# Patient Record
Sex: Female | Born: 1945 | Race: Black or African American | Hispanic: No | Marital: Married | Smoking: Never smoker
Health system: Southern US, Community
[De-identification: ages and names within clinical notes are randomized; demographics above are authoritative.]

## PROBLEM LIST (undated history)

## (undated) ENCOUNTER — Ambulatory Visit: Admission: EM | Payer: Self-pay | Source: Home / Self Care

## (undated) DIAGNOSIS — F32A Depression, unspecified: Secondary | ICD-10-CM

## (undated) DIAGNOSIS — M329 Systemic lupus erythematosus, unspecified: Secondary | ICD-10-CM

## (undated) DIAGNOSIS — E785 Hyperlipidemia, unspecified: Secondary | ICD-10-CM

## (undated) DIAGNOSIS — IMO0002 Reserved for concepts with insufficient information to code with codable children: Secondary | ICD-10-CM

## (undated) DIAGNOSIS — H3320 Serous retinal detachment, unspecified eye: Secondary | ICD-10-CM

## (undated) DIAGNOSIS — H3560 Retinal hemorrhage, unspecified eye: Secondary | ICD-10-CM

## (undated) DIAGNOSIS — I10 Essential (primary) hypertension: Secondary | ICD-10-CM

## (undated) DIAGNOSIS — K5792 Diverticulitis of intestine, part unspecified, without perforation or abscess without bleeding: Secondary | ICD-10-CM

## (undated) DIAGNOSIS — F329 Major depressive disorder, single episode, unspecified: Secondary | ICD-10-CM

## (undated) DIAGNOSIS — F419 Anxiety disorder, unspecified: Secondary | ICD-10-CM

## (undated) DIAGNOSIS — S82899A Other fracture of unspecified lower leg, initial encounter for closed fracture: Secondary | ICD-10-CM

## (undated) HISTORY — DX: Other fracture of unspecified lower leg, initial encounter for closed fracture: S82.899A

## (undated) HISTORY — PX: EYE SURGERY: SHX253

## (undated) HISTORY — PX: ABDOMINAL HYSTERECTOMY: SHX81

## (undated) HISTORY — PX: RETINAL DETACHMENT SURGERY: SHX105

## (undated) HISTORY — DX: Depression, unspecified: F32.A

## (undated) HISTORY — PX: CATARACT EXTRACTION: SUR2

## (undated) HISTORY — PX: BREAST SURGERY: SHX581

## (undated) HISTORY — PX: BREAST BIOPSY: SHX20

## (undated) HISTORY — DX: Systemic lupus erythematosus, unspecified: M32.9

## (undated) HISTORY — DX: Retinal hemorrhage, unspecified eye: H35.60

## (undated) HISTORY — DX: Serous retinal detachment, unspecified eye: H33.20

## (undated) HISTORY — DX: Reserved for concepts with insufficient information to code with codable children: IMO0002

## (undated) HISTORY — DX: Major depressive disorder, single episode, unspecified: F32.9

## (undated) HISTORY — DX: Anxiety disorder, unspecified: F41.9

## (undated) HISTORY — PX: HERNIA REPAIR: SHX51

---

## 2008-11-13 LAB — HEMOCCULT SLIDES (X 3 CARDS)
OCCULT 1: NEGATIVE
OCCULT 2: NEGATIVE

## 2014-05-08 LAB — HM COLONOSCOPY

## 2016-05-20 LAB — HM MAMMOGRAPHY

## 2016-06-07 ENCOUNTER — Encounter: Payer: Self-pay | Admitting: Adult Health

## 2016-06-08 LAB — HEPATIC FUNCTION PANEL
ALK PHOS: 86 (ref 25–125)
ALT: 16 (ref 7–35)
AST: 20 (ref 13–35)
BILIRUBIN, TOTAL: 0.3

## 2016-06-08 LAB — BASIC METABOLIC PANEL
BUN: 18 (ref 4–21)
CREATININE: 1.1 (ref 0.5–1.1)
GLUCOSE: 91
Potassium: 3.7 (ref 3.4–5.3)
SODIUM: 139 (ref 137–147)

## 2016-06-08 LAB — LIPID PANEL
Cholesterol: 235 — AB (ref 0–200)
HDL: 56 (ref 35–70)
LDL Cholesterol: 152
TRIGLYCERIDES: 134 (ref 40–160)

## 2016-09-16 LAB — COMPLETE METABOLIC PANEL WITH GFR
ALK PHOS: 89
ALT: 19
AST: 34
BUN: 23
CREATININE: 1.26
Glucose: 104
POTASSIUM: 4.6
SODIUM: 139
Total Bilirubin: 0.4

## 2016-09-16 LAB — LIPID PANEL
CHOLESTEROL, TOTAL: 243
HDL: 60
LDL: 160
Triglyceride fasting, serum: 115

## 2017-04-19 NOTE — Progress Notes (Deleted)
   Subjective:    Patient ID: Dawn Cox, female    DOB: [birth year], 72 y.o.   MRN: [id number]  HPI:  [name] is here to establish as a new pt.  She is a pleasant 72 year old female.  PMH:     Review of Systems     Objective:   Physical Exam        Assessment & Plan:

## 2017-04-21 ENCOUNTER — Ambulatory Visit: Payer: Self-pay | Admitting: Adult Health

## 2017-04-30 ENCOUNTER — Emergency Department (HOSPITAL_COMMUNITY)
Admission: EM | Admit: 2017-04-30 | Discharge: 2017-05-01 | Disposition: A | Discharge status: Home / Self Care | Payer: Federal, State, Local not specified - PPO | Attending: Emergency Medicine | Admitting: Emergency Medicine

## 2017-04-30 ENCOUNTER — Encounter (HOSPITAL_COMMUNITY): Payer: Self-pay | Admitting: Nurse Practitioner

## 2017-04-30 DIAGNOSIS — Z5321 Procedure and treatment not carried out due to patient leaving prior to being seen by health care provider: Secondary | ICD-10-CM | POA: Diagnosis not present

## 2017-04-30 DIAGNOSIS — R51 Headache: Secondary | ICD-10-CM | POA: Insufficient documentation

## 2017-04-30 HISTORY — DX: Diverticulitis of intestine, part unspecified, without perforation or abscess without bleeding: K57.92

## 2017-04-30 HISTORY — DX: Essential (primary) hypertension: I10

## 2017-04-30 HISTORY — DX: Hyperlipidemia, unspecified: E78.5

## 2017-04-30 NOTE — ED Notes (Signed)
Pt. Called x 2 from triage for bed in the back.

## 2017-04-30 NOTE — ED Triage Notes (Signed)
Pt is c/o URI Sx in addition to HA, body aches, intermittent fever and chills. Requesting to evaluated for flu.

## 2017-05-01 LAB — RESPIRATORY PANEL BY PCR
Adenovirus: NOT DETECTED
Bordetella pertussis: NOT DETECTED
CHLAMYDOPHILA PNEUMONIAE-RVPPCR: NOT DETECTED
CORONAVIRUS NL63-RVPPCR: DETECTED — AB
Coronavirus 229E: NOT DETECTED
Coronavirus HKU1: NOT DETECTED
Coronavirus OC43: NOT DETECTED
INFLUENZA A H1-RVPPCR: NOT DETECTED
INFLUENZA B-RVPPCR: NOT DETECTED
Influenza A H1 2009: NOT DETECTED
Influenza A H3: NOT DETECTED
Influenza A: NOT DETECTED
Metapneumovirus: NOT DETECTED
Mycoplasma pneumoniae: NOT DETECTED
PARAINFLUENZA VIRUS 3-RVPPCR: NOT DETECTED
PARAINFLUENZA VIRUS 4-RVPPCR: NOT DETECTED
Parainfluenza Virus 1: NOT DETECTED
Parainfluenza Virus 2: NOT DETECTED
RESPIRATORY SYNCYTIAL VIRUS-RVPPCR: NOT DETECTED
RHINOVIRUS / ENTEROVIRUS - RVPPCR: NOT DETECTED

## 2017-05-04 ENCOUNTER — Encounter (HOSPITAL_COMMUNITY): Payer: Self-pay | Admitting: Emergency Medicine

## 2017-05-04 ENCOUNTER — Emergency Department (HOSPITAL_COMMUNITY)
Admission: EM | Admit: 2017-05-04 | Discharge: 2017-05-04 | Disposition: A | Discharge status: Home / Self Care | Payer: Federal, State, Local not specified - PPO | Attending: Emergency Medicine | Admitting: Emergency Medicine

## 2017-05-04 ENCOUNTER — Other Ambulatory Visit: Payer: Self-pay

## 2017-05-04 ENCOUNTER — Emergency Department (HOSPITAL_COMMUNITY): Payer: Federal, State, Local not specified - PPO

## 2017-05-04 DIAGNOSIS — R0789 Other chest pain: Secondary | ICD-10-CM | POA: Diagnosis not present

## 2017-05-04 DIAGNOSIS — R51 Headache: Secondary | ICD-10-CM | POA: Insufficient documentation

## 2017-05-04 DIAGNOSIS — Z79899 Other long term (current) drug therapy: Secondary | ICD-10-CM | POA: Insufficient documentation

## 2017-05-04 DIAGNOSIS — Z91040 Latex allergy status: Secondary | ICD-10-CM | POA: Diagnosis not present

## 2017-05-04 DIAGNOSIS — R0982 Postnasal drip: Secondary | ICD-10-CM | POA: Diagnosis not present

## 2017-05-04 DIAGNOSIS — R05 Cough: Secondary | ICD-10-CM | POA: Diagnosis present

## 2017-05-04 DIAGNOSIS — R6883 Chills (without fever): Secondary | ICD-10-CM | POA: Diagnosis not present

## 2017-05-04 DIAGNOSIS — R0981 Nasal congestion: Secondary | ICD-10-CM | POA: Insufficient documentation

## 2017-05-04 DIAGNOSIS — J209 Acute bronchitis, unspecified: Secondary | ICD-10-CM | POA: Insufficient documentation

## 2017-05-04 DIAGNOSIS — I10 Essential (primary) hypertension: Secondary | ICD-10-CM | POA: Diagnosis not present

## 2017-05-04 LAB — BASIC METABOLIC PANEL
Anion gap: 9 (ref 5–15)
BUN: 20 mg/dL (ref 6–20)
CALCIUM: 9 mg/dL (ref 8.9–10.3)
CO2: 26 mmol/L (ref 22–32)
CREATININE: 1.24 mg/dL — AB (ref 0.44–1.00)
Chloride: 105 mmol/L (ref 101–111)
GFR calc Af Amer: 49 mL/min — ABNORMAL LOW (ref >60)
GFR, EST NON AFRICAN AMERICAN: 43 mL/min — AB (ref >60)
Glucose, Bld: 104 mg/dL — ABNORMAL HIGH (ref 65–99)
POTASSIUM: 4.8 mmol/L (ref 3.5–5.1)
SODIUM: 140 mmol/L (ref 135–145)

## 2017-05-04 LAB — CBC WITH DIFFERENTIAL/PLATELET
BASOS ABS: 0 10*3/uL (ref 0.0–0.1)
BASOS PCT: 1 %
EOS ABS: 0.1 10*3/uL (ref 0.0–0.7)
EOS PCT: 2 %
HCT: 34.7 % — ABNORMAL LOW (ref 36.0–46.0)
Hemoglobin: 11.5 g/dL — ABNORMAL LOW (ref 12.0–15.0)
LYMPHS PCT: 60 %
Lymphs Abs: 2.8 10*3/uL (ref 0.7–4.0)
MCH: 28.8 pg (ref 26.0–34.0)
MCHC: 33.1 g/dL (ref 30.0–36.0)
MCV: 87 fL (ref 78.0–100.0)
MONO ABS: 0.3 10*3/uL (ref 0.1–1.0)
Monocytes Relative: 7 %
Neutro Abs: 1.4 10*3/uL — ABNORMAL LOW (ref 1.7–7.7)
Neutrophils Relative %: 30 %
PLATELETS: 244 10*3/uL (ref 150–400)
RBC: 3.99 MIL/uL (ref 3.87–5.11)
RDW: 14.2 % (ref 11.5–15.5)
WBC: 4.6 10*3/uL (ref 4.0–10.5)

## 2017-05-04 MED ORDER — DM-GUAIFENESIN ER 30-600 MG PO TB12: 1.0000 | ORAL_TABLET | Freq: Two times a day (BID) | ORAL | 0 refills | Status: AC

## 2017-05-04 MED ORDER — FLUTICASONE PROPIONATE 50 MCG/ACT NA SUSP: 1.0000 | Freq: Every day | NASAL | 2 refills | Status: DC

## 2017-05-04 MED ORDER — AEROCHAMBER PLUS FLO-VU MEDIUM MISC
1.0000 | Freq: Once | Status: AC
  Administered 2017-05-04: 1
  Filled 2017-05-04: qty 1

## 2017-05-04 MED ORDER — ACETAMINOPHEN 325 MG PO TABS
650.0000 mg | ORAL_TABLET | Freq: Once | ORAL | Status: AC
  Administered 2017-05-04: 650 mg via ORAL
  Filled 2017-05-04: qty 2

## 2017-05-04 MED ORDER — ALBUTEROL SULFATE HFA 108 (90 BASE) MCG/ACT IN AERS
1.0000 | INHALATION_SPRAY | Freq: Once | RESPIRATORY_TRACT | Status: AC
  Administered 2017-05-04: 1 via RESPIRATORY_TRACT
  Filled 2017-05-04: qty 6.7

## 2017-05-04 NOTE — ED Notes (Signed)
Patient transported to X-ray 

## 2017-05-04 NOTE — ED Triage Notes (Signed)
Patient presents ambulatory c/o cough and chills for past 2 weeks. States she came last week but left due to wait time. States symptoms have not changed. speaking in full sentences.

## 2017-05-04 NOTE — Discharge Instructions (Signed)
Please read and follow all provided instructions.  Your diagnoses today include:  1. Acute bronchitis, unspecified organism     You appear to have an upper respiratory infection (URI). An upper respiratory tract infection, or cold, is a viral infection of the air passages leading to the lungs. It should improve gradually after 5-7 days. You may have a lingering cough that lasts for 2- 4 weeks after the infection.  Tests performed today include: Vital signs. See below for your results today.   Medications prescribed:   Take any prescribed medications only as directed. Treatment for your infection is aimed at treating the symptoms. There are no medications, such as antibiotics, that will cure your infection.   Please use the albuterol inhaler with the spacer, 1 puff as needed for shortness of breath with coughing.  If needing this multiple times within the space of 20 minutes, please return for reevaluation.  Mucinex DM.  This is a combination cough suppressant and medicine to help break up the mucus in your sinuses and lungs.  Flonase.  This is a nasal steroid.  Please call the administration instructions in this packet including pointing away from the nasal septum.  Do not use more than once a day.  Home care instructions:  Follow any educational materials contained in this packet.   Your illness is contagious and can be spread to others, especially during the first 3 or 4 days. It cannot be cured by antibiotics or other medicines. Take basic precautions such as washing your hands often, covering your mouth when you cough or sneeze, and avoiding public places where you could spread your illness to others.   Please continue drinking plenty of fluids.  Use over-the-counter medicines as needed as directed on packaging for symptom relief.  You may also use ibuprofen or tylenol as directed on packaging for pain or fever.  Do not take multiple medicines containing Tylenol or acetaminophen to  avoid taking too much of this medication.  Follow-up instructions: Please follow-up with your primary care provider as soon as possible for further evaluation of your symptoms if you are not feeling better.   Return instructions:  Please return to the Emergency Department if you experience worsening symptoms.  RETURN IMMEDIATELY IF you develop shortness of breath, chest pain, confusion or altered mental status, a new rash, become dizzy, faint, or poorly responsive, or are unable to be cared for at home. Please return if you have persistent vomiting and cannot keep down fluids or develop a fever that is not controlled by tylenol or motrin.   Please return if you have any other emergent concerns.  Additional Information:  Your vital signs today were: BP 137/62    Pulse 77    Temp 98.3 F (36.8 C) (Oral)    Resp 16    SpO2 93%  If your blood pressure (BP) was elevated above 135/85 this visit, please have this repeated by your doctor within one month. --------------

## 2017-05-04 NOTE — ED Provider Notes (Signed)
[facility]-EMERGENCY DEPT Provider Note   CSN: [id number] Arrival date & time: 05/04/17  1242     History   Chief Complaint Chief Complaint  Patient presents with  . Cough    HPI Trudie ReedJohann Rotolo is a 72 y.o. female.  HPI  Patient is a 72 year old female with a history of hypertension, hyperlipidemia, and IBS presenting for productive cough of sputum, myalgias, and frontal headache for 2 weeks.  Patient reports that this began with sinus congestion and rhinorrhea and progressed into postnasal drip and cough productive of green or yellow sputum.  Patient denies hemoptysis.  Patient reports that her cough is particularly worse at night, and she also gets central chest pressure that relieves with coughing and does not radiate or persist, or occur with exertion.  Patient also reports that she has a frontal headache that is pressure-like in nature and extends ultimately roughly up her scalp.  Patient reports that she will wake up with it and it relieves with Allegra and aspirin.  Headache is not associated with visual changes occurring with headache, weakness, numbness.  It is not changed over the course of the 2 weeks, and patient reports that she has previously gotten similar headaches while ill.  Patient does not report this is worst headache of her life.  Patient has not taken any over-the-counter medications for her symptoms besides Allegra and aspirin.  Patient reports that she felt warm approximately 4 days ago, but has not recorded any fevers over the course of her illness.  Patient denies any shortness of breath, nausea, vomiting, abdominal pain.   Past Medical History:  Diagnosis Date  . Diverticulitis   . Hyperlipidemia   . Hypertension     There are no active problems to display for this patient.   Past Surgical History:  Procedure Laterality Date  . HERNIA REPAIR      OB History    No data available       Home Medications    Prior to Admission  medications   Medication Sig Start Date End Date Taking? Authorizing Provider  acetaminophen (TYLENOL) 500 MG tablet Take 1,000 mg by mouth daily.   Yes [provider]  amLODipine (NORVASC) 10 MG tablet Take 10 mg by mouth daily. 05/02/17  Yes [provider]  atorvastatin (LIPITOR) 20 MG tablet Take 20 mg by mouth daily. 04/06/17  Yes [provider]  FLUoxetine HCl 60 MG TABS Take 60 mg by mouth daily. 03/25/17  Yes [provider]  glycopyrrolate (ROBINUL) 2 MG tablet Take 2 mg by mouth daily as needed for pain. 04/16/17  Yes [provider]  hydroxychloroquine (PLAQUENIL) 200 MG tablet Take 200 mg by mouth 2 (two) times daily.   Yes [provider]  Turmeric 500 MG CAPS Take 1,000 mg by mouth daily.   Yes [provider]    Family History No family history on file.  Social History Social History   Tobacco Use  . Smoking status: Not on file  Substance Use Topics  . Alcohol use: Yes  . Drug use: No     Allergies   Latex; Other; and Shellfish allergy   Review of Systems Review of Systems  Constitutional: Positive for chills. Negative for fever.  HENT: Positive for congestion and rhinorrhea.   Eyes: Negative for discharge and visual disturbance.  Respiratory: Positive for cough. Negative for shortness of breath.   Cardiovascular: Positive for chest pain.       CP relieved  by coughing  Gastrointestinal: Negative for abdominal pain, diarrhea, nausea and vomiting.  Genitourinary: Negative for dysuria.  Musculoskeletal: Positive for arthralgias and myalgias.  Neurological: Negative for weakness and numbness.     Physical Exam Updated Vital Signs BP 137/62   Pulse 77   Temp 98.3 F (36.8 C) (Oral)   Resp 16   SpO2 93%   Physical Exam  Constitutional: She appears well-developed and well-nourished. No distress.  HENT:  Head: Normocephalic and atraumatic.  Mouth/Throat: Oropharynx is clear and moist.    Cobblestoning of the posterior pharynx. Tenderness to palpation of bilateral frontal sinuses.  Mild tenderness to palpation of bilateral maxillary sinuses.  Eyes: Conjunctivae and EOM are normal. Pupils are equal, round, and reactive to light.  Neck: Normal range of motion. Neck supple.  No nuchal rigidity.  Cardiovascular: Normal rate, regular rhythm, S1 normal and S2 normal.  No murmur heard. Pulmonary/Chest: Effort normal and breath sounds normal. She has no wheezes. She has no rales.  Abdominal: Soft. She exhibits no distension. There is no tenderness. There is no guarding.  Musculoskeletal: Normal range of motion. She exhibits no edema or deformity. Tenderness:    Lymphadenopathy:    She has no cervical adenopathy.  Neurological: She is alert.  Mental Status:  Alert, oriented, thought content appropriate, able to give a coherent history. Speech fluent without evidence of aphasia. Able to follow 2 step commands without difficulty.  Cranial Nerves:  II:  Peripheral visual fields grossly normal, pupils equal, round, reactive to light III,IV, VI: ptosis not present, extra-ocular motions intact bilaterally  V,VII: smile symmetric, facial light touch sensation equal VIII: hearing grossly normal to voice  X: uvula elevates symmetrically  XI: bilateral shoulder shrug symmetric and strong XII: midline tongue extension without fassiculations Motor:  Normal tone. 5/5 in upper and lower extremities bilaterally including strong and equal grip strength and dorsiflexion/plantar flexion Sensory: Light touch normal in all extremities.  Cerebellar: normal finger-to-nose with bilateral upper extremities Gait: normal gait and balance Stance: No pronator drift and good coordination, strength, and position sense with tapping of bilateral arms (performed in sitting position). CV: distal pulses palpable throughout   Skin: Skin is warm and dry. No rash noted. No erythema.  Psychiatric: She has a normal  mood and affect. Her behavior is normal. Judgment and thought content normal.  Nursing note and vitals reviewed.    ED Treatments / Results  Labs (all labs ordered are listed, but only abnormal results are displayed) Labs Reviewed  BASIC METABOLIC PANEL  CBC WITH DIFFERENTIAL/PLATELET    EKG  EKG Interpretation None       Radiology Dg Chest 2 View  Result Date: 05/04/2017 CLINICAL DATA:  Cough, chills, mid chest pain for 2 weeks EXAM: CHEST  2 VIEW COMPARISON:  None. FINDINGS: The heart size and mediastinal contours are within normal limits. Both lungs are clear. The visualized skeletal structures are unremarkable. IMPRESSION: No active cardiopulmonary disease. Electronically Signed   By: Elige Ko   On: 05/04/2017 13:13    Procedures Procedures (including critical care time)  Medications Ordered in ED Medications  acetaminophen (TYLENOL) tablet 650 mg (650 mg Oral Given 05/04/17 1350)     Initial Impression / Assessment and Plan / ED Course  I have reviewed the triage vital signs and the nursing notes.  Pertinent labs & imaging results that were available during my care of the patient were reviewed by me and considered in my medical decision making (see chart for details).  Final Clinical Impressions(s) / ED Diagnoses   Final diagnoses:  Acute bronchitis, unspecified organism   Patient is nontoxic-appearing, afebrile at this time, and in no acute distress.  Patient exhibits a viral syndrome, likely bronchitis.  Patient had a respiratory panel performed 4 days ago, which was positive for coronavirus.  No influenza identified.  Chest x-ray, reviewed by me, reveals no infiltrate or other cardiopulmonary disease.  Neurologic examination as well as historical features not concerning for red flags of headache.  There is no nuchal rigidity, worst headache of patient's life, history of cancer, or focal neurologic deficit.  Patient also reporting that these headaches  are consistent with prior headaches when she is ill.  They have remained unchanged, and likely due to sinus congestion. Will obtain CBC and BMP.   Hemoglobin 11.9.  No baseline for comparison.  Additionally, creatinine is 1.24.  No baseline.  Will treat patient symptomatically with albuterol inhaler with spacer, Flonase, and Mucinex DM.  Will encourage follow-up as well as return precautions for any worsening chest pain, shortness of breath, fevers, dizziness, lightheadedness, or presyncope.  Patient is in understanding agrees with plan of care.  This is a shared visit with Dr. Donnetta Hutching. Patient was independently evaluated by this attending physician. Attending physician consulted in evaluation and discharge management.  ED Discharge Orders    None       Delia Chimes 05/04/17 1530    Donnetta Hutching, MD 05/05/17 Corky Crafts

## 2017-05-25 ENCOUNTER — Ambulatory Visit: Payer: Federal, State, Local not specified - PPO | Admitting: Adult Health

## 2017-05-25 ENCOUNTER — Other Ambulatory Visit: Payer: Self-pay | Admitting: Adult Health

## 2017-05-25 ENCOUNTER — Encounter: Payer: Self-pay | Admitting: Adult Health

## 2017-05-25 VITALS — BP 116/73 | HR 85 | Ht 65.0 in | Wt 207.3 lb

## 2017-05-25 DIAGNOSIS — K5792 Diverticulitis of intestine, part unspecified, without perforation or abscess without bleeding: Secondary | ICD-10-CM | POA: Diagnosis not present

## 2017-05-25 DIAGNOSIS — I2510 Atherosclerotic heart disease of native coronary artery without angina pectoris: Secondary | ICD-10-CM | POA: Insufficient documentation

## 2017-05-25 DIAGNOSIS — Z1239 Encounter for other screening for malignant neoplasm of breast: Secondary | ICD-10-CM

## 2017-05-25 DIAGNOSIS — Z0000 Encounter for general adult medical examination without abnormal findings: Secondary | ICD-10-CM | POA: Insufficient documentation

## 2017-05-25 DIAGNOSIS — I10 Essential (primary) hypertension: Secondary | ICD-10-CM | POA: Diagnosis not present

## 2017-05-25 DIAGNOSIS — E1169 Type 2 diabetes mellitus with other specified complication: Secondary | ICD-10-CM | POA: Insufficient documentation

## 2017-05-25 DIAGNOSIS — Z8739 Personal history of other diseases of the musculoskeletal system and connective tissue: Secondary | ICD-10-CM

## 2017-05-25 DIAGNOSIS — Z1231 Encounter for screening mammogram for malignant neoplasm of breast: Secondary | ICD-10-CM

## 2017-05-25 DIAGNOSIS — R52 Pain, unspecified: Secondary | ICD-10-CM | POA: Diagnosis not present

## 2017-05-25 DIAGNOSIS — E785 Hyperlipidemia, unspecified: Secondary | ICD-10-CM | POA: Diagnosis not present

## 2017-05-25 DIAGNOSIS — R5383 Other fatigue: Secondary | ICD-10-CM | POA: Insufficient documentation

## 2017-05-25 DIAGNOSIS — E7800 Pure hypercholesterolemia, unspecified: Secondary | ICD-10-CM | POA: Insufficient documentation

## 2017-05-25 DIAGNOSIS — M329 Systemic lupus erythematosus, unspecified: Secondary | ICD-10-CM

## 2017-05-25 NOTE — Assessment & Plan Note (Signed)
Will draw fasting labs ASAP Currently taking Atorvastatin 20mg  QD

## 2017-05-25 NOTE — Assessment & Plan Note (Signed)
Mammogram order placed

## 2017-05-25 NOTE — Assessment & Plan Note (Signed)
Please continue all medications as directed. Continue excellent water intake and heart healthy diet. Flu Test - Neg Increase regular walking when you are feeling better. Referrals placed to Gastroenterologist and Rheumatologist. Please schedule complete physical with fasting labs in the next few weeks.

## 2017-05-25 NOTE — Assessment & Plan Note (Signed)
Flu test Neg 

## 2017-05-25 NOTE — Patient Instructions (Signed)
Heart-Healthy Eating Plan Heart-healthy meal planning includes:  Limiting unhealthy fats.  Increasing healthy fats.  Making other small dietary changes.  You may need to talk with your doctor or a diet specialist (dietitian) to create an eating plan that is right for you. What types of fat should I choose?  Choose healthy fats. These include olive oil and canola oil, flaxseeds, walnuts, almonds, and seeds.  Eat more omega-3 fats. These include salmon, mackerel, sardines, tuna, flaxseed oil, and ground flaxseeds. Try to eat fish at least twice each week.  Limit saturated fats. ? Saturated fats are often found in animal products, such as meats, butter, and cream. ? Plant sources of saturated fats include palm oil, palm kernel oil, and coconut oil.  Avoid foods with partially hydrogenated oils in them. These include stick margarine, some tub margarines, cookies, crackers, and other baked goods. These contain trans fats. What general guidelines do I need to follow?  Check food labels carefully. Identify foods with trans fats or high amounts of saturated fat.  Fill one half of your plate with vegetables and green salads. Eat 4-5 servings of vegetables per day. A serving of vegetables is: ? 1 cup of raw leafy vegetables. ?  cup of raw or cooked cut-up vegetables. ?  cup of vegetable juice.  Fill one fourth of your plate with whole grains. Look for the word "whole" as the first word in the ingredient list.  Fill one fourth of your plate with lean protein foods.  Eat 4-5 servings of fruit per day. A serving of fruit is: ? One medium whole fruit. ?  cup of dried fruit. ?  cup of fresh, frozen, or canned fruit. ?  cup of 100% fruit juice.  Eat more foods that contain soluble fiber. These include apples, broccoli, carrots, beans, peas, and barley. Try to get 20-30 g of fiber per day.  Eat more home-cooked food. Eat less restaurant, buffet, and fast food.  Limit or avoid  alcohol.  Limit foods high in starch and sugar.  Avoid fried foods.  Avoid frying your food. Try baking, boiling, grilling, or broiling it instead. You can also reduce fat by: ? Removing the skin from poultry. ? Removing all visible fats from meats. ? Skimming the fat off of stews, soups, and gravies before serving them. ? Steaming vegetables in water or broth.  Lose weight if you are overweight.  Eat 4-5 servings of nuts, legumes, and seeds per week: ? One serving of dried beans or legumes equals  cup after being cooked. ? One serving of nuts equals 1 ounces. ? One serving of seeds equals  ounce or one tablespoon.  You may need to keep track of how much salt or sodium you eat. This is especially true if you have high blood pressure. Talk with your doctor or dietitian to get more information. What foods can I eat? Grains Breads, including French, white, pita, wheat, raisin, rye, oatmeal, and Italian. Tortillas that are neither fried nor made with lard or trans fat. Low-fat rolls, including hotdog and hamburger buns and English muffins. Biscuits. Muffins. Waffles. Pancakes. Light popcorn. Whole-grain cereals. Flatbread. Melba toast. Pretzels. Breadsticks. Rusks. Low-fat snacks. Low-fat crackers, including oyster, saltine, matzo, graham, animal, and rye. Rice and pasta, including brown rice and pastas that are made with whole wheat. Vegetables All vegetables. Fruits All fruits, but limit coconut. Meats and Other Protein Sources Lean, well-trimmed beef, veal, pork, and lamb. Chicken and turkey without skin. All fish and shellfish.   Wild duck, rabbit, pheasant, and venison. Egg whites or low-cholesterol egg substitutes. Dried beans, peas, lentils, and tofu. Seeds and most nuts. Dairy Low-fat or nonfat cheeses, including ricotta, string, and mozzarella. Skim or 1% milk that is liquid, powdered, or evaporated. Buttermilk that is made with low-fat milk. Nonfat or low-fat  yogurt. Beverages Mineral water. Diet carbonated beverages. Sweets and Desserts Sherbets and fruit ices. Honey, jam, marmalade, jelly, and syrups. Meringues and gelatins. Pure sugar candy, such as hard candy, jelly beans, gumdrops, mints, marshmallows, and small amounts of dark chocolate. [employer] food cake. Eat all sweets and desserts in moderation. Fats and Oils Nonhydrogenated (trans-free) margarines. Vegetable oils, including soybean, sesame, sunflower, olive, peanut, safflower, corn, canola, and cottonseed. Salad dressings or mayonnaise made with a vegetable oil. Limit added fats and oils that you use for cooking, baking, salads, and as spreads. Other Cocoa powder. Coffee and tea. All seasonings and condiments. The items listed above may not be a complete list of recommended foods or beverages. Contact your dietitian for more options. What foods are not recommended? Grains Breads that are made with saturated or trans fats, oils, or whole milk. Croissants. Butter rolls. Cheese breads. Sweet rolls. Donuts. Buttered popcorn. Chow mein noodles. High-fat crackers, such as cheese or butter crackers. Meats and Other Protein Sources Fatty meats, such as hotdogs, short ribs, sausage, spareribs, bacon, rib eye roast or steak, and mutton. High-fat deli meats, such as salami and bologna. Caviar. Domestic duck and goose. Organ meats, such as kidney, liver, sweetbreads, and heart. Dairy Cream, sour cream, cream cheese, and creamed cottage cheese. Whole-milk cheeses, including blue (bleu), [address], [place]rie, [place], [address], [place], [address], cheddar, [place], and [place]. Whole or 2% milk that is liquid, evaporated, or condensed. Whole buttermilk. Cream sauce or high-fat cheese sauce. Yogurt that is made from whole milk. Beverages Regular sodas and juice drinks with added sugar. Sweets and Desserts Frosting. Pudding. Cookies. Cakes other than angel food cake. Candy that has milk chocolate or white  chocolate, hydrogenated fat, butter, coconut, or unknown ingredients. Buttered syrups. Full-fat ice cream or ice cream drinks. Fats and Oils Gravy that has suet, meat fat, or shortening. Cocoa butter, hydrogenated oils, palm oil, coconut oil, palm kernel oil. These can often be found in baked products, candy, fried foods, nondairy creamers, and whipped toppings. Solid fats and shortenings, including bacon fat, salt pork, lard, and butter. Nondairy cream substitutes, such as coffee creamers and sour cream substitutes. Salad dressings that are made of unknown oils, cheese, or sour cream. The items listed above may not be a complete list of foods and beverages to avoid. Contact your dietitian for more information. This information is not intended to replace advice given to you by your health care provider. Make sure you discuss any questions you have with your health care provider. Document Released: 09/07/2011 Document Revised: 08/14/2015 Document Reviewed: 08/30/2013 Elsevier Interactive Patient Education  [employer]2018 [employer]   Diverticulitis Diverticulitis is infection or inflammation of small pouches (diverticula) in the colon that form due to a condition called diverticulosis. Diverticula can trap stool (feces) and bacteria, causing infection and inflammation. Diverticulitis may cause severe stomach pain and diarrhea. It may lead to tissue damage in the colon that causes bleeding. The diverticula may also burst (rupture) and cause infected stool to enter other areas of the abdomen. Complications of diverticulitis can include:  Bleeding.  Severe infection.  Severe pain.  Rupture (perforation) of the colon.  Blockage (obstruction) of the colon.  What are the  causes? This condition is caused by stool becoming trapped in the diverticula, which allows bacteria to grow in the diverticula. This leads to inflammation and infection. What increases the risk? You are more likely to develop this  condition if:  You have diverticulosis. The risk for diverticulosis increases if: ? You are overweight or obese. ? You use tobacco products. ? You do not get enough exercise.  You eat a diet that does not include enough fiber. High-fiber foods include fruits, vegetables, beans, nuts, and whole grains.  What are the signs or symptoms? Symptoms of this condition may include:  Pain and tenderness in the abdomen. The pain is normally located on the left side of the abdomen, but it may occur in other areas.  Fever and chills.  Bloating.  Cramping.  Nausea.  Vomiting.  Changes in bowel routines.  Blood in your stool.  How is this diagnosed? This condition is diagnosed based on:  Your medical history.  A physical exam.  Tests to make sure there is nothing else causing your condition. These tests may include: ? Blood tests. ? Urine tests. ? Imaging tests of the abdomen, including X-rays, ultrasounds, MRIs, or CT scans.  How is this treated? Most cases of this condition are mild and can be treated at home. Treatment may include:  Taking over-the-counter pain medicines.  Following a clear liquid diet.  Taking antibiotic medicines by mouth.  Rest.  More severe cases may need to be treated at a hospital. Treatment may include:  Not eating or drinking.  Taking prescription pain medicine.  Receiving antibiotic medicines through an IV tube.  Receiving fluids and nutrition through an IV tube.  Surgery.  When your condition is under control, your health care provider may recommend that you have a colonoscopy. This is an exam to look at the entire large intestine. During the exam, a lubricated, bendable tube is inserted into the anus and then passed into the rectum, colon, and other parts of the large intestine. A colonoscopy can show how severe your diverticula are and whether something else may be causing your symptoms. Follow these instructions at  home: Medicines  Take over-the-counter and prescription medicines only as told by your health care provider. These include fiber supplements, probiotics, and stool softeners.  If you were prescribed an antibiotic medicine, take it as told by your health care provider. Do not stop taking the antibiotic even if you start to feel better.  Do not drive or use heavy machinery while taking prescription pain medicine. General instructions  Follow a full liquid diet or another diet as directed by your health care provider. After your symptoms improve, your health care provider may tell you to change your diet. He or she may recommend that you eat a diet that contains at least 25 g (25 grams) of fiber daily. Fiber makes it easier to pass stool. Healthy sources of fiber include: ? Berries. One cup contains 4-8 grams of fiber. ? Beans or lentils. One half cup contains 5-8 grams of fiber. ? Green vegetables. One cup contains 4 grams of fiber.  Exercise for at least 30 minutes, 3 times each week. You should exercise hard enough to raise your heart rate and break a sweat.  Keep all follow-up visits as told by your health care provider. This is important. You may need a colonoscopy. Contact a health care provider if:  Your pain does not improve.  You have a hard time drinking or eating food.  Your bowel  movements do not return to normal. Get help right away if:  Your pain gets worse.  Your symptoms do not get better with treatment.  Your symptoms suddenly get worse.  You have a fever.  You vomit more than one time.  You have stools that are bloody, black, or tarry. Summary  Diverticulitis is infection or inflammation of small pouches (diverticula) in the colon that form due to a condition called diverticulosis. Diverticula can trap stool (feces) and bacteria, causing infection and inflammation.  You are at higher risk for this condition if you have diverticulosis and you eat a diet that  does not include enough fiber.  Most cases of this condition are mild and can be treated at home. More severe cases may need to be treated at a hospital.  When your condition is under control, your health care provider may recommend that you have an exam called a colonoscopy. This exam can show how severe your diverticula are and whether something else may be causing your symptoms. This information is not intended to replace advice given to you by your health care provider. Make sure you discuss any questions you have with your health care provider. Document Released: 12/16/2004 Document Revised: 04/10/2016 Document Reviewed: 04/10/2016 Elsevier Interactive Patient Education  [employer].  Please continue all medications as directed. Continue excellent water intake and heart healthy diet. Flu Test - Neg Increase regular walking when you are feeling better. Referrals placed to Gastroenterologist and Rheumatologist. Please schedule complete physical with fasting labs in the next few weeks. WELCOME TO THE PRACTICE!

## 2017-05-25 NOTE — Assessment & Plan Note (Signed)
GI referral placed Glyopyrrolate 2mg  PRN

## 2017-05-25 NOTE — Assessment & Plan Note (Addendum)
Referral to Rheumatology placed Currently taking hydroxychloroquine 200mg  BID- denies med SE

## 2017-05-25 NOTE — Assessment & Plan Note (Signed)
BP at goal 116/73, HR 85 Continue amlodipine  QD

## 2017-05-25 NOTE — Progress Notes (Addendum)
Subjective:    Patient ID: Dawn Cox, female    DOB: 1946/01/05, 72 y.o.   MRN: [id number]  HPI: [name] presents to establish as a new pt. She is a pleasant 72 year old female. PMH: HTN, HLD, Diverticulitis, and recently dx'd with Lupus last year and started on hydroxycholoquine 200mg  BID, denies med SE. She does not have local Rheumatologist. She reports diarrhea twice a week and denies blood in urine/stool.  She feels that diverticulitis flares up 1-2 times/year and uses glycopyrrolate 2mg  PRN. Per pt her last colonoscopy (2017?) was normal and she is recommended to repeat every 5 years, she does not have local GI. She reports body aches, fatigue, non-productive cough, and clear nasal congestion that last 2 days. She denies tobacco/ETOH use  Patient Care Team    Relationship Specialty Notifications Start End  Julaine Fusi, NP PCP - General Family Medicine  05/25/17     Patient Active Problem List   Diagnosis Date Noted  . Diverticulitis 05/25/2017  . Hx of systemic lupus erythematosus (SLE) 05/25/2017  . Other fatigue 05/25/2017  . Hypertension 05/25/2017  . Hyperlipidemia 05/25/2017  . Healthcare maintenance 05/25/2017  . Screening for breast cancer 05/25/2017  . Body aches 05/25/2017     Past Medical History:  Diagnosis Date  . Anxiety   . Depression   . Diverticulitis   . Hyperlipidemia   . Hypertension   . Lupus   . Retinal detachment   . Retinal hemorrhage      Past Surgical History:  Procedure Laterality Date  . ABDOMINAL HYSTERECTOMY    . BREAST SURGERY Right    cyst removal  . CATARACT EXTRACTION Bilateral   . EYE SURGERY Right    hemorrhagia of retina  . HERNIA REPAIR    . RETINAL DETACHMENT SURGERY       Family History  Problem Relation Age of Onset  . Heart attack Mother   . Liver cancer Father   . Alcohol abuse Daughter   . Depression Daughter      Social History   Substance and Sexual Activity  Drug Use No     Social  History   Substance and Sexual Activity  Alcohol Use No  . Frequency: Never     Social History   Tobacco Use  Smoking Status Never Smoker  Smokeless Tobacco Never Used     Outpatient Encounter Medications as of 05/25/2017  Medication Sig  . albuterol (PROVENTIL HFA;VENTOLIN HFA) 108 (90 Base) MCG/ACT inhaler Inhale 1 puff into the lungs every 6 (six) hours as needed for wheezing or shortness of breath.  Marland Kitchen amLODipine (NORVASC) 10 MG tablet Take 10 mg by mouth daily.  Marland Kitchen aspirin 325 MG tablet Take 325 mg by mouth every 4 (four) hours as needed.  Marland Kitchen atorvastatin (LIPITOR) 20 MG tablet Take 20 mg by mouth daily.  Marland Kitchen FLUoxetine (PROZAC) 20 MG tablet Take 20 mg by mouth daily.  . fluticasone (FLONASE) 50 MCG/ACT nasal spray Place 1 spray into both nostrils daily.  Marland Kitchen glycopyrrolate (ROBINUL) 2 MG tablet Take 2 mg by mouth daily as needed for pain.  . hydrocortisone 2.5 % cream Apply topically 2 (two) times daily.  . hydroxychloroquine (PLAQUENIL) 200 MG tablet Take 200 mg by mouth 2 (two) times daily.  . nitrofurantoin (MACRODANTIN) 100 MG capsule Take 100 mg by mouth 4 (four) times daily.  . Turmeric 500 MG CAPS Take 1,000 mg by mouth daily.  . [DISCONTINUED] acetaminophen (TYLENOL) 500 MG tablet  Take 1,000 mg by mouth daily.  . [DISCONTINUED] FLUoxetine HCl 60 MG TABS Take 60 mg by mouth daily.  . [DISCONTINUED] hydroxychloroquine (PLAQUENIL) 200 MG tablet Take 200 mg by mouth 2 (two) times daily.   No facility-administered encounter medications on file as of 05/25/2017.     Allergies: Aloe; Latex; Other; Shellfish allergy; and Vibramycin [doxycycline calcium]  Body mass index is 34.5 kg/m.  Blood pressure 116/73, pulse 85, height 5\' 5"  (1.651 m), weight 207 lb 4.8 oz (94 kg), SpO2 98 %.       Review of Systems  Constitutional: Positive for activity change, chills and fatigue. Negative for appetite change, diaphoresis, fever and unexpected weight change.  HENT: Positive for  congestion.   Eyes: Negative for visual disturbance.  Respiratory: Negative for cough, chest tightness, shortness of breath, wheezing and stridor.   Cardiovascular: Negative for chest pain, palpitations and leg swelling.  Gastrointestinal: Positive for diarrhea. Negative for abdominal distention, abdominal pain, blood in stool, constipation and nausea.  Genitourinary: Negative for difficulty urinating and flank pain.  Musculoskeletal: Positive for arthralgias, joint swelling, myalgias, neck pain and neck stiffness. Negative for back pain and gait problem.  Neurological: Positive for tremors. Negative for dizziness and headaches.  Hematological: Does not bruise/bleed easily.  Psychiatric/Behavioral: Negative for dysphoric mood, hallucinations, self-injury, sleep disturbance and suicidal ideas. The patient is not nervous/anxious and is not hyperactive.        Objective:   Physical Exam  Constitutional: She is oriented to person, place, and time. She appears well-developed and well-nourished. No distress.  Appears quite fatigued  HENT:  Head: Normocephalic and atraumatic.  Right Ear: External ear normal.  Left Ear: External ear normal.  Cardiovascular: Normal rate, regular rhythm and intact distal pulses.  Murmur heard. Pulmonary/Chest: Effort normal and breath sounds normal. No respiratory distress. She has no wheezes. She has no rales. She exhibits no tenderness.  Neurological: She is alert and oriented to person, place, and time.  Skin: Skin is warm and dry. No rash noted. She is not diaphoretic. No erythema. No pallor.  Psychiatric: She has a normal mood and affect. Her behavior is normal. Judgment and thought content normal.  Nursing note and vitals reviewed.         Assessment & Plan:   1. Screening for breast cancer   2. Healthcare maintenance   3. Hyperlipidemia, unspecified hyperlipidemia type   4. Hypertension, unspecified type   5. Other fatigue   6. Hx of systemic  lupus erythematosus (SLE)   7. Diverticulitis   8. Body aches     Healthcare maintenance Please continue all medications as directed. Continue excellent water intake and heart healthy diet. Flu Test - Neg Increase regular walking when you are feeling better. Referrals placed to Gastroenterologist and Rheumatologist. Please schedule complete physical with fasting labs in the next few weeks.  Hyperlipidemia Will draw fasting labs ASAP Currently taking Atorvastatin 20mg  QD   Body aches Flu test- Neg  Hypertension BP at goal 116/73, HR 85 Continue amlodipine 10mg  QD  Hx of systemic lupus erythematosus (SLE) Referral to Rheumatology placed Currently taking hydroxychloroquine 200mg  BID- denies med SE  Screening for breast cancer Mammogram order placed  Diverticulitis GI referral placed Glyopyrrolate 2mg  PRN    FOLLOW-UP:  Return in about 4 weeks (around 06/22/2017) for CPE, Fasting Labs.

## 2017-05-31 ENCOUNTER — Encounter: Payer: Self-pay | Admitting: Adult Health

## 2017-06-15 NOTE — Progress Notes (Signed)
Subjective:    Patient ID: Dawn Cox, female    DOB: 11/29/1945, 72 y.o.   MRN: [id number]  HPI:05/25/17 OV:  [name] presents to establish as a new pt. She is a pleasant 72 year old female. PMH: HTN, HLD, Diverticulitis, and recently dx'd with Lupus last year and started on hydroxycholoquine 200mg  BID, denies med SE. She does not have local Rheumatologist. She reports diarrhea twice a week and denies blood in urine/stool.  She feels that diverticulitis flares up 1-2 times/year and uses glycopyrrolate 2mg  PRN. Per pt her last colonoscopy (2017?) was normal and she is recommended to repeat every 5 years, she does not have local GI. She reports body aches, fatigue, non-productive cough, and clear nasal congestion that last 2 days. She denies tobacco/ETOH use  06/22/17 OV: [name] present for CPE. She reports medication compliance and denies SE She denies CP/dyspnea/palpitations, has had some dizziness but feels that it is r/t to her known vertigo. She developed a "rash" around her nose/mouth approx 3 weeks ago.  She denies pain/itching/drainage from area.  She denies this ever occurring before. She plans on increasing walking and calisthenics to at least 3 times/week. She has appt with new Rheumatologist next week.  Healthcare Maintenance: PAP-not indicated Mammogram- scheduled at end of month Colonoscopy- completed 05/08/2014 Immunizations-UTD   Patient Care Team    Relationship Specialty Notifications Start End  William Hamburger D, NP PCP - General Family Medicine  05/25/17   Crecencio Mc, MD Referring Physician Gastroenterology  05/31/17    Comment: Office address [address] [place], [place] [address]: phone [phone]    Patient Active Problem List   Diagnosis Date Noted  . Perioral dermatitis 06/22/2017  . Diverticulitis 05/25/2017  . Hx of systemic lupus erythematosus (SLE) (HCC) 05/25/2017  . Other fatigue 05/25/2017  . Hypertension 05/25/2017  . Hyperlipidemia  05/25/2017  . Healthcare maintenance 05/25/2017  . Screening for breast cancer 05/25/2017  . Body aches 05/25/2017     Past Medical History:  Diagnosis Date  . Anxiety   . Depression   . Diverticulitis   . Hyperlipidemia   . Hypertension   . Lupus (HCC)   . Retinal detachment   . Retinal hemorrhage      Past Surgical History:  Procedure Laterality Date  . ABDOMINAL HYSTERECTOMY    . BREAST SURGERY Right    cyst removal  . CATARACT EXTRACTION Bilateral   . EYE SURGERY Right    hemorrhagia of retina  . HERNIA REPAIR    . RETINAL DETACHMENT SURGERY       Family History  Problem Relation Age of Onset  . Heart attack Mother   . Liver cancer Father   . Alcohol abuse Daughter   . Depression Daughter      Social History   Substance and Sexual Activity  Drug Use No     Social History   Substance and Sexual Activity  Alcohol Use No  . Frequency: Never     Social History   Tobacco Use  Smoking Status Never Smoker  Smokeless Tobacco Never Used     Outpatient Encounter Medications as of 06/22/2017  Medication Sig  . albuterol (PROVENTIL HFA;VENTOLIN HFA) 108 (90 Base) MCG/ACT inhaler Inhale 1 puff into the lungs every 6 (six) hours as needed for wheezing or shortness of breath.  Marland Kitchen amLODipine (NORVASC) 10 MG tablet Take 10 mg by mouth daily.  Marland Kitchen aspirin 325 MG tablet Take 325 mg by mouth every 4 (four) hours  as needed.  Marland Kitchen. atorvastatin (LIPITOR) 20 MG tablet Take 20 mg by mouth daily.  Marland Kitchen. FLUoxetine HCl 60 MG TABS Take 1 tablet by mouth daily.  . fluticasone (FLONASE) 50 MCG/ACT nasal spray Place 1 spray into both nostrils daily.  Marland Kitchen. glycopyrrolate (ROBINUL) 2 MG tablet Take 2 mg by mouth daily as needed for pain.  . hydroxychloroquine (PLAQUENIL) 200 MG tablet Take 200 mg by mouth 2 (two) times daily.  . Turmeric 500 MG CAPS Take 1,000 mg by mouth daily.  Marland Kitchen. erythromycin with ethanol (EMGEL) 2 % gel Apply topically daily.  . [DISCONTINUED] FLUoxetine (PROZAC)  20 MG tablet Take 20 mg by mouth daily.  . [DISCONTINUED] hydrocortisone 2.5 % cream Apply topically 2 (two) times daily.  . [DISCONTINUED] nitrofurantoin (MACRODANTIN) 100 MG capsule Take 100 mg by mouth 4 (four) times daily.   No facility-administered encounter medications on file as of 06/22/2017.     Allergies: Aloe; Latex; Other; Shellfish allergy; and Vibramycin [doxycycline calcium]  Body mass index is 34.31 kg/m.  Blood pressure 105/65, pulse 73, height 5\' 5"  (1.651 m), weight 206 lb 3.2 oz (93.5 kg), SpO2 97 %.   Review of Systems  Constitutional: Positive for activity change and fatigue. Negative for appetite change, chills, diaphoresis, fever and unexpected weight change.  HENT: Negative for congestion.   Eyes: Negative for visual disturbance.  Respiratory: Negative for cough, chest tightness, shortness of breath, wheezing and stridor.   Cardiovascular: Negative for chest pain, palpitations and leg swelling.  Gastrointestinal: Negative for abdominal distention, abdominal pain, blood in stool, constipation, diarrhea and nausea.  Genitourinary: Negative for difficulty urinating and flank pain.  Musculoskeletal: Positive for arthralgias, joint swelling, myalgias, neck pain and neck stiffness. Negative for back pain and gait problem.  Neurological: Positive for tremors. Negative for dizziness and headaches.  Hematological: Does not bruise/bleed easily.  Psychiatric/Behavioral: Negative for dysphoric mood, hallucinations, self-injury, sleep disturbance and suicidal ideas. The patient is not nervous/anxious and is not hyperactive.        Objective:   Physical Exam  Constitutional: She is oriented to person, place, and time. She appears well-developed and well-nourished. No distress.  Appears quite fatigued  HENT:  Head: Normocephalic and atraumatic.  Right Ear: Hearing, tympanic membrane, external ear and ear canal normal. Tympanic membrane is not erythematous and not bulging.  No decreased hearing is noted.  Left Ear: Hearing, tympanic membrane, external ear and ear canal normal. Tympanic membrane is not erythematous and not bulging. No decreased hearing is noted.  Nose: No mucosal edema or rhinorrhea. Right sinus exhibits no maxillary sinus tenderness and no frontal sinus tenderness. Left sinus exhibits no maxillary sinus tenderness and no frontal sinus tenderness.  Mouth/Throat: Uvula is midline, oropharynx is clear and moist and mucous membranes are normal.  Eyes: Pupils are equal, round, and reactive to light.  Neck: Normal range of motion. Neck supple.  Cardiovascular: Normal rate, regular rhythm and intact distal pulses.  Murmur heard. Pulmonary/Chest: Effort normal and breath sounds normal. No respiratory distress. She has no decreased breath sounds. She has no wheezes. She has no rhonchi. She has no rales. She exhibits no tenderness. Right breast exhibits no inverted nipple, no mass, no nipple discharge, no skin change and no tenderness. Left breast exhibits no inverted nipple, no mass, no nipple discharge, no skin change and no tenderness.  Abdominal: Soft. Bowel sounds are normal. She exhibits no distension and no mass. There is no tenderness. There is no rebound and no guarding.  Musculoskeletal: She exhibits edema and tenderness.  Lymphadenopathy:    She has no cervical adenopathy.  Neurological: She is alert and oriented to person, place, and time. She displays tremor. She displays no seizure activity. Gait abnormal.  Fine tremor noted bilaterally in upper extremities -   Skin: Skin is warm and dry. Rash noted. She is not diaphoretic. There is erythema. No pallor.  Perioral dermatitis around nose/mouth and on chin No open tissue/drainage/streaking noted  Psychiatric: She has a normal mood and affect. Her behavior is normal. Judgment and thought content normal.  Nursing note and vitals reviewed.         Assessment & Plan:   1. Postmenopausal   2.  Other fatigue   3. Hypertension, unspecified type   4. Hyperlipidemia, unspecified hyperlipidemia type   5. Healthcare maintenance   6. Perioral dermatitis     Healthcare maintenance Continue all medications as directed. Continue to drink plenty of water and follow heart healthy diet. Recommend to eat every few hours to keep blood sugar levels stable. To treat "Perioral Dermatitis" around your nose/mouth, please use Erythromycin gel twice daily. We will call you when your lab results are available. Recommend regular follow-up in 6 months.  Hypertension BP at goal 105/64, HR Home BP readings: SBP 110-160s DBP 70-80s HR 70-80 Continue Amlodipine 10mg  QD, has been on for >10 years  Perioral dermatitis Erythromycin 2% gel Apply BID Keep skin clean/dry/OTA     FOLLOW-UP:  Return in about 6 months (around 12/22/2017) for Regular Follow Up.

## 2017-06-22 ENCOUNTER — Ambulatory Visit (INDEPENDENT_AMBULATORY_CARE_PROVIDER_SITE_OTHER): Payer: Federal, State, Local not specified - PPO | Admitting: Adult Health

## 2017-06-22 ENCOUNTER — Other Ambulatory Visit: Payer: Self-pay | Admitting: Adult Health

## 2017-06-22 ENCOUNTER — Encounter: Payer: Self-pay | Admitting: Adult Health

## 2017-06-22 VITALS — BP 105/65 | HR 73 | Ht 65.0 in | Wt 206.2 lb

## 2017-06-22 DIAGNOSIS — Z0000 Encounter for general adult medical examination without abnormal findings: Secondary | ICD-10-CM

## 2017-06-22 DIAGNOSIS — E785 Hyperlipidemia, unspecified: Secondary | ICD-10-CM

## 2017-06-22 DIAGNOSIS — R5383 Other fatigue: Secondary | ICD-10-CM

## 2017-06-22 DIAGNOSIS — Z1239 Encounter for other screening for malignant neoplasm of breast: Secondary | ICD-10-CM

## 2017-06-22 DIAGNOSIS — I10 Essential (primary) hypertension: Secondary | ICD-10-CM

## 2017-06-22 DIAGNOSIS — L710 Perioral dermatitis: Secondary | ICD-10-CM | POA: Diagnosis not present

## 2017-06-22 DIAGNOSIS — Z780 Asymptomatic menopausal state: Secondary | ICD-10-CM | POA: Diagnosis not present

## 2017-06-22 MED ORDER — ERYTHROMYCIN 2 % EX GEL: Freq: Every day | CUTANEOUS | 0 refills | Status: DC

## 2017-06-22 NOTE — Progress Notes (Signed)
Encounter opened in error.  T. Kanna Dafoe, CMA 

## 2017-06-22 NOTE — Patient Instructions (Addendum)
Preventive Care for Adults, Female  A healthy lifestyle and preventive care can promote health and wellness. Preventive health guidelines for women include the following key practices.   A routine yearly physical is a good way to check with your health care provider about your health and preventive screening. It is a chance to share any concerns and updates on your health and to receive a thorough exam.   Visit your dentist for a routine exam and preventive care every 6 months. Brush your teeth twice a day and floss once a day. Good oral hygiene prevents tooth decay and gum disease.   The frequency of eye exams is based on your age, health, family medical history, use of contact lenses, and other factors. Follow your health care provider's recommendations for frequency of eye exams.   Eat a healthy diet. Foods like vegetables, fruits, whole grains, low-fat dairy products, and lean protein foods contain the nutrients you need without too many calories. Decrease your intake of foods high in solid fats, added sugars, and salt. Eat the right amount of calories for you.Get information about a proper diet from your health care provider, if necessary.   Regular physical exercise is one of the most important things you can do for your health. Most adults should get at least 150 minutes of moderate-intensity exercise (any activity that increases your heart rate and causes you to sweat) each week. In addition, most adults need muscle-strengthening exercises on 2 or more days a week.   Maintain a healthy weight. The body mass index (BMI) is a screening tool to identify possible weight problems. It provides an estimate of body fat based on height and weight. Your health care provider can find your BMI, and can help you achieve or maintain a healthy weight.For adults 20 years and older:   - A BMI below 18.5 is considered underweight.   - A BMI of 18.5 to 24.9 is normal.   - A BMI of 25 to 29.9 is  considered overweight.   - A BMI of 30 and above is considered obese.   Maintain normal blood lipids and cholesterol levels by exercising and minimizing your intake of trans and saturated fats.  Eat a balanced diet with plenty of fruit and vegetables. Blood tests for lipids and cholesterol should begin at age 20 and be repeated every 5 years minimum.  If your lipid or cholesterol levels are high, you are over 40, or you are at high risk for heart disease, you may need your cholesterol levels checked more frequently.Ongoing high lipid and cholesterol levels should be treated with medicines if diet and exercise are not working.   If you smoke, find out from your health care provider how to quit. If you do not use tobacco, do not start.   Lung cancer screening is recommended for adults aged 55-80 years who are at high risk for developing lung cancer because of a history of smoking. A yearly low-dose CT scan of the lungs is recommended for people who have at least a 30-pack-year history of smoking and are a current smoker or have quit within the past 15 years. A pack year of smoking is smoking an average of 1 pack of cigarettes a day for 1 year (for example: 1 pack a day for 30 years or 2 packs a day for 15 years). Yearly screening should continue until the smoker has stopped smoking for at least 15 years. Yearly screening should be stopped for people who develop a   health problem that would prevent them from having lung cancer treatment.   If you are pregnant, do not drink alcohol. If you are breastfeeding, be very cautious about drinking alcohol. If you are not pregnant and choose to drink alcohol, do not have more than 1 drink per day. One drink is considered to be 12 ounces (355 mL) of beer, 5 ounces (148 mL) of wine, or 1.5 ounces (44 mL) of liquor.   Avoid use of street drugs. Do not share needles with anyone. Ask for help if you need support or instructions about stopping the use of  drugs.   High blood pressure causes heart disease and increases the risk of stroke. Your blood pressure should be checked at least yearly.  Ongoing high blood pressure should be treated with medicines if weight loss and exercise do not work.   If you are 69-55 years old, ask your health care provider if you should take aspirin to prevent strokes.   Diabetes screening involves taking a blood sample to check your fasting blood sugar level. This should be done once every 3 years, after age 38, if you are within normal weight and without risk factors for diabetes. Testing should be considered at a younger age or be carried out more frequently if you are overweight and have at least 1 risk factor for diabetes.   Breast cancer screening is essential preventive care for women. You should practice "breast self-awareness."  This means understanding the normal appearance and feel of your breasts and may include breast self-examination.  Any changes detected, no matter how small, should be reported to a health care provider.  Women in their 80s and 30s should have a clinical breast exam (CBE) by a health care provider as part of a regular health exam every 1 to 3 years.  After age 66, women should have a CBE every year.  Starting at age 1, women should consider having a mammogram (breast X-ray test) every year.  Women who have a family history of breast cancer should talk to their health care provider about genetic screening.  Women at a high risk of breast cancer should talk to their health care providers about having an MRI and a mammogram every year.   -Breast cancer gene (BRCA)-related cancer risk assessment is recommended for women who have family members with BRCA-related cancers. BRCA-related cancers include breast, ovarian, tubal, and peritoneal cancers. Having family members with these cancers may be associated with an increased risk for harmful changes (mutations) in the breast cancer genes BRCA1 and  BRCA2. Results of the assessment will determine the need for genetic counseling and BRCA1 and BRCA2 testing.   The Pap test is a screening test for cervical cancer. A Pap test can show cell changes on the cervix that might become cervical cancer if left untreated. A Pap test is a procedure in which cells are obtained and examined from the lower end of the uterus (cervix).   - Women should have a Pap test starting at age 57.   - Between ages 90 and 70, Pap tests should be repeated every 2 years.   - Beginning at age 63, you should have a Pap test every 3 years as long as the past 3 Pap tests have been normal.   - Some women have medical problems that increase the chance of getting cervical cancer. Talk to your health care provider about these problems. It is especially important to talk to your health care provider if a  new problem develops soon after your last Pap test. In these cases, your health care provider may recommend more frequent screening and Pap tests.   - The above recommendations are the same for women who have or have not gotten the vaccine for human papillomavirus (HPV).   - If you had a hysterectomy for a problem that was not cancer or a condition that could lead to cancer, then you no longer need Pap tests. Even if you no longer need a Pap test, a regular exam is a good idea to make sure no other problems are starting.   - If you are between ages 36 and 66 years, and you have had normal Pap tests going back 10 years, you no longer need Pap tests. Even if you no longer need a Pap test, a regular exam is a good idea to make sure no other problems are starting.   - If you have had past treatment for cervical cancer or a condition that could lead to cancer, you need Pap tests and screening for cancer for at least 20 years after your treatment.   - If Pap tests have been discontinued, risk factors (such as a new sexual partner) need to be reassessed to determine if screening should  be resumed.   - The HPV test is an additional test that may be used for cervical cancer screening. The HPV test looks for the virus that can cause the cell changes on the cervix. The cells collected during the Pap test can be tested for HPV. The HPV test could be used to screen women aged 70 years and older, and should be used in women of any age who have unclear Pap test results. After the age of 67, women should have HPV testing at the same frequency as a Pap test.   Colorectal cancer can be detected and often prevented. Most routine colorectal cancer screening begins at the age of 57 years and continues through age 26 years. However, your health care provider may recommend screening at an earlier age if you have risk factors for colon cancer. On a yearly basis, your health care provider may provide home test kits to check for hidden blood in the stool.  Use of a small camera at the end of a tube, to directly examine the colon (sigmoidoscopy or colonoscopy), can detect the earliest forms of colorectal cancer. Talk to your health care provider about this at age 23, when routine screening begins. Direct exam of the colon should be repeated every 5 -10 years through age 49 years, unless early forms of pre-cancerous polyps or small growths are found.   People who are at an increased risk for hepatitis B should be screened for this virus. You are considered at high risk for hepatitis B if:  -You were born in a country where hepatitis B occurs often. Talk with your health care provider about which countries are considered high risk.  - Your parents were born in a high-risk country and you have not received a shot to protect against hepatitis B (hepatitis B vaccine).  - You have HIV or AIDS.  - You use needles to inject street drugs.  - You live with, or have sex with, someone who has Hepatitis B.  - You get hemodialysis treatment.  - You take certain medicines for conditions like cancer, organ  transplantation, and autoimmune conditions.   Hepatitis C blood testing is recommended for all people born from [birth year] through 1965 and any individual  with known risks for hepatitis C.   Practice safe sex. Use condoms and avoid high-risk sexual practices to reduce the spread of sexually transmitted infections (STIs). STIs include gonorrhea, chlamydia, syphilis, trichomonas, herpes, HPV, and human immunodeficiency virus (HIV). Herpes, HIV, and HPV are viral illnesses that have no cure. They can result in disability, cancer, and death. Sexually active women aged 25 years and younger should be checked for chlamydia. Older women with new or multiple partners should also be tested for chlamydia. Testing for other STIs is recommended if you are sexually active and at increased risk.   Osteoporosis is a disease in which the bones lose minerals and strength with aging. This can result in serious bone fractures or breaks. The risk of osteoporosis can be identified using a bone density scan. Women ages 65 years and over and women at risk for fractures or osteoporosis should discuss screening with their health care providers. Ask your health care provider whether you should take a calcium supplement or vitamin D to There are also several preventive steps women can take to avoid osteoporosis and resulting fractures or to keep osteoporosis from worsening. -->Recommendations include:  Eat a balanced diet high in fruits, vegetables, calcium, and vitamins.  Get enough calcium. The recommended total intake of is 1,200 mg daily; for best absorption, if taking supplements, divide doses into 250-500 mg doses throughout the day. Of the two types of calcium, calcium carbonate is best absorbed when taken with food but calcium citrate can be taken on an empty stomach.  Get enough vitamin D. NAMS and the National Osteoporosis Foundation recommend at least 1,000 IU per day for women age 50 and over who are at risk of vitamin D  deficiency. Vitamin D deficiency can be caused by inadequate sun exposure (for example, those who live in northern latitudes).  Avoid alcohol and smoking. Heavy alcohol intake (more than 7 drinks per week) increases the risk of falls and hip fracture and women smokers tend to lose bone more rapidly and have lower bone mass than nonsmokers. Stopping smoking is one of the most important changes women can make to improve their health and decrease risk for disease.  Be physically active every day. Weight-bearing exercise (for example, fast walking, hiking, jogging, and weight training) may strengthen bones or slow the rate of bone loss that comes with aging. Balancing and muscle-strengthening exercises can reduce the risk of falling and fracture.  Consider therapeutic medications. Currently, several types of effective drugs are available. Healthcare providers can recommend the type most appropriate for each woman.  Eliminate environmental factors that may contribute to accidents. Falls cause nearly 90% of all osteoporotic fractures, so reducing this risk is an important bone-health strategy. Measures include ample lighting, removing obstructions to walking, using nonskid rugs on floors, and placing mats and/or grab bars in showers.  Be aware of medication side effects. Some common medicines make bones weaker. These include a type of steroid drug called glucocorticoids used for arthritis and asthma, some antiseizure drugs, certain sleeping pills, treatments for endometriosis, and some cancer drugs. An overactive thyroid gland or using too much thyroid hormone for an underactive thyroid can also be a problem. If you are taking these medicines, talk to your doctor about what you can do to help protect your bones.reduce the rate of osteoporosis.    Menopause can be associated with physical symptoms and risks. Hormone replacement therapy is available to decrease symptoms and risks. You should talk to your  health care provider   about whether hormone replacement therapy is right for you.   Use sunscreen. Apply sunscreen liberally and repeatedly throughout the day. You should seek shade when your shadow is shorter than you. Protect yourself by wearing long sleeves, pants, a wide-brimmed hat, and sunglasses year round, whenever you are outdoors.   Once a month, do a whole body skin exam, using a mirror to look at the skin on your back. Tell your health care provider of new moles, moles that have irregular borders, moles that are larger than a pencil eraser, or moles that have changed in shape or color.   -Stay current with required vaccines (immunizations).   Influenza vaccine. All adults should be immunized every year.  Tetanus, diphtheria, and acellular pertussis (Td, Tdap) vaccine. Pregnant women should receive 1 dose of Tdap vaccine during each pregnancy. The dose should be obtained regardless of the length of time since the last dose. Immunization is preferred during the 27th 36th week of gestation. An adult who has not previously received Tdap or who does not know her vaccine status should receive 1 dose of Tdap. This initial dose should be followed by tetanus and diphtheria toxoids (Td) booster doses every 10 years. Adults with an unknown or incomplete history of completing a 3-dose immunization series with Td-containing vaccines should begin or complete a primary immunization series including a Tdap dose. Adults should receive a Td booster every 10 years.  Varicella vaccine. An adult without evidence of immunity to varicella should receive 2 doses or a second dose if she has previously received 1 dose. Pregnant females who do not have evidence of immunity should receive the first dose after pregnancy. This first dose should be obtained before leaving the health care facility. The second dose should be obtained 4 8 weeks after the first dose.  Human papillomavirus (HPV) vaccine. Females aged 13 26  years who have not received the vaccine previously should obtain the 3-dose series. The vaccine is not recommended for use in pregnant females. However, pregnancy testing is not needed before receiving a dose. If a female is found to be pregnant after receiving a dose, no treatment is needed. In that case, the remaining doses should be delayed until after the pregnancy. Immunization is recommended for any person with an immunocompromised condition through the age of 26 years if she did not get any or all doses earlier. During the 3-dose series, the second dose should be obtained 4 8 weeks after the first dose. The third dose should be obtained 24 weeks after the first dose and 16 weeks after the second dose.  Zoster vaccine. One dose is recommended for adults aged 60 years or older unless certain conditions are present.  Measles, mumps, and rubella (MMR) vaccine. Adults born before 1957 generally are considered immune to measles and mumps. Adults born in 1957 or later should have 1 or more doses of MMR vaccine unless there is a contraindication to the vaccine or there is laboratory evidence of immunity to each of the three diseases. A routine second dose of MMR vaccine should be obtained at least 28 days after the first dose for students attending postsecondary schools, health care workers, or international travelers. People who received inactivated measles vaccine or an unknown type of measles vaccine during 1963 1967 should receive 2 doses of MMR vaccine. People who received inactivated mumps vaccine or an unknown type of mumps vaccine before 1979 and are at high risk for mumps infection should consider immunization with 2 doses of   MMR vaccine. For females of childbearing age, rubella immunity should be determined. If there is no evidence of immunity, females who are not pregnant should be vaccinated. If there is no evidence of immunity, females who are pregnant should delay immunization until after pregnancy.  Unvaccinated health care workers born before [birth year] who lack laboratory evidence of measles, mumps, or rubella immunity or laboratory confirmation of disease should consider measles and mumps immunization with 2 doses of MMR vaccine or rubella immunization with 1 dose of MMR vaccine.  Pneumococcal 13-valent conjugate (PCV13) vaccine. When indicated, a person who is uncertain of her immunization history and has no record of immunization should receive the PCV13 vaccine. An adult aged 54 years or older who has certain medical conditions and has not been previously immunized should receive 1 dose of PCV13 vaccine. This PCV13 should be followed with a dose of pneumococcal polysaccharide (PPSV23) vaccine. The PPSV23 vaccine dose should be obtained at least 8 weeks after the dose of PCV13 vaccine. An adult aged 58 years or older who has certain medical conditions and previously received 1 or more doses of PPSV23 vaccine should receive 1 dose of PCV13. The PCV13 vaccine dose should be obtained 1 or more years after the last PPSV23 vaccine dose.  Pneumococcal polysaccharide (PPSV23) vaccine. When PCV13 is also indicated, PCV13 should be obtained first. All adults aged 58 years and older should be immunized. An adult younger than age 65 years who has certain medical conditions should be immunized. Any person who resides in a nursing home or long-term care facility should be immunized. An adult smoker should be immunized. People with an immunocompromised condition and certain other conditions should receive both PCV13 and PPSV23 vaccines. People with human immunodeficiency virus (HIV) infection should be immunized as soon as possible after diagnosis. Immunization during chemotherapy or radiation therapy should be avoided. Routine use of PPSV23 vaccine is not recommended for American Indians, [place] Natives, or people younger than 65 years unless there are medical conditions that require PPSV23 vaccine. When indicated,  people who have unknown immunization and have no record of immunization should receive PPSV23 vaccine. One-time revaccination 5 years after the first dose of PPSV23 is recommended for people aged 70 64 years who have chronic kidney failure, nephrotic syndrome, asplenia, or immunocompromised conditions. People who received 1 2 doses of PPSV23 before age 32 years should receive another dose of PPSV23 vaccine at age 96 years or later if at least 5 years have passed since the previous dose. Doses of PPSV23 are not needed for people immunized with PPSV23 at or after age 55 years.  Meningococcal vaccine. Adults with asplenia or persistent complement component deficiencies should receive 2 doses of quadrivalent meningococcal conjugate (MenACWY-D) vaccine. The doses should be obtained at least 2 months apart. Microbiologists working with certain meningococcal bacteria, [employer] recruits, people at risk during an outbreak, and people who travel to or live in countries with a high rate of meningitis should be immunized. A first-year college student up through age 58 years who is living in a residence hall should receive a dose if she did not receive a dose on or after her 16th birthday. Adults who have certain high-risk conditions should receive one or more doses of vaccine.  Hepatitis A vaccine. Adults who wish to be protected from this disease, have certain high-risk conditions, work with hepatitis A-infected animals, work in hepatitis A research labs, or travel to or work in countries with a high rate of hepatitis A should be  immunized. Adults who were previously unvaccinated and who anticipate close contact with an international adoptee during the first 60 days after arrival in the [place] from a country with a high rate of hepatitis A should be immunized.  Hepatitis B vaccine.  Adults who wish to be protected from this disease, have certain high-risk conditions, may be exposed to blood or other infectious  body fluids, are household contacts or sex partners of hepatitis B positive people, are clients or workers in certain care facilities, or travel to or work in countries with a high rate of hepatitis B should be immunized.  Haemophilus influenzae type b (Hib) vaccine. A previously unvaccinated person with asplenia or sickle cell disease or having a scheduled splenectomy should receive 1 dose of Hib vaccine. Regardless of previous immunization, a recipient of a hematopoietic stem cell transplant should receive a 3-dose series 6 12 months after her successful transplant. Hib vaccine is not recommended for adults with HIV infection.  Preventive Services / Frequency Ages 6 to 39years  Blood pressure check.** / Every 1 to 2 years.  Lipid and cholesterol check.** / Every 5 years beginning at age 39.  Clinical breast exam.** / Every 3 years for women in their 61s and 62s.  BRCA-related cancer risk assessment.** / For women who have family members with a BRCA-related cancer (breast, ovarian, tubal, or peritoneal cancers).  Pap test.** / Every 2 years from ages 47 through 85. Every 3 years starting at age 34 through age 12 or 74 with a history of 3 consecutive normal Pap tests.  HPV screening.** / Every 3 years from ages 46 through ages 43 to 54 with a history of 3 consecutive normal Pap tests.  Hepatitis C blood test.** / For any individual with known risks for hepatitis C.  Skin self-exam. / Monthly.  Influenza vaccine. / Every year.  Tetanus, diphtheria, and acellular pertussis (Tdap, Td) vaccine.** / Consult your health care provider. Pregnant women should receive 1 dose of Tdap vaccine during each pregnancy. 1 dose of Td every 10 years.  Varicella vaccine.** / Consult your health care provider. Pregnant females who do not have evidence of immunity should receive the first dose after pregnancy.  HPV vaccine. / 3 doses over 6 months, if 64 and younger. The vaccine is not recommended for use in  pregnant females. However, pregnancy testing is not needed before receiving a dose.  Measles, mumps, rubella (MMR) vaccine.** / You need at least 1 dose of MMR if you were born in 1957 or later. You may also need a 2nd dose. For females of childbearing age, rubella immunity should be determined. If there is no evidence of immunity, females who are not pregnant should be vaccinated. If there is no evidence of immunity, females who are pregnant should delay immunization until after pregnancy.  Pneumococcal 13-valent conjugate (PCV13) vaccine.** / Consult your health care provider.  Pneumococcal polysaccharide (PPSV23) vaccine.** / 1 to 2 doses if you smoke cigarettes or if you have certain conditions.  Meningococcal vaccine.** / 1 dose if you are age 71 to 37 years and a Market researcher living in a residence hall, or have one of several medical conditions, you need to get vaccinated against meningococcal disease. You may also need additional booster doses.  Hepatitis A vaccine.** / Consult your health care provider.  Hepatitis B vaccine.** / Consult your health care provider.  Haemophilus influenzae type b (Hib) vaccine.** / Consult your health care provider.  Ages 55 to 64years  Blood pressure check.** / Every 1 to 2 years.  Lipid and cholesterol check.** / Every 5 years beginning at age 20 years.  Lung cancer screening. / Every year if you are aged 55 80 years and have a 30-pack-year history of smoking and currently smoke or have quit within the past 15 years. Yearly screening is stopped once you have quit smoking for at least 15 years or develop a health problem that would prevent you from having lung cancer treatment.  Clinical breast exam.** / Every year after age 40 years.  BRCA-related cancer risk assessment.** / For women who have family members with a BRCA-related cancer (breast, ovarian, tubal, or peritoneal cancers).  Mammogram.** / Every year beginning at age 40  years and continuing for as long as you are in good health. Consult with your health care provider.  Pap test.** / Every 3 years starting at age 30 years through age 65 or 70 years with a history of 3 consecutive normal Pap tests.  HPV screening.** / Every 3 years from ages 30 years through ages 65 to 70 years with a history of 3 consecutive normal Pap tests.  Fecal occult blood test (FOBT) of stool. / Every year beginning at age 50 years and continuing until age 75 years. You may not need to do this test if you get a colonoscopy every 10 years.  Flexible sigmoidoscopy or colonoscopy.** / Every 5 years for a flexible sigmoidoscopy or every 10 years for a colonoscopy beginning at age 50 years and continuing until age 75 years.  Hepatitis C blood test.** / For all people born from 1945 through 1965 and any individual with known risks for hepatitis C.  Skin self-exam. / Monthly.  Influenza vaccine. / Every year.  Tetanus, diphtheria, and acellular pertussis (Tdap/Td) vaccine.** / Consult your health care provider. Pregnant women should receive 1 dose of Tdap vaccine during each pregnancy. 1 dose of Td every 10 years.  Varicella vaccine.** / Consult your health care provider. Pregnant females who do not have evidence of immunity should receive the first dose after pregnancy.  Zoster vaccine.** / 1 dose for adults aged 60 years or older.  Measles, mumps, rubella (MMR) vaccine.** / You need at least 1 dose of MMR if you were born in 1957 or later. You may also need a 2nd dose. For females of childbearing age, rubella immunity should be determined. If there is no evidence of immunity, females who are not pregnant should be vaccinated. If there is no evidence of immunity, females who are pregnant should delay immunization until after pregnancy.  Pneumococcal 13-valent conjugate (PCV13) vaccine.** / Consult your health care provider.  Pneumococcal polysaccharide (PPSV23) vaccine.** / 1 to 2 doses if  you smoke cigarettes or if you have certain conditions.  Meningococcal vaccine.** / Consult your health care provider.  Hepatitis A vaccine.** / Consult your health care provider.  Hepatitis B vaccine.** / Consult your health care provider.  Haemophilus influenzae type b (Hib) vaccine.** / Consult your health care provider.  Ages 65 years and over  Blood pressure check.** / Every 1 to 2 years.  Lipid and cholesterol check.** / Every 5 years beginning at age 20 years.  Lung cancer screening. / Every year if you are aged 55 80 years and have a 30-pack-year history of smoking and currently smoke or have quit within the past 15 years. Yearly screening is stopped once you have quit smoking for at least 15 years or develop a health problem that   would prevent you from having lung cancer treatment.  Clinical breast exam.** / Every year after age 103 years.  BRCA-related cancer risk assessment.** / For women who have family members with a BRCA-related cancer (breast, ovarian, tubal, or peritoneal cancers).  Mammogram.** / Every year beginning at age 36 years and continuing for as long as you are in good health. Consult with your health care provider.  Pap test.** / Every 3 years starting at age 5 years through age 85 or 10 years with 3 consecutive normal Pap tests. Testing can be stopped between 65 and 70 years with 3 consecutive normal Pap tests and no abnormal Pap or HPV tests in the past 10 years.  HPV screening.** / Every 3 years from ages 93 years through ages 70 or 45 years with a history of 3 consecutive normal Pap tests. Testing can be stopped between 65 and 70 years with 3 consecutive normal Pap tests and no abnormal Pap or HPV tests in the past 10 years.  Fecal occult blood test (FOBT) of stool. / Every year beginning at age 8 years and continuing until age 45 years. You may not need to do this test if you get a colonoscopy every 10 years.  Flexible sigmoidoscopy or colonoscopy.** /  Every 5 years for a flexible sigmoidoscopy or every 10 years for a colonoscopy beginning at age 69 years and continuing until age 68 years.  Hepatitis C blood test.** / For all people born from [birth year] through 1965 and any individual with known risks for hepatitis C.  Osteoporosis screening.** / A one-time screening for women ages 7 years and over and women at risk for fractures or osteoporosis.  Skin self-exam. / Monthly.  Influenza vaccine. / Every year.  Tetanus, diphtheria, and acellular pertussis (Tdap/Td) vaccine.** / 1 dose of Td every 10 years.  Varicella vaccine.** / Consult your health care provider.  Zoster vaccine.** / 1 dose for adults aged 5 years or older.  Pneumococcal 13-valent conjugate (PCV13) vaccine.** / Consult your health care provider.  Pneumococcal polysaccharide (PPSV23) vaccine.** / 1 dose for all adults aged 74 years and older.  Meningococcal vaccine.** / Consult your health care provider.  Hepatitis A vaccine.** / Consult your health care provider.  Hepatitis B vaccine.** / Consult your health care provider.  Haemophilus influenzae type b (Hib) vaccine.** / Consult your health care provider. ** Family history and personal history of risk and conditions may change your health care provider's recommendations. Document Released: 05/04/2001 Document Revised: 12/27/2012  [facility] Patient Information 2014 [place], [place].   EXERCISE AND DIET:  We recommended that you start or continue a regular exercise program for good health. Regular exercise means any activity that makes your heart beat faster and makes you sweat.  We recommend exercising at least 30 minutes per day at least 3 days a week, preferably 5.  We also recommend a diet low in fat and sugar / carbohydrates.  Inactivity, poor dietary choices and obesity can cause diabetes, heart attack, stroke, and kidney damage, among others.     ALCOHOL AND SMOKING:  Women should limit their alcohol intake to no  more than 7 drinks/beers/glasses of wine (combined, not each!) per week. Moderation of alcohol intake to this level decreases your risk of breast cancer and liver damage.  ( And of course, no recreational drugs are part of a healthy lifestyle.)  Also, you should not be smoking at all or even being exposed to second hand smoke. Most people know smoking can  cause cancer, and various heart and lung diseases, but did you know it also contributes to weakening of your bones?  Aging of your skin?  Yellowing of your teeth and nails?   CALCIUM AND VITAMIN D:  Adequate intake of calcium and Vitamin D are recommended.  The recommendations for exact amounts of these supplements seem to change often, but generally speaking 600 mg of calcium (either carbonate or citrate) and 800 units of Vitamin D per day seems prudent. Certain women may benefit from higher intake of Vitamin D.  If you are among these women, your doctor will have told you during your visit.     PAP SMEARS:  Pap smears, to check for cervical cancer or precancers,  have traditionally been done yearly, although recent scientific advances have shown that most women can have pap smears less often.  However, every woman still should have a physical exam from her gynecologist or primary care physician every year. It will include a breast check, inspection of the vulva and vagina to check for abnormal growths or skin changes, a visual exam of the cervix, and then an exam to evaluate the size and shape of the uterus and ovaries.  And after 72 years of age, a rectal exam is indicated to check for rectal cancers. We will also provide age appropriate advice regarding health maintenance, like when you should have certain vaccines, screening for sexually transmitted diseases, bone density testing, colonoscopy, mammograms, etc.    MAMMOGRAMS:  All women over 74 years old should have a yearly mammogram. Many facilities now offer a "3D" mammogram, which may cost  around $50 extra out of pocket. If possible,  we recommend you accept the option to have the 3D mammogram performed.  It both reduces the number of women who will be called back for extra views which then turn out to be normal, and it is better than the routine mammogram at detecting truly abnormal areas.     COLONOSCOPY:  Colonoscopy to screen for colon cancer is recommended for all women at age 67.  We know, you hate the idea of the prep.  We agree, BUT, having colon cancer and not knowing it is worse!!  Colon cancer so often starts as a polyp that can be seen and removed at colonscopy, which can quite literally save your life!  And if your first colonoscopy is normal and you have no family history of colon cancer, most women don't have to have it again for 10 years.  Once every ten years, you can do something that may end up saving your life, right?  We will be happy to help you get it scheduled when you are ready.  Be sure to check your insurance coverage so you understand how much it will cost.  It may be covered as a preventative service at no cost, but you should check your particular policy.    Continue all medications as directed. Continue to drink plenty of water and follow heart healthy diet. Recommend to eat every few hours to keep blood sugar levels stable. To treat "Perioral Dermatitis" around your nose/mouth, please use Erythromycin gel twice daily. We will call you when your lab results are available. Recommend regular follow-up in 6 months. NICE TO SEE YOU!

## 2017-06-22 NOTE — Assessment & Plan Note (Signed)
BP at goal 105/64, HR Home BP readings: SBP 110-160s DBP 70-80s HR 70-80 Continue Amlodipine 10mg  QD, has been on for >10 years

## 2017-06-22 NOTE — Assessment & Plan Note (Addendum)
Erythromycin 2% gel Apply BID Keep skin clean/dry/OTA

## 2017-06-22 NOTE — Assessment & Plan Note (Signed)
Continue all medications as directed. Continue to drink plenty of water and follow heart healthy diet. Recommend to eat every few hours to keep blood sugar levels stable. To treat "Perioral Dermatitis" around your nose/mouth, please use Erythromycin gel twice daily. We will call you when your lab results are available. Recommend regular follow-up in 6 months.

## 2017-06-23 ENCOUNTER — Other Ambulatory Visit: Payer: Self-pay | Admitting: Adult Health

## 2017-06-23 DIAGNOSIS — I10 Essential (primary) hypertension: Secondary | ICD-10-CM

## 2017-06-23 DIAGNOSIS — R944 Abnormal results of kidney function studies: Secondary | ICD-10-CM

## 2017-06-23 LAB — CBC WITH DIFFERENTIAL/PLATELET
Basophils Absolute: 0 10*3/uL (ref 0.0–0.2)
Basos: 0 %
EOS (ABSOLUTE): 0.1 10*3/uL (ref 0.0–0.4)
Eos: 2 %
HEMOGLOBIN: 12.4 g/dL (ref 11.1–15.9)
Hematocrit: 37.6 % (ref 34.0–46.6)
Immature Grans (Abs): 0 10*3/uL (ref 0.0–0.1)
Immature Granulocytes: 0 %
LYMPHS ABS: 2.3 10*3/uL (ref 0.7–3.1)
Lymphs: 46 %
MCH: 29 pg (ref 26.6–33.0)
MCHC: 33 g/dL (ref 31.5–35.7)
MCV: 88 fL (ref 79–97)
MONOCYTES: 7 %
Monocytes Absolute: 0.3 10*3/uL (ref 0.1–0.9)
Neutrophils Absolute: 2.2 10*3/uL (ref 1.4–7.0)
Neutrophils: 45 %
Platelets: 251 10*3/uL (ref 150–379)
RBC: 4.28 x10E6/uL (ref 3.77–5.28)
RDW: 14.7 % (ref 12.3–15.4)
WBC: 5 10*3/uL (ref 3.4–10.8)

## 2017-06-23 LAB — TSH: TSH: 3.53 u[IU]/mL (ref 0.450–4.500)

## 2017-06-23 LAB — COMPREHENSIVE METABOLIC PANEL
ALBUMIN: 4.5 g/dL (ref 3.5–4.8)
ALK PHOS: 96 IU/L (ref 39–117)
ALT: 17 IU/L (ref 0–32)
AST: 23 IU/L (ref 0–40)
Albumin/Globulin Ratio: 1.4 (ref 1.2–2.2)
BUN/Creatinine Ratio: 14 (ref 12–28)
BUN: 19 mg/dL (ref 8–27)
Bilirubin Total: 0.3 mg/dL (ref 0.0–1.2)
CHLORIDE: 100 mmol/L (ref 96–106)
CO2: 22 mmol/L (ref 20–29)
CREATININE: 1.32 mg/dL — AB (ref 0.57–1.00)
Calcium: 9.6 mg/dL (ref 8.7–10.3)
GFR calc Af Amer: 47 mL/min/{1.73_m2} — ABNORMAL LOW (ref >59)
GFR calc non Af Amer: 41 mL/min/{1.73_m2} — ABNORMAL LOW (ref >59)
Globulin, Total: 3.2 g/dL (ref 1.5–4.5)
Glucose: 123 mg/dL — ABNORMAL HIGH (ref 65–99)
Potassium: 4.3 mmol/L (ref 3.5–5.2)
Sodium: 140 mmol/L (ref 134–144)
Total Protein: 7.7 g/dL (ref 6.0–8.5)

## 2017-06-23 LAB — HEMOGLOBIN A1C
ESTIMATED AVERAGE GLUCOSE: 123 mg/dL
HEMOGLOBIN A1C: 5.9 % — AB (ref 4.8–5.6)

## 2017-06-23 LAB — LIPID PANEL
CHOLESTEROL TOTAL: 155 mg/dL (ref 100–199)
Chol/HDL Ratio: 2.8 ratio (ref 0.0–4.4)
HDL: 55 mg/dL (ref >39)
LDL Calculated: 85 mg/dL (ref 0–99)
TRIGLYCERIDES: 77 mg/dL (ref 0–149)
VLDL Cholesterol Cal: 15 mg/dL (ref 5–40)

## 2017-06-24 ENCOUNTER — Encounter: Payer: Self-pay | Admitting: Adult Health

## 2017-06-30 ENCOUNTER — Encounter: Payer: Self-pay | Admitting: Adult Health

## 2017-07-04 ENCOUNTER — Ambulatory Visit: Payer: Medicare Other

## 2017-07-04 LAB — SJOGREN'S ANTI-SS-B: SJOGREN'S ANTI-SS-B: 1.4

## 2017-07-04 LAB — CYCLIC CITRUL PEPTIDE ANTIBODY, IGG/IGA: CCP Antibodies IgG/IgA: 4

## 2017-07-04 LAB — ANTINUCLEAR ANTIBODIES, IFA
ANA BY IFA SDFS: POSITIVE
ANA Direct: POSITIVE

## 2017-07-04 LAB — C4 COMPLEMENT: C4 Complement: 41

## 2017-07-04 LAB — LATEX, IGE: RA Latex Turbid.: 10.2

## 2017-07-04 LAB — SED RATE MANUAL WEST: SED RATE BY MODIFIED WESTERGREN,MANUAL: 26

## 2017-07-04 LAB — C3 COMPLEMENT: Complement C3, Serum: 143

## 2017-07-04 LAB — COMPLEMENT, TOTAL (CH50)

## 2017-07-04 LAB — C-REACTIVE PROTEIN: CRP: 1.2

## 2017-07-05 ENCOUNTER — Telehealth: Payer: Self-pay | Admitting: Adult Health

## 2017-07-05 DIAGNOSIS — L710 Perioral dermatitis: Secondary | ICD-10-CM

## 2017-07-05 NOTE — Telephone Encounter (Signed)
Patient wants to speak with someone clinical about an ongoing rash that she has been dealing with. She isn't sure if its a med issue or what, but wants to discuss about options to help

## 2017-07-06 NOTE — Telephone Encounter (Signed)
Pt states that the rash has now spread into her nasal cavity.  Per Katy Danford,William Hamburger referral to derm placed and pt is to use OTC antihistamine.  Pt informed.  Tiajuana Amass. Nelson, CMA

## 2017-07-06 NOTE — Telephone Encounter (Signed)
LVM for pt to call to discuss.  T. Nelson, CMA  

## 2017-07-06 NOTE — Addendum Note (Signed)
Addended by: Stan HeadNELSON, TONYA S on: 07/06/2017 11:49 AM   Modules accepted: Orders

## 2017-07-06 NOTE — Telephone Encounter (Signed)
LVM for pt to call to discuss.  T. Silas Muff, CMA  

## 2017-07-13 ENCOUNTER — Encounter: Payer: Self-pay | Admitting: Adult Health

## 2017-07-25 ENCOUNTER — Other Ambulatory Visit: Payer: Self-pay | Admitting: Adult Health

## 2017-07-25 DIAGNOSIS — E2839 Other primary ovarian failure: Secondary | ICD-10-CM

## 2017-07-27 ENCOUNTER — Inpatient Hospital Stay: Admission: RE | Admit: 2017-07-27 | Payer: Federal, State, Local not specified - PPO | Source: Ambulatory Visit

## 2017-07-27 ENCOUNTER — Ambulatory Visit
Admission: RE | Admit: 2017-07-27 | Discharge: 2017-07-27 | Disposition: A | Discharge status: Home / Self Care | Payer: Federal, State, Local not specified - PPO | Source: Ambulatory Visit | Attending: Adult Health | Admitting: Adult Health

## 2017-07-27 DIAGNOSIS — E2839 Other primary ovarian failure: Secondary | ICD-10-CM

## 2017-07-28 ENCOUNTER — Telehealth: Payer: Self-pay | Admitting: Gastroenterology

## 2017-07-28 ENCOUNTER — Other Ambulatory Visit: Payer: Self-pay | Admitting: Adult Health

## 2017-07-28 ENCOUNTER — Other Ambulatory Visit: Payer: Self-pay

## 2017-07-28 DIAGNOSIS — E2839 Other primary ovarian failure: Secondary | ICD-10-CM

## 2017-07-28 NOTE — Telephone Encounter (Signed)
Patient called to request Rx refills on:  hydroxychloroquine (PLAQUENIL) 200 MG tablet [[id number]]   Order Details  Dose: 200 mg Route: Oral Frequency: 2 times daily  Dispense Quantity: -- Refills: -- Fills remaining: --        Sig: Take 200 mg by mouth 2 (two) times daily.          --- Pt uses this pharmacy:  Preferred Pharmacies      [id number] - [place], [place] - [address] [phone] ([phone]    ---Forwarding message to medical assistant for processing----  Please call pt if there are any questions.   --glh

## 2017-07-28 NOTE — Telephone Encounter (Signed)
Received referral in Epic for patient to be seen for diverticulitis. Patient states they were last seen at [facility] less than a year ago, but now live in [place]. Office and procedure records in Epic under media and procedure. DOD for referral date 3.6.19 is Dr.Danis. Please review and advise.

## 2017-07-28 NOTE — Telephone Encounter (Signed)
We have not prescribed these medications for the patient previously.  Please review and refill if appropriate.  T. Nelson, CMA  

## 2017-07-29 NOTE — Telephone Encounter (Signed)
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  (please investigate why it took 2 months for this referral to come to MD review)  I will review prior records in more detail when patient comes to see me.  I would be happy to see her in office consult.  Please set her up with new patient appointment.  Since she has waited so long, please offer her an appointment with one of the APPs since they are likely to be available much sooner than MD.

## 2017-08-03 ENCOUNTER — Ambulatory Visit (INDEPENDENT_AMBULATORY_CARE_PROVIDER_SITE_OTHER): Payer: Federal, State, Local not specified - PPO

## 2017-08-03 DIAGNOSIS — I10 Essential (primary) hypertension: Secondary | ICD-10-CM

## 2017-08-03 DIAGNOSIS — R944 Abnormal results of kidney function studies: Secondary | ICD-10-CM

## 2017-08-04 LAB — COMPREHENSIVE METABOLIC PANEL
A/G RATIO: 1.4 (ref 1.2–2.2)
ALBUMIN: 4.1 g/dL (ref 3.5–4.8)
ALK PHOS: 84 IU/L (ref 39–117)
ALT: 21 IU/L (ref 0–32)
AST: 24 IU/L (ref 0–40)
BILIRUBIN TOTAL: 0.3 mg/dL (ref 0.0–1.2)
BUN / CREAT RATIO: 17 (ref 12–28)
BUN: 18 mg/dL (ref 8–27)
CO2: 22 mmol/L (ref 20–29)
CREATININE: 1.09 mg/dL — AB (ref 0.57–1.00)
Calcium: 9.5 mg/dL (ref 8.7–10.3)
Chloride: 104 mmol/L (ref 96–106)
GFR calc Af Amer: 59 mL/min/{1.73_m2} — ABNORMAL LOW (ref >59)
GFR calc non Af Amer: 51 mL/min/{1.73_m2} — ABNORMAL LOW (ref >59)
GLOBULIN, TOTAL: 3 g/dL (ref 1.5–4.5)
Glucose: 135 mg/dL — ABNORMAL HIGH (ref 65–99)
POTASSIUM: 4.4 mmol/L (ref 3.5–5.2)
SODIUM: 142 mmol/L (ref 134–144)
Total Protein: 7.1 g/dL (ref 6.0–8.5)

## 2017-08-04 NOTE — Progress Notes (Signed)
Labs only

## 2017-08-09 ENCOUNTER — Encounter: Payer: Self-pay | Admitting: Gastroenterology

## 2017-08-09 NOTE — Telephone Encounter (Signed)
Patient did not return call to decide which doc to review for two months. Patient states she has flare ups but is okay for the time being and wanted to wait to schedule with Dr.Danis. Pt scheduled for 7.22.19 and states she will cb to schedule with APP if symptoms worsen.

## 2017-10-10 ENCOUNTER — Encounter: Payer: Self-pay | Admitting: Gastroenterology

## 2017-10-10 ENCOUNTER — Ambulatory Visit: Payer: Federal, State, Local not specified - PPO | Admitting: Gastroenterology

## 2017-10-10 VITALS — BP 118/70 | HR 88 | Ht 65.0 in | Wt 213.0 lb

## 2017-10-10 DIAGNOSIS — K580 Irritable bowel syndrome with diarrhea: Secondary | ICD-10-CM

## 2017-10-10 DIAGNOSIS — R1032 Left lower quadrant pain: Secondary | ICD-10-CM | POA: Diagnosis not present

## 2017-10-10 NOTE — Patient Instructions (Addendum)
If you are age 72 or older, your body mass index should be between 23-30. Your Body mass index is 35.45 kg/m. If this is out of the aforementioned range listed, please consider follow up with your Primary Care Provider.  If you are age 72 or younger, your body mass index should be between 19-25. Your Body mass index is 35.45 kg/m. If this is out of the aformentioned range listed, please consider follow up with your Primary Care Provider.     Daily fiber supplement - one tablespoon of citrucel (or generic equivalent)  Food Guidelines for a sensitive stomach  Many people have difficulty digesting certain foods, causing a variety of distressing and embarrassing symptoms such as abdominal pain, bloating and gas.  These foods may need to be avoided or consumed in small amounts.  Here are some tips that might be helpful for you.  1.   Lactose intolerance is the difficulty or complete inability to digest lactose, the natural sugar in milk and anything made from milk.  This condition is harmless, common, and can begin any time during life.  Some people can digest a modest amount of lactose while others cannot tolerate any.  Also, not all dairy products contain equal amounts of lactose.  For example, hard cheeses such as parmesan have less lactose than soft cheeses such as cheddar.  Yogurt has less lactose than milk or cheese.  Many packaged foods (even many brands of bread) have milk, so read ingredient lists carefully.  It is difficult to test for lactose intolerance, so just try avoiding lactose as much as possible for a week and see what happens with your symptoms.  If you seem to be lactose intolerant, the best plan is to avoid it (but make sure you get calcium from another source).  The next best thing is to use lactase enzyme supplements, available over the counter everywhere.  Just know that many lactose intolerant people need to take several tablets with each serving of dairy to avoid symptoms.   Lastly, a lot of restaurant food is made with milk or butter.  Many are things you might not suspect, such as mashed potatoes, rice and pasta (cooked with butter) and "grilled" items.  If you are lactose intolerant, it never hurts to ask your server what has milk or butter.  2.   Fiber is an important part of your diet, but not all fiber is well-tolerated.  Insoluble fiber such as bran is often consumed by normal gut bacteria and converted into gas.  Soluble fiber such as oats, squash, carrots and green beans are typically tolerated better.  3.   Some types of carbohydrates can be poorly digested.  Examples include: fructose (apples, cherries, pears, raisins and other dried fruits), fructans (onions, zucchini, large amounts of wheat), sorbitol/mannitol/xylitol and sucralose/Splenda (common artificial sweeteners), and raffinose (lentils, broccoli, cabbage, asparagus, brussel sprouts, many types of beans).  Do a Programmer, multimediaweb search for National CityFODMAP diet and you will find helpful information. Beano, a dietary supplement, will often help with raffinose-containing foods.  As with lactase tablets, you may need several per serving.  4.   Whenever possible, avoid processed food&meats and chemical additives.  High fructose corn syrup, a common sweetener, may be difficult to digest.  Eggs and soy (comes from the soybean, and added to many foods now) are other common bloating/gassy foods.  - Dr. Sherlynn CarbonHenry Danis [facility]

## 2017-10-10 NOTE — Progress Notes (Signed)
[facility] Consult Note:  History: Dawn Cox 10/10/2017  Referring physician: Julaine Fusi, NP  Reason for consult/chief complaint: Diverticulitis (generalized pain x 1 week.  Pt has had multiple colonoscopies and Endosocopies most recent in [place])   Subjective  HPI:  This is a pleasant 72 year old woman establishing care for chronic GI problems, she moved here last year from [place].  Some records were available for review from the [facility] GI group as below: [facility] office note 11/2016, chronic diarrhea, IBS, NAFLD, RUQ pain, prior  nml HIDA.  She had apparently had a flare of this diarrhea after antibiotics, and stool infectious studies were negative.  Stool lactoferrin was positive. That office note reports the following: Colonoscopy 01/17/2012, stool in the whole colon, left-sided diverticulosis, hemorrhoids.  Unremarkable terminal ileum. Colonoscopy 01/04/2012, poor prep, procedure terminated Upper endoscopy 10/13/2015, hiatal hernia, gastric erythema. HIDA scan 11/04/2015, normal with gallbladder EF 76%  In office note from 01/08/2015 indicates reported colon polyps on a colonoscopy done in [place] that year.  She was also given a refill of Robinul which she had taken on and off for years for her abdominal pain and diarrhea. According to the notes, on at least one occasion she was seen in the emergency department visit for abdominal pain and told she had diverticulitis, there is no documentation of this.  She reports that generally, the Robinul once a day will control her abdominal pain and diarrhea.  Lately she has needed to twice a day but is not sure why her symptoms have flared.  She will episodically have more abdominal pain or diarrhea, and then things will subside.  She has had no recent travel, sick contacts, new medicines, antibiotics or other clear triggers for symptoms.  She has been on Robinul for years, her prior  office records only gave back to 2016.  She seems to recall perhaps she was given a previous medicine but does not know what it was.  At some point she was given a spray that she used in her mouth and seem to "soothe everything".  ROS:  Review of Systems  Constitutional: Positive for fatigue. Negative for appetite change and unexpected weight change.  HENT: Negative for mouth sores and voice change.   Eyes: Negative for pain and redness.  Respiratory: Positive for cough and shortness of breath.   Cardiovascular: Negative for chest pain and palpitations.  Genitourinary: Positive for urgency. Negative for dysuria and hematuria.  Musculoskeletal: Positive for arthralgias and back pain. Negative for myalgias.  Skin: Negative for pallor and rash.  Allergic/Immunologic: Positive for environmental allergies.  Neurological: Negative for weakness and headaches.  Hematological: Negative for adenopathy.  Psychiatric/Behavioral: The patient is nervous/anxious.      Past Medical History: Past Medical History:  Diagnosis Date  . Anxiety   . Depression   . Diverticulitis   . Hyperlipidemia   . Hypertension   . Lupus (HCC)   . Retinal detachment   . Retinal hemorrhage      Past Surgical History: Past Surgical History:  Procedure Laterality Date  . ABDOMINAL HYSTERECTOMY    . BREAST BIOPSY    . BREAST SURGERY Right    cyst removal  . CATARACT EXTRACTION Bilateral   . EYE SURGERY Right    hemorrhagia of retina  . HERNIA REPAIR    . RETINAL DETACHMENT SURGERY       Family History: Family History  Problem Relation Age of Onset  . Heart attack Mother   .  Liver cancer Father   . Depression Daughter        suicide  . Breast cancer Cousin   . Colon cancer Neg Hx   . Stomach cancer Neg Hx   . Esophageal cancer Neg Hx   . Rectal cancer Neg Hx     Social History: Social History   Socioeconomic History  . Marital status: Married    Spouse name: [name]  . Number of  children: 2  . Years of education: Not on file  . Highest education level: Not on file  Occupational History  . Occupation: retired  Engineer, production  . Financial resource strain: Not on file  . Food insecurity:    Worry: Not on file    Inability: Not on file  . Transportation needs:    Medical: Not on file    Non-medical: Not on file  Tobacco Use  . Smoking status: Never Smoker  . Smokeless tobacco: Never Used  Substance and Sexual Activity  . Alcohol use: Yes    Frequency: Never    Comment: rarely  . Drug use: No  . Sexual activity: Not Currently  Lifestyle  . Physical activity:    Days per week: Not on file    Minutes per session: Not on file  . Stress: Not on file  Relationships  . Social connections:    Talks on phone: Not on file    Gets together: Not on file    Attends religious service: Not on file    Active member of club or organization: Not on file    Attends meetings of clubs or organizations: Not on file    Relationship status: Not on file  Other Topics Concern  . Not on file  Social History Narrative  . Not on file    Allergies: Allergies  Allergen Reactions  . Aloe   . Latex   . Other     aryrthromycin- IBS Xray dye  . Shellfish Allergy   . Vibramycin [Doxycycline Calcium]     Outpatient Meds: Current Outpatient Medications  Medication Sig Dispense Refill  . albuterol (PROVENTIL HFA;VENTOLIN HFA) 108 (90 Base) MCG/ACT inhaler Inhale 1 puff into the lungs every 6 (six) hours as needed for wheezing or shortness of breath.    Marland Kitchen amLODipine (NORVASC) 10 MG tablet Take 10 mg by mouth daily.    Marland Kitchen aspirin 325 MG tablet Take 325 mg by mouth every 4 (four) hours as needed.    Marland Kitchen atorvastatin (LIPITOR) 20 MG tablet Take 20 mg by mouth daily.  1  . FLUoxetine HCl 60 MG TABS Take 1 tablet by mouth daily.    Marland Kitchen glycopyrrolate (ROBINUL) 2 MG tablet Take 2 mg by mouth daily as needed for pain.  3  . Turmeric 500 MG CAPS Take 1,000 mg by mouth daily.     No  current facility-administered medications for this visit.       ___________________________________________________________________ Objective   Exam:  BP 118/70   Pulse 88   Ht 5\' 5"  (1.651 m)   Wt 213 lb (96.6 kg)   BMI 35.45 kg/m    General: this is a(n) well-appearing woman, gets on exam table without assistance  Eyes: sclera anicteric, no redness  ENT: oral mucosa moist without lesions, no cervical or supraclavicular lymphadenopathy, good dentition  CV: RRR without murmur, S1/S2, no JVD, no peripheral edema  Resp: clear to auscultation bilaterally, normal RR and effort noted  GI: soft, no tenderness, with active bowel sounds.  No guarding or palpable organomegaly noted.  Skin; warm and dry, no rash or jaundice noted  Neuro: awake, alert and oriented x 3. Normal gross motor function and fluent speech  Labs:  CBC Latest Ref Rng & Units 06/22/2017 05/04/2017  WBC 3.4 - 10.8 x10E3/uL 5.0 4.6  Hemoglobin 11.1 - 15.9 g/dL 40.912.4 11.5(L)  Hematocrit 34.0 - 46.6 % 37.6 34.7(L)  Platelets 150 - 379 x10E3/uL 251 244   CMP Latest Ref Rng & Units 08/03/2017 06/22/2017 05/04/2017  Glucose 65 - 99 mg/dL 811(B135(H) 147(W123(H) 295(A104(H)  BUN 8 - 27 mg/dL 18 19 20   Creatinine 0.57 - 1.00 mg/dL 2.13(Y1.09(H) 8.65(H1.32(H) 8.46(N1.24(H)  Sodium 134 - 144 mmol/L 142 140 140  Potassium 3.5 - 5.2 mmol/L 4.4 4.3 4.8  Chloride 96 - 106 mmol/L 104 100 105  CO2 20 - 29 mmol/L 22 22 26   Calcium 8.7 - 10.3 mg/dL 9.5 9.6 9.0  Total Protein 6.0 - 8.5 g/dL 7.1 7.7 -  Total Bilirubin 0.0 - 1.2 mg/dL 0.3 0.3 -  Alkaline Phos 39 - 117 IU/L 84 96 -  AST 0 - 40 IU/L 24 23 -  ALT 0 - 32 IU/L 21 17 -     Assessment: Encounter Diagnoses  Name Primary?  . Irritable bowel syndrome with diarrhea Yes  . LLQ abdominal pain     Long-standing IBS D, reported previous diverticulitis uncertain, no details about that available.  She has episodic flares of symptoms and previous testing to secure her diagnosis.  Her current therapy  generally works well for her, and she would like to continue it.  Plan:  I have given her some dietary instructions for consideration and also advised a daily fiber supplement to help maintain better form of the stool.  When she needs a refill of Robinul, her pharmacy can contact me.  It is not clear when her next routine/screening colonoscopy should be done, since the quality of the preparation on the 2013 exam is somewhat uncertain.  It may need to be done sooner than a 10-year interval.  I will see her back in a year or sooner as needed.  Thank you for the courtesy of this consult.  Please call me with any questions or concerns.  Charlie PitterHenry L Danis III  CC: Julaine Fusianford, Katy D, NP

## 2017-10-16 ENCOUNTER — Encounter: Payer: Self-pay | Admitting: Adult Health

## 2017-10-18 ENCOUNTER — Other Ambulatory Visit: Payer: Self-pay | Admitting: Adult Health

## 2017-10-18 ENCOUNTER — Encounter: Payer: Self-pay | Admitting: Adult Health

## 2017-10-18 MED ORDER — FLUOXETINE HCL 60 MG PO TABS
1.0000 | ORAL_TABLET | Freq: Every day | ORAL | 2 refills | Status: DC
Start: 2017-10-18 — End: 2017-12-26

## 2017-11-08 DIAGNOSIS — G250 Essential tremor: Secondary | ICD-10-CM | POA: Insufficient documentation

## 2017-11-08 DIAGNOSIS — R251 Tremor, unspecified: Secondary | ICD-10-CM | POA: Insufficient documentation

## 2017-12-14 ENCOUNTER — Encounter: Payer: Self-pay | Admitting: Adult Health

## 2017-12-15 ENCOUNTER — Encounter: Payer: Self-pay | Admitting: Adult Health

## 2017-12-21 NOTE — Progress Notes (Signed)
Subjective:    Patient ID: Dawn Cox, female    DOB: 1945/11/24, 72 y.o.   MRN: [id number]  HPI:05/25/17 OV:  [name] presents to establish as a new pt. She is a pleasant 72 year old female. PMH: HTN, HLD, Diverticulitis, and recently dx'd with Lupus last year and started on hydroxycholoquine 200mg  BID, denies med SE. She does not have local Rheumatologist. She reports diarrhea twice a week and denies blood in urine/stool.  She feels that diverticulitis flares up 1-2 times/year and uses glycopyrrolate 2mg  PRN. Per pt her last colonoscopy (2017?) was normal and she is recommended to repeat every 5 years, she does not have local GI. She reports body aches, fatigue, non-productive cough, and clear nasal congestion that last 2 days. She denies tobacco/ETOH use  06/22/17 OV: [name] present for CPE. She reports medication compliance and denies SE She denies CP/dyspnea/palpitations, has had some dizziness but feels that it is r/t to her known vertigo. She developed a "rash" around her nose/mouth approx 3 weeks ago.  She denies pain/itching/drainage from area.  She denies this ever occurring before. She plans on increasing walking and calisthenics to at least 3 times/week. She has appt with new Rheumatologist next week.  12/22/17 OV: [name] presents for f/u: HTN, HLD She has established with GI to manage Diverticulitis and Endocrinology to manage Lupus. Her SBP has run 100-110s the last 3 OVs and with her fatigue and dizziness, will decrease amlodipine dosage from 10mg  to 5mg  She reports being on 10mg  dose from "so many years I am unsure how long I have been it on". She estimates to drink >70 oz water and tries to follow heart healthy diet. She has not been walking her normal "mall walking program" and prefers to stay at home playing "internet games" She denies tobacco/ETOH use She reports being a night owl sleeping 0300-1100   Patient Care Team    Relationship Specialty Notifications  Start End  William Hamburger D, NP PCP - General Family Medicine  05/25/17   Crecencio Mc, MD Referring Physician Gastroenterology  05/31/17    Comment: Office address [address] [place], [place] [address]: phone [phone]    Patient Active Problem List   Diagnosis Date Noted  . Depression 12/22/2017  . Perioral dermatitis 06/22/2017  . Diverticulitis 05/25/2017  . Hx of systemic lupus erythematosus (SLE) (HCC) 05/25/2017  . Other fatigue 05/25/2017  . Hypertension 05/25/2017  . Hyperlipidemia 05/25/2017  . Healthcare maintenance 05/25/2017  . Screening for breast cancer 05/25/2017  . Body aches 05/25/2017     Past Medical History:  Diagnosis Date  . Anxiety   . Depression   . Diverticulitis   . Hyperlipidemia   . Hypertension   . Lupus (HCC)   . Retinal detachment   . Retinal hemorrhage      Past Surgical History:  Procedure Laterality Date  . ABDOMINAL HYSTERECTOMY    . BREAST BIOPSY    . BREAST SURGERY Right    cyst removal  . CATARACT EXTRACTION Bilateral   . EYE SURGERY Right    hemorrhagia of retina  . HERNIA REPAIR    . RETINAL DETACHMENT SURGERY       Family History  Problem Relation Age of Onset  . Heart attack Mother   . Liver cancer Father   . Depression Daughter        suicide  . Breast cancer Cousin   . Colon cancer Neg Hx   . Stomach cancer Neg Hx   .  Esophageal cancer Neg Hx   . Rectal cancer Neg Hx      Social History   Substance and Sexual Activity  Drug Use No     Social History   Substance and Sexual Activity  Alcohol Use Yes  . Frequency: Never   Comment: rarely     Social History   Tobacco Use  Smoking Status Never Smoker  Smokeless Tobacco Never Used     Outpatient Encounter Medications as of 12/22/2017  Medication Sig  . albuterol (PROVENTIL HFA;VENTOLIN HFA) 108 (90 Base) MCG/ACT inhaler Inhale 1 puff into the lungs every 6 (six) hours as needed for wheezing or shortness of breath.  . Ascorbic Acid  (VITAMIN C) 1000 MG tablet Take 2,000 mg by mouth daily.  Marland Kitchen aspirin 325 MG tablet Take 325 mg by mouth every 4 (four) hours as needed.  Marland Kitchen atorvastatin (LIPITOR) 20 MG tablet Take 20 mg by mouth daily.  Marland Kitchen FLUoxetine HCl 60 MG TABS Take 1 tablet by mouth daily.  Marland Kitchen glycopyrrolate (ROBINUL) 2 MG tablet Take 2 mg by mouth daily as needed for pain.  . hydroxychloroquine (PLAQUENIL) 200 MG tablet Take 200 mg by mouth 2 (two) times daily.  . Multiple Vitamins-Minerals (HAIR SKIN AND NAILS FORMULA PO) Take 1 tablet by mouth daily.  . Turmeric 500 MG CAPS Take 1,000 mg by mouth daily.  . [DISCONTINUED] amLODipine (NORVASC) 10 MG tablet Take 10 mg by mouth daily.  Marland Kitchen amLODipine (NORVASC) 5 MG tablet Take 1 tablet (5 mg total) by mouth daily.   No facility-administered encounter medications on file as of 12/22/2017.     Allergies: Aloe; Latex; Other; Shellfish allergy; and Vibramycin [doxycycline calcium]  Body mass index is 35.16 kg/m.  Blood pressure 104/68, pulse 72, height 5\' 5"  (1.651 m), weight 211 lb 4.8 oz (95.8 kg), SpO2 99 %.   Review of Systems  Constitutional: Positive for activity change and fatigue. Negative for appetite change, chills, diaphoresis, fever and unexpected weight change.  HENT: Negative for congestion.   Eyes: Negative for visual disturbance.  Respiratory: Negative for cough, chest tightness, shortness of breath, wheezing and stridor.   Cardiovascular: Negative for chest pain, palpitations and leg swelling.  Gastrointestinal: Negative for abdominal distention, abdominal pain, blood in stool, constipation, diarrhea and nausea.  Genitourinary: Negative for difficulty urinating and flank pain.  Musculoskeletal: Positive for arthralgias, joint swelling, myalgias, neck pain and neck stiffness. Negative for back pain and gait problem.  Neurological: Positive for tremors. Negative for dizziness and headaches.  Hematological: Does not bruise/bleed easily.   Psychiatric/Behavioral: Negative for dysphoric mood, hallucinations, self-injury, sleep disturbance and suicidal ideas. The patient is not nervous/anxious and is not hyperactive.        Objective:   Physical Exam  Constitutional: She is oriented to person, place, and time. She appears well-developed and well-nourished. No distress.  Appears quite fatigued  HENT:  Head: Normocephalic and atraumatic.  Right Ear: Hearing, tympanic membrane, external ear and ear canal normal. Tympanic membrane is not erythematous and not bulging. No decreased hearing is noted.  Left Ear: Hearing, tympanic membrane, external ear and ear canal normal. Tympanic membrane is not erythematous and not bulging. No decreased hearing is noted.  Nose: No mucosal edema or rhinorrhea. Right sinus exhibits no maxillary sinus tenderness and no frontal sinus tenderness. Left sinus exhibits no maxillary sinus tenderness and no frontal sinus tenderness.  Mouth/Throat: Uvula is midline, oropharynx is clear and moist and mucous membranes are normal.  Eyes: Pupils are  equal, round, and reactive to light. Conjunctivae and EOM are normal.  Neck: Normal range of motion. Neck supple.  Cardiovascular: Normal rate, regular rhythm and intact distal pulses.  Murmur heard. Pulmonary/Chest: Effort normal and breath sounds normal. No respiratory distress. She has no decreased breath sounds. She has no wheezes. She has no rhonchi. She has no rales. She exhibits no tenderness. Right breast exhibits no inverted nipple, no mass, no nipple discharge, no skin change and no tenderness. Left breast exhibits no inverted nipple, no mass, no nipple discharge, no skin change and no tenderness.  Abdominal: Soft. Bowel sounds are normal. She exhibits no distension and no mass. There is no tenderness. There is no rebound and no guarding.  Musculoskeletal: She exhibits edema and tenderness.  Lymphadenopathy:    She has no cervical adenopathy.  Neurological:  She is alert and oriented to person, place, and time. She displays tremor. She displays no seizure activity. Gait abnormal. Coordination normal.  Fine tremor noted bilaterally in upper extremities - been present >10 years  Skin: Skin is warm and dry. No rash noted. She is not diaphoretic. No erythema. No pallor.  Psychiatric: She has a normal mood and affect. Her behavior is normal. Judgment and thought content normal.  Nursing note and vitals reviewed.      Assessment & Plan:   1. Hypertension, unspecified type   2. Healthcare maintenance   3. Depression, unspecified depression type     Hypertension BP 104/68, HR 72 Her SBP has run 100-110s the last 3 OVs and with her fatigue and dizziness, will decrease amlodipine dosage from 10mg  to 5mg  Please check BP am/pm and call clinic in two weeks with readings If BP runs <100/60 or >140/100 consistently, please call clinic sooner  Healthcare maintenance Decrease Amlodipine from 10mg  to 5mg  once daily. Please check BP am/pm and call clinic in two weeks with readings. If BP runs <100/60 or >140/100 consistently, please call clinic sooner. Resume "mall walking" at least one day every other week.  Try to find local activities, strive to join at least one civic group meeting once/month. We will call you when lab results are available. Continue with Water engineer as directed. Continue to drink plenty of water and follow Mediterranean diet. Please schedule complete physical in 6 months, please schedule fasting labs the week prior.  Depression Mood Stable Currently on Fluoxetine 60mg  QD    FOLLOW-UP:  Return in about 6 months (around 06/23/2018) for CPE, Fasting Labs.

## 2017-12-22 ENCOUNTER — Other Ambulatory Visit: Payer: Self-pay | Admitting: Adult Health

## 2017-12-22 ENCOUNTER — Encounter: Payer: Self-pay | Admitting: Adult Health

## 2017-12-22 ENCOUNTER — Ambulatory Visit: Payer: Federal, State, Local not specified - PPO | Admitting: Adult Health

## 2017-12-22 VITALS — BP 104/68 | HR 72 | Ht 65.0 in | Wt 211.3 lb

## 2017-12-22 DIAGNOSIS — F329 Major depressive disorder, single episode, unspecified: Secondary | ICD-10-CM | POA: Insufficient documentation

## 2017-12-22 DIAGNOSIS — Z0000 Encounter for general adult medical examination without abnormal findings: Secondary | ICD-10-CM

## 2017-12-22 DIAGNOSIS — F32A Depression, unspecified: Secondary | ICD-10-CM

## 2017-12-22 DIAGNOSIS — I10 Essential (primary) hypertension: Secondary | ICD-10-CM

## 2017-12-22 DIAGNOSIS — Z23 Encounter for immunization: Secondary | ICD-10-CM

## 2017-12-22 MED ORDER — AMLODIPINE BESYLATE 5 MG PO TABS: 5.0000 mg | ORAL_TABLET | Freq: Every day | ORAL | 0 refills | Status: DC

## 2017-12-22 NOTE — Patient Instructions (Addendum)
Hypertension Hypertension, commonly called high blood pressure, is when the force of blood pumping through the arteries is too strong. The arteries are the blood vessels that carry blood from the heart throughout the body. Hypertension forces the heart to work harder to pump blood and may cause arteries to become narrow or stiff. Having untreated or uncontrolled hypertension can cause heart attacks, strokes, kidney disease, and other problems. A blood pressure reading consists of a higher number over a lower number. Ideally, your blood pressure should be below 120/80. The first ("top") number is called the systolic pressure. It is a measure of the pressure in your arteries as your heart beats. The second ("bottom") number is called the diastolic pressure. It is a measure of the pressure in your arteries as the heart relaxes. What are the causes? The cause of this condition is not known. What increases the risk? Some risk factors for high blood pressure are under your control. Others are not. Factors you can change  Smoking.  Having type 2 diabetes mellitus, high cholesterol, or both.  Not getting enough exercise or physical activity.  Being overweight.  Having too much fat, sugar, calories, or salt (sodium) in your diet.  Drinking too much alcohol. Factors that are difficult or impossible to change  Having chronic kidney disease.  Having a family history of high blood pressure.  Age. Risk increases with age.  Race. You may be at higher risk if you are African-American.  Gender. Men are at higher risk than women before age 45. After age 65, women are at higher risk than men.  Having obstructive sleep apnea.  Stress. What are the signs or symptoms? Extremely high blood pressure (hypertensive crisis) may cause:  Headache.  Anxiety.  Shortness of breath.  Nosebleed.  Nausea and vomiting.  Severe chest pain.  Jerky movements you cannot control (seizures).  How is this  diagnosed? This condition is diagnosed by measuring your blood pressure while you are seated, with your arm resting on a surface. The cuff of the blood pressure monitor will be placed directly against the skin of your upper arm at the level of your heart. It should be measured at least twice using the same arm. Certain conditions can cause a difference in blood pressure between your right and left arms. Certain factors can cause blood pressure readings to be lower or higher than normal (elevated) for a short period of time:  When your blood pressure is higher when you are in a health care provider's office than when you are at home, this is called white coat hypertension. Most people with this condition do not need medicines.  When your blood pressure is higher at home than when you are in a health care provider's office, this is called masked hypertension. Most people with this condition may need medicines to control blood pressure.  If you have a high blood pressure reading during one visit or you have normal blood pressure with other risk factors:  You may be asked to return on a different day to have your blood pressure checked again.  You may be asked to monitor your blood pressure at home for 1 week or longer.  If you are diagnosed with hypertension, you may have other blood or imaging tests to help your health care provider understand your overall risk for other conditions. How is this treated? This condition is treated by making healthy lifestyle changes, such as eating healthy foods, exercising more, and reducing your alcohol intake. Your   health care provider may prescribe medicine if lifestyle changes are not enough to get your blood pressure under control, and if:  Your systolic blood pressure is above 130.  Your diastolic blood pressure is above 80.  Your personal target blood pressure may vary depending on your medical conditions, your age, and other factors. Follow these  instructions at home: Eating and drinking  Eat a diet that is high in fiber and potassium, and low in sodium, added sugar, and fat. An example eating plan is called the DASH (Dietary Approaches to Stop Hypertension) diet. To eat this way: ? Eat plenty of fresh fruits and vegetables. Try to fill half of your plate at each meal with fruits and vegetables. ? Eat whole grains, such as whole wheat pasta, brown rice, or whole grain bread. Fill about one quarter of your plate with whole grains. ? Eat or drink low-fat dairy products, such as skim milk or low-fat yogurt. ? Avoid fatty cuts of meat, processed or cured meats, and poultry with skin. Fill about one quarter of your plate with lean proteins, such as fish, chicken without skin, beans, eggs, and tofu. ? Avoid premade and processed foods. These tend to be higher in sodium, added sugar, and fat.  Reduce your daily sodium intake. Most people with hypertension should eat less than 1,500 mg of sodium a day.  Limit alcohol intake to no more than 1 drink a day for nonpregnant women and 2 drinks a day for men. One drink equals 12 oz of beer, 5 oz of wine, or 1 oz of hard liquor. Lifestyle  Work with your health care provider to maintain a healthy body weight or to lose weight. Ask what an ideal weight is for you.  Get at least 30 minutes of exercise that causes your heart to beat faster (aerobic exercise) most days of the week. Activities may include walking, swimming, or biking.  Include exercise to strengthen your muscles (resistance exercise), such as pilates or lifting weights, as part of your weekly exercise routine. Try to do these types of exercises for 30 minutes at least 3 days a week.  Do not use any products that contain nicotine or tobacco, such as cigarettes and e-cigarettes. If you need help quitting, ask your health care provider.  Monitor your blood pressure at home as told by your health care provider.  Keep all follow-up visits as  told by your health care provider. This is important. Medicines  Take over-the-counter and prescription medicines only as told by your health care provider. Follow directions carefully. Blood pressure medicines must be taken as prescribed.  Do not skip doses of blood pressure medicine. Doing this puts you at risk for problems and can make the medicine less effective.  Ask your health care provider about side effects or reactions to medicines that you should watch for. Contact a health care provider if:  You think you are having a reaction to a medicine you are taking.  You have headaches that keep coming back (recurring).  You feel dizzy.  You have swelling in your ankles.  You have trouble with your vision. Get help right away if:  You develop a severe headache or confusion.  You have unusual weakness or numbness.  You feel faint.  You have severe pain in your chest or abdomen.  You vomit repeatedly.  You have trouble breathing. Summary  Hypertension is when the force of blood pumping through your arteries is too strong. If this condition is not   controlled, it may put you at risk for serious complications.  Your personal target blood pressure may vary depending on your medical conditions, your age, and other factors. For most people, a normal blood pressure is less than 120/80.  Hypertension is treated with lifestyle changes, medicines, or a combination of both. Lifestyle changes include weight loss, eating a healthy, low-sodium diet, exercising more, and limiting alcohol. This information is not intended to replace advice given to you by your health care provider. Make sure you discuss any questions you have with your health care provider. Document Released: 03/08/2005 Document Revised: 02/04/2016 Document Reviewed: 02/04/2016 Elsevier Interactive Patient Education  2018 [place] refers to food and lifestyle choices that  are based on the traditions of countries located on the [employer]. This way of eating has been shown to help prevent certain conditions and improve outcomes for people who have chronic diseases, like kidney disease and heart disease. What are tips for following this plan? Lifestyle  Cook and eat meals together with your family, when possible.  Drink enough fluid to keep your urine clear or pale yellow.  Be physically active every day. This includes: ? Aerobic exercise like running or swimming. ? Leisure activities like gardening, walking, or housework.  Get 7-8 hours of sleep each night.  If recommended by your health care provider, drink red wine in moderation. This means 1 glass a day for nonpregnant women and 2 glasses a day for men. A glass of wine equals 5 oz (150 mL). Reading food labels  Check the serving size of packaged foods. For foods such as rice and pasta, the serving size refers to the amount of cooked product, not dry.  Check the total fat in packaged foods. Avoid foods that have saturated fat or trans fats.  Check the ingredients list for added sugars, such as corn syrup. Shopping  At the grocery store, buy most of your food from the areas near the walls of the store. This includes: ? Fresh fruits and vegetables (produce). ? Grains, beans, nuts, and seeds. Some of these may be available in unpackaged forms or large amounts (in bulk). ? Fresh seafood. ? Poultry and eggs. ? Low-fat dairy products.  Buy whole ingredients instead of prepackaged foods.  Buy fresh fruits and vegetables in-season from local farmers markets.  Buy frozen fruits and vegetables in resealable bags.  If you do not have access to quality fresh seafood, buy precooked frozen shrimp or canned fish, such as tuna, salmon, or sardines.  Buy small amounts of raw or cooked vegetables, salads, or olives from the deli or salad bar at your store.  Stock your pantry so you always have certain  foods on hand, such as olive oil, canned tuna, canned tomatoes, rice, pasta, and beans. Cooking  Cook foods with extra-virgin olive oil instead of using butter or other vegetable oils.  Have meat as a side dish, and have vegetables or grains as your main dish. This means having meat in small portions or adding small amounts of meat to foods like pasta or stew.  Use beans or vegetables instead of meat in common dishes like chili or lasagna.  Experiment with different cooking methods. Try roasting or broiling vegetables instead of steaming or sauteing them.  Add frozen vegetables to soups, stews, pasta, or rice.  Add nuts or seeds for added healthy fat at each meal. You can add these to yogurt, salads, or vegetable dishes.  Marinate  fish or vegetables using olive oil, lemon juice, garlic, and fresh herbs. Meal planning  Plan to eat 1 vegetarian meal one day each week. Try to work up to 2 vegetarian meals, if possible.  Eat seafood 2 or more times a week.  Have healthy snacks readily available, such as: ? Vegetable sticks with hummus. ? [place] yogurt. ? Fruit and nut trail mix.  Eat balanced meals throughout the week. This includes: ? Fruit: 2-3 servings a day ? Vegetables: 4-5 servings a day ? Low-fat dairy: 2 servings a day ? Fish, poultry, or lean meat: 1 serving a day ? Beans and legumes: 2 or more servings a week ? Nuts and seeds: 1-2 servings a day ? Whole grains: 6-8 servings a day ? Extra-virgin olive oil: 3-4 servings a day  Limit red meat and sweets to only a few servings a month What are my food choices?  Mediterranean diet ? Recommended ? Grains: Whole-grain pasta. Brown rice. Bulgar wheat. Polenta. Couscous. Whole-wheat bread. Orpah Cobb. ? Vegetables: Artichokes. Beets. Broccoli. Cabbage. Carrots. Eggplant. Green beans. Chard. Kale. Spinach. Onions. Leeks. Peas. Squash. Tomatoes. Peppers. Radishes. ? Fruits: Apples. Apricots. Avocado. Berries. Bananas.  Cherries. Dates. Figs. Grapes. Lemons. Melon. Oranges. Peaches. Plums. Pomegranate. ? Meats and other protein foods: Beans. Almonds. Sunflower seeds. Pine nuts. Peanuts. Cod. Salmon. Scallops. Shrimp. Tuna. Tilapia. Clams. Oysters. Eggs. ? Dairy: Low-fat milk. Cheese. Greek yogurt. ? Beverages: Water. Red wine. Herbal tea. ? Fats and oils: Extra virgin olive oil. Avocado oil. Grape seed oil. ? Sweets and desserts: [place] yogurt with honey. Baked apples. Poached pears. Trail mix. ? Seasoning and other foods: Basil. Cilantro. Coriander. Cumin. Mint. Parsley. Sage. Rosemary. Tarragon. Garlic. Oregano. Thyme. Pepper. Balsalmic vinegar. Tahini. Hummus. Tomato sauce. Olives. Mushrooms. ? Limit these ? Grains: Prepackaged pasta or rice dishes. Prepackaged cereal with added sugar. ? Vegetables: Deep fried potatoes (french fries). ? Fruits: Fruit canned in syrup. ? Meats and other protein foods: Beef. Pork. Lamb. Poultry with skin. Hot dogs. Tomasa Blase. ? Dairy: Ice cream. Sour cream. Whole milk. ? Beverages: Juice. Sugar-sweetened soft drinks. Beer. Liquor and spirits. ? Fats and oils: Butter. Canola oil. Vegetable oil. Beef fat (tallow). Lard. ? Sweets and desserts: Cookies. Cakes. Pies. Candy. ? Seasoning and other foods: Mayonnaise. Premade sauces and marinades. ? The items listed may not be a complete list. Talk with your dietitian about what dietary choices are right for you. Summary  The Mediterranean diet includes both food and lifestyle choices.  Eat a variety of fresh fruits and vegetables, beans, nuts, seeds, and whole grains.  Limit the amount of red meat and sweets that you eat.  Talk with your health care provider about whether it is safe for you to drink red wine in moderation. This means 1 glass a day for nonpregnant women and 2 glasses a day for men. A glass of wine equals 5 oz (150 mL). This information is not intended to replace advice given to you by your health care provider. Make  sure you discuss any questions you have with your health care provider. Document Released: 10/30/2015 Document Revised: 12/02/2015 Document Reviewed: 10/30/2015 Elsevier Interactive Patient Education  2018 [employer]  Decrease Amlodipine from 10mg  to 5mg  once daily. Please check BP am/pm and call clinic in two weeks with readings. If BP runs <100/60 or >140/100 consistently, please call clinic sooner. Resume "mall walking" at least one day every other week.  Try to find local activities, strive to join at least one civic  group meeting once/month. We will call you when lab results are available. Continue with Water engineer as directed. Continue to drink plenty of water and follow Mediterranean diet. Please schedule complete physical in 6 months, please schedule fasting labs the week prior. NICE TO SEE YOU!

## 2017-12-22 NOTE — Assessment & Plan Note (Signed)
BP 104/68, HR 72 Her SBP has run 100-110s the last 3 OVs and with her fatigue and dizziness, will decrease amlodipine dosage from 10mg  to 5mg  Please check BP am/pm and call clinic in two weeks with readings If BP runs <100/60 or >140/100 consistently, please call clinic sooner

## 2017-12-22 NOTE — Addendum Note (Signed)
Addended by: Leda Min D on: 12/22/2017 11:26 AM   Modules accepted: Orders

## 2017-12-22 NOTE — Assessment & Plan Note (Signed)
Mood Stable Currently on Fluoxetine 60mg  QD

## 2017-12-22 NOTE — Assessment & Plan Note (Signed)
Decrease Amlodipine from 10mg  to 5mg  once daily. Please check BP am/pm and call clinic in two weeks with readings. If BP runs <100/60 or >140/100 consistently, please call clinic sooner. Resume "mall walking" at least one day every other week.  Try to find local activities, strive to join at least one civic group meeting once/month. We will call you when lab results are available. Continue with Water engineer as directed. Continue to drink plenty of water and follow Mediterranean diet. Please schedule complete physical in 6 months, please schedule fasting labs the week prior.

## 2017-12-23 LAB — COMPREHENSIVE METABOLIC PANEL
A/G RATIO: 1.5 (ref 1.2–2.2)
ALBUMIN: 4.5 g/dL (ref 3.5–4.8)
ALT: 19 IU/L (ref 0–32)
AST: 26 IU/L (ref 0–40)
Alkaline Phosphatase: 79 IU/L (ref 39–117)
BILIRUBIN TOTAL: 0.3 mg/dL (ref 0.0–1.2)
BUN / CREAT RATIO: 13 (ref 12–28)
BUN: 17 mg/dL (ref 8–27)
CHLORIDE: 101 mmol/L (ref 96–106)
CO2: 25 mmol/L (ref 20–29)
Calcium: 9.7 mg/dL (ref 8.7–10.3)
Creatinine, Ser: 1.34 mg/dL — ABNORMAL HIGH (ref 0.57–1.00)
GFR calc non Af Amer: 40 mL/min/{1.73_m2} — ABNORMAL LOW (ref >59)
GFR, EST AFRICAN AMERICAN: 46 mL/min/{1.73_m2} — AB (ref >59)
Globulin, Total: 3.1 g/dL (ref 1.5–4.5)
Glucose: 132 mg/dL — ABNORMAL HIGH (ref 65–99)
POTASSIUM: 4.2 mmol/L (ref 3.5–5.2)
SODIUM: 143 mmol/L (ref 134–144)
TOTAL PROTEIN: 7.6 g/dL (ref 6.0–8.5)

## 2017-12-26 ENCOUNTER — Other Ambulatory Visit: Payer: Self-pay | Admitting: Adult Health

## 2017-12-26 DIAGNOSIS — N1831 Chronic kidney disease, stage 3a: Secondary | ICD-10-CM

## 2017-12-26 DIAGNOSIS — N183 Chronic kidney disease, stage 3 (moderate): Principal | ICD-10-CM

## 2017-12-26 MED ORDER — FLUOXETINE HCL 40 MG PO CAPS: 40.0000 mg | ORAL_CAPSULE | Freq: Every day | ORAL | 3 refills | Status: DC

## 2018-01-10 DIAGNOSIS — E669 Obesity, unspecified: Secondary | ICD-10-CM | POA: Insufficient documentation

## 2018-01-10 DIAGNOSIS — N183 Chronic kidney disease, stage 3 unspecified: Secondary | ICD-10-CM | POA: Insufficient documentation

## 2018-01-21 ENCOUNTER — Emergency Department (HOSPITAL_COMMUNITY): Payer: Federal, State, Local not specified - PPO

## 2018-01-21 ENCOUNTER — Other Ambulatory Visit: Payer: Self-pay

## 2018-01-21 ENCOUNTER — Encounter (HOSPITAL_COMMUNITY): Payer: Self-pay | Admitting: Emergency Medicine

## 2018-01-21 ENCOUNTER — Emergency Department (HOSPITAL_COMMUNITY)
Admission: EM | Admit: 2018-01-21 | Discharge: 2018-01-22 | Disposition: A | Discharge status: Home / Self Care | Payer: Federal, State, Local not specified - PPO | Attending: Emergency Medicine | Admitting: Emergency Medicine

## 2018-01-21 DIAGNOSIS — Y998 Other external cause status: Secondary | ICD-10-CM | POA: Diagnosis not present

## 2018-01-21 DIAGNOSIS — F419 Anxiety disorder, unspecified: Secondary | ICD-10-CM | POA: Diagnosis not present

## 2018-01-21 DIAGNOSIS — Y9389 Activity, other specified: Secondary | ICD-10-CM | POA: Diagnosis not present

## 2018-01-21 DIAGNOSIS — W010XXA Fall on same level from slipping, tripping and stumbling without subsequent striking against object, initial encounter: Secondary | ICD-10-CM | POA: Insufficient documentation

## 2018-01-21 DIAGNOSIS — Y929 Unspecified place or not applicable: Secondary | ICD-10-CM | POA: Insufficient documentation

## 2018-01-21 DIAGNOSIS — Z79899 Other long term (current) drug therapy: Secondary | ICD-10-CM | POA: Diagnosis not present

## 2018-01-21 DIAGNOSIS — F329 Major depressive disorder, single episode, unspecified: Secondary | ICD-10-CM | POA: Diagnosis not present

## 2018-01-21 DIAGNOSIS — Z91040 Latex allergy status: Secondary | ICD-10-CM | POA: Insufficient documentation

## 2018-01-21 DIAGNOSIS — I10 Essential (primary) hypertension: Secondary | ICD-10-CM | POA: Diagnosis not present

## 2018-01-21 DIAGNOSIS — S8261XA Displaced fracture of lateral malleolus of right fibula, initial encounter for closed fracture: Secondary | ICD-10-CM | POA: Diagnosis not present

## 2018-01-21 DIAGNOSIS — S99911A Unspecified injury of right ankle, initial encounter: Secondary | ICD-10-CM | POA: Diagnosis present

## 2018-01-21 DIAGNOSIS — S82891A Other fracture of right lower leg, initial encounter for closed fracture: Secondary | ICD-10-CM

## 2018-01-21 NOTE — ED Triage Notes (Signed)
Pt "stepped wrong" and turned her right ankle injurying her ankle and foot. Can move but it is painful.

## 2018-01-22 MED ORDER — IBUPROFEN 600 MG PO TABS: 600.0000 mg | ORAL_TABLET | Freq: Four times a day (QID) | ORAL | 0 refills | Status: DC | PRN

## 2018-01-22 MED ORDER — OXYCODONE-ACETAMINOPHEN 5-325 MG PO TABS: 1.0000 | ORAL_TABLET | Freq: Three times a day (TID) | ORAL | 0 refills | Status: AC | PRN

## 2018-01-22 MED ORDER — OXYCODONE-ACETAMINOPHEN 5-325 MG PO TABS
2.0000 | ORAL_TABLET | Freq: Once | ORAL | Status: AC
  Administered 2018-01-22: 2 via ORAL
  Filled 2018-01-22: qty 2

## 2018-01-22 NOTE — ED Provider Notes (Signed)
[facility] EMERGENCY DEPARTMENT Provider Note   CSN: [id number] Arrival date & time: 01/21/18  2331     History   Chief Complaint Chief Complaint  Patient presents with  . Ankle Injury    HPI Jossie Smoot is a 72 y.o. female.  HPI  The patient is a pleasant 72 year old female, she has a known history of lupus, hyperlipidemia and hypertension, she is not on any anticoagulants.  She presents this evening after having a fall when she was on the ground playing with 1 of her grandchildren, she tried to stand up and in the process of standing up felt her right ankle twist and felt a popping sensation.  This was acute in onset, it occurred 3 hours prior to my evaluation the pain is been constant and associated with swelling of the right lateral ankle.  She has been unable to bear weight on the right ankle.  She denies any other injuries.  No pain medication given prior to arrival.  She does usually walk with a cane  Past Medical History:  Diagnosis Date  . Anxiety   . Depression   . Diverticulitis   . Hyperlipidemia   . Hypertension   . Lupus (HCC)   . Retinal detachment   . Retinal hemorrhage     Patient Active Problem List   Diagnosis Date Noted  . Depression 12/22/2017  . Perioral dermatitis 06/22/2017  . Diverticulitis 05/25/2017  . Hx of systemic lupus erythematosus (SLE) (HCC) 05/25/2017  . Other fatigue 05/25/2017  . Hypertension 05/25/2017  . Hyperlipidemia 05/25/2017  . Healthcare maintenance 05/25/2017  . Screening for breast cancer 05/25/2017  . Body aches 05/25/2017    Past Surgical History:  Procedure Laterality Date  . ABDOMINAL HYSTERECTOMY    . BREAST BIOPSY    . BREAST SURGERY Right    cyst removal  . CATARACT EXTRACTION Bilateral   . EYE SURGERY Right    hemorrhagia of retina  . HERNIA REPAIR    . RETINAL DETACHMENT SURGERY       OB History   None      Home Medications    Prior to Admission medications   Medication  Sig Start Date End Date Taking? Authorizing Provider  albuterol (PROVENTIL HFA;VENTOLIN HFA) 108 (90 Base) MCG/ACT inhaler Inhale 1 puff into the lungs every 6 (six) hours as needed for wheezing or shortness of breath.    [provider]  amLODipine (NORVASC) 5 MG tablet Take 1 tablet (5 mg total) by mouth daily. 12/22/17   Danford, Orpha Bur D, NP  Ascorbic Acid (VITAMIN C) 1000 MG tablet Take 2,000 mg by mouth daily.    [provider]  aspirin 325 MG tablet Take 325 mg by mouth every 4 (four) hours as needed.    [provider]  atorvastatin (LIPITOR) 20 MG tablet Take 20 mg by mouth daily. 04/06/17   [provider]  FLUoxetine (PROZAC) 40 MG capsule Take 1 capsule (40 mg total) by mouth daily. 12/26/17   Danford, Orpha Bur D, NP  glycopyrrolate (ROBINUL) 2 MG tablet Take 2 mg by mouth daily as needed for pain. 04/16/17   [provider]  hydroxychloroquine (PLAQUENIL) 200 MG tablet Take 200 mg by mouth 2 (two) times daily.    [provider]  ibuprofen (ADVIL,MOTRIN) 600 MG tablet Take 1 tablet (600 mg total) by mouth every 6 (six) hours as needed. 01/22/18   Eber Hong, MD  Multiple Vitamins-Minerals (HAIR SKIN AND NAILS FORMULA PO)  Take 1 tablet by mouth daily.    [provider]  oxyCODONE-acetaminophen (PERCOCET/ROXICET) 5-325 MG tablet Take 1 tablet by mouth every 8 (eight) hours as needed for up to 3 days for severe pain. May take 2 tablets PO q 6 hours for severe pain - Do not take with Tylenol as this tablet already contains tylenol 01/22/18 01/25/18  Eber Hong, MD  Turmeric 500 MG CAPS Take 1,000 mg by mouth daily.    [provider]    Family History Family History  Problem Relation Age of Onset  . Heart attack Mother   . Liver cancer Father   . Depression Daughter        suicide  . Breast cancer Cousin   . Colon cancer Neg Hx   . Stomach cancer Neg Hx   . Esophageal cancer Neg Hx   . Rectal cancer Neg Hx      Social History Social History   Tobacco Use  . Smoking status: Never Smoker  . Smokeless tobacco: Never Used  Substance Use Topics  . Alcohol use: Yes    Frequency: Never    Comment: rarely  . Drug use: No     Allergies   Aloe; Latex; Other; Shellfish allergy; and Vibramycin [doxycycline calcium]   Review of Systems Review of Systems  Musculoskeletal: Positive for arthralgias and joint swelling.  Skin: Negative for rash and wound.  Neurological: Negative for weakness and numbness.     Physical Exam Updated Vital Signs BP 125/72 (BP Location: Left Arm)   Pulse 79   Temp 98.2 F (36.8 C) (Oral)   Resp 18   Ht 1.664 m (5' 5.5")   Wt 95.7 kg   SpO2 100%   BMI 34.58 kg/m   Physical Exam  Constitutional: She appears well-developed and well-nourished. No distress.  HENT:  Head: Normocephalic and atraumatic.  Eyes: Conjunctivae are normal. No scleral icterus.  Cardiovascular: Normal rate, regular rhythm and intact distal pulses.  Pulmonary/Chest: Effort normal and breath sounds normal.  Musculoskeletal: She exhibits tenderness. She exhibits no edema.  There is tenderness and swelling over the right lateral malleolus, decreased range of motion at the ankle, normal pulses at the top of the right foot, there is no tenderness over the proximal fibular head, the patient is able to straight leg raise.  Neurological: She is alert.  No weakness or numbness of the foot  Skin: Skin is warm and dry. No rash noted. She is not diaphoretic.  Nursing note and vitals reviewed.    ED Treatments / Results  Labs (all labs ordered are listed, but only abnormal results are displayed) Labs Reviewed - No data to display  EKG None  Radiology Dg Ankle Complete Right  Result Date: 01/22/2018 CLINICAL DATA:  72 year old female with fall and right ankle pain. EXAM: RIGHT ANKLE - COMPLETE 3+ VIEW; RIGHT FOOT COMPLETE - 3+ VIEW COMPARISON:  None. FINDINGS: There is a minimally  displaced oblique fracture of the lateral malleolus. There is no dislocation. The bones are osteopenic. The soft tissue swelling over the lateral ankle. IMPRESSION: Minimally displaced oblique fracture of the lateral malleolus. Electronically Signed   By: Elgie Collard M.D.   On: 01/22/2018 00:52   Dg Foot Complete Right  Result Date: 01/22/2018 CLINICAL DATA:  72 year old female with fall and right ankle pain. EXAM: RIGHT ANKLE - COMPLETE 3+ VIEW; RIGHT FOOT COMPLETE - 3+ VIEW COMPARISON:  None. FINDINGS: There is a minimally displaced oblique fracture of the lateral malleolus.  There is no dislocation. The bones are osteopenic. The soft tissue swelling over the lateral ankle. IMPRESSION: Minimally displaced oblique fracture of the lateral malleolus. Electronically Signed   By: Elgie Collard M.D.   On: 01/22/2018 00:52    Procedures Procedures (including critical care time)  Medications Ordered in ED Medications  oxyCODONE-acetaminophen (PERCOCET/ROXICET) 5-325 MG per tablet 2 tablet (has no administration in time range)     Initial Impression / Assessment and Plan / ED Course  I have reviewed the triage vital signs and the nursing notes.  Pertinent labs & imaging results that were available during my care of the patient were reviewed by me and considered in my medical decision making (see chart for details).     I have personally viewed the x-ray, on my interpretation I feel that the patient has a slightly displaced fracture of the distal malleolus on the right lateral malleolus.  This is minimally displaced.  The patient will be placed in a splint and have close follow-up with orthopedics.  The joint itself appears stable, the talus is well located underneath the bilateral malleoli.  The patient is agreeable to the plan.  She will be given crutches and a splint as well as a prescription for a wheelchair as she does not feel she will be able to walk with crutches very well.  She can  follow-up with orthopedics outpatient.  This is a closed injury and neurovascular status is normal.  NV checked after splint placed - normal.  Final Clinical Impressions(s) / ED Diagnoses   Final diagnoses:  Closed fracture of right ankle, initial encounter    ED Discharge Orders         Ordered    ibuprofen (ADVIL,MOTRIN) 600 MG tablet  Every 6 hours PRN     01/22/18 0105    oxyCODONE-acetaminophen (PERCOCET/ROXICET) 5-325 MG tablet  Every 8 hours PRN     01/22/18 0105           Eber Hong, MD 01/22/18 1652

## 2018-01-22 NOTE — Progress Notes (Signed)
Orthopedic Tech Progress Note Patient Details:  Dawn Cox [birth year] [id number]  Ortho Devices Type of Ortho Device: Post (short leg) splint, Stirrup splint Ortho Device/Splint Location: rle Ortho Device/Splint Interventions: Ordered, Application, Adjustment   Post Interventions Patient Tolerated: Well Instructions Provided: Care of device, Adjustment of device Pt was unable to safely use crutches. I alerted dr.  [name] 01/22/2018, 1:46 AM

## 2018-01-22 NOTE — Discharge Instructions (Signed)
You have a small fracture of your ankle bone - this should heal well and hopefully without surgery - you will need to use the crutches or the wheelchair but do not walk on your ankle until your surgeon has cleared you for this.    Keep your foot elevated and apply ice over the clothes to reduce swelling and pain  Ibuprofen 3 times daily for pain Oxycocodone with tylenol (Percocet) 1 tablet every 8 hours for severe pain  ER for severe or worsening symptoms.

## 2018-01-22 NOTE — ED Notes (Signed)
EDP at bedside  

## 2018-02-24 DIAGNOSIS — W19XXXA Unspecified fall, initial encounter: Secondary | ICD-10-CM | POA: Insufficient documentation

## 2018-02-24 DIAGNOSIS — R296 Repeated falls: Secondary | ICD-10-CM | POA: Insufficient documentation

## 2018-03-02 ENCOUNTER — Other Ambulatory Visit: Payer: Self-pay | Admitting: Adult Health

## 2018-03-02 ENCOUNTER — Encounter: Payer: Self-pay | Admitting: Adult Health

## 2018-03-03 ENCOUNTER — Other Ambulatory Visit: Payer: Self-pay

## 2018-03-03 MED ORDER — AMLODIPINE BESYLATE 5 MG PO TABS: 5.0000 mg | ORAL_TABLET | Freq: Every day | ORAL | 0 refills | Status: DC

## 2018-03-31 ENCOUNTER — Encounter: Payer: Self-pay | Admitting: Adult Health

## 2018-04-03 ENCOUNTER — Ambulatory Visit (INDEPENDENT_AMBULATORY_CARE_PROVIDER_SITE_OTHER): Payer: Federal, State, Local not specified - PPO | Admitting: Adult Health

## 2018-04-03 ENCOUNTER — Encounter: Payer: Self-pay | Admitting: Adult Health

## 2018-04-03 VITALS — BP 125/70 | HR 73 | Temp 98.2°F | Ht 65.0 in | Wt 216.1 lb

## 2018-04-03 DIAGNOSIS — R944 Abnormal results of kidney function studies: Secondary | ICD-10-CM | POA: Diagnosis not present

## 2018-04-03 DIAGNOSIS — R5383 Other fatigue: Secondary | ICD-10-CM | POA: Diagnosis not present

## 2018-04-03 DIAGNOSIS — Z0000 Encounter for general adult medical examination without abnormal findings: Secondary | ICD-10-CM | POA: Diagnosis not present

## 2018-04-03 DIAGNOSIS — R109 Unspecified abdominal pain: Secondary | ICD-10-CM

## 2018-04-03 DIAGNOSIS — R8290 Unspecified abnormal findings in urine: Secondary | ICD-10-CM | POA: Insufficient documentation

## 2018-04-03 DIAGNOSIS — I10 Essential (primary) hypertension: Secondary | ICD-10-CM | POA: Diagnosis not present

## 2018-04-03 LAB — POCT URINALYSIS DIPSTICK
BILIRUBIN UA: NEGATIVE
GLUCOSE UA: NEGATIVE
KETONES UA: NEGATIVE
Nitrite, UA: NEGATIVE
Protein, UA: POSITIVE — AB
Spec Grav, UA: >=1.03 — AB (ref 1.010–1.025)
Urobilinogen, UA: 1 E.U./dL
pH, UA: 5.5 (ref 5.0–8.0)

## 2018-04-03 NOTE — Assessment & Plan Note (Signed)
Urinalysis shows blood and small amount of leukocytes in urine. Specimen sent for culture/sensitivty, once results are available we will call and if indicated will start you on antibiotic.  Please continue all medications as directed.

## 2018-04-03 NOTE — Progress Notes (Signed)
Subjective:    Patient ID: Trudie ReedJohann Clary, female    DOB: November 12, 1945, 73 y.o.   MRN: [id number]  HPI:  Ms. Lucretia RoersWood presents with lumbar back pain that started 48 hrs ago. Pain is localized to L Lumbar, constant aching throb, rated 8/10. She reports intermittent radiation from L lumbar to LLQ. She denies acute accident/injury prior to onset of pain. She also reports night sweats, chills, and urinary frequency the last 2 days, denies dysuria. She denies constipation/diarrhea She denies hematuria/hematochezia She had non-productive cough, nasal drainage last week that has since resolved. She denies fever/N/V/D She denies CP/dyspnea/dizziness She reports remote hx of kidney stones   Patient Care Team    Relationship Specialty Notifications Start End  William Hamburgeranford, Bowe Sidor D, NP PCP - General Family Medicine  05/25/17   Crecencio McKodali, Valli, MD Referring Physician Gastroenterology  05/31/17    Comment: Office address [address] [place], [place] [address]: phone [phone]    Patient Active Problem List   Diagnosis Date Noted  . Abnormal urinalysis 04/03/2018  . Depression 12/22/2017  . Perioral dermatitis 06/22/2017  . Diverticulitis 05/25/2017  . Hx of systemic lupus erythematosus (SLE) (HCC) 05/25/2017  . Other fatigue 05/25/2017  . Hypertension 05/25/2017  . Hyperlipidemia 05/25/2017  . Healthcare maintenance 05/25/2017  . Screening for breast cancer 05/25/2017  . Body aches 05/25/2017     Past Medical History:  Diagnosis Date  . Ankle fracture    right  . Anxiety   . Depression   . Diverticulitis   . Hyperlipidemia   . Hypertension   . Lupus (HCC)   . Retinal detachment   . Retinal hemorrhage      Past Surgical History:  Procedure Laterality Date  . ABDOMINAL HYSTERECTOMY    . BREAST BIOPSY    . BREAST SURGERY Right    cyst removal  . CATARACT EXTRACTION Bilateral   . EYE SURGERY Right    hemorrhagia of retina  . HERNIA REPAIR    . RETINAL DETACHMENT SURGERY        Family History  Problem Relation Age of Onset  . Heart attack Mother   . Liver cancer Father   . Depression Daughter        suicide  . Breast cancer Cousin   . Colon cancer Neg Hx   . Stomach cancer Neg Hx   . Esophageal cancer Neg Hx   . Rectal cancer Neg Hx      Social History   Substance and Sexual Activity  Drug Use No     Social History   Substance and Sexual Activity  Alcohol Use Yes  . Frequency: Never   Comment: rarely     Social History   Tobacco Use  Smoking Status Never Smoker  Smokeless Tobacco Never Used     Outpatient Encounter Medications as of 04/03/2018  Medication Sig  . albuterol (PROVENTIL HFA;VENTOLIN HFA) 108 (90 Base) MCG/ACT inhaler Inhale 1 puff into the lungs every 6 (six) hours as needed for wheezing or shortness of breath.  Marland Kitchen. amLODipine (NORVASC) 5 MG tablet Take 1 tablet (5 mg total) by mouth daily.  . Ascorbic Acid (VITAMIN C) 1000 MG tablet Take 2,000 mg by mouth daily.  Marland Kitchen. aspirin 325 MG tablet Take 325 mg by mouth every 4 (four) hours as needed.  Marland Kitchen. atorvastatin (LIPITOR) 20 MG tablet Take 20 mg by mouth daily.  Marland Kitchen. FLUoxetine (PROZAC) 40 MG capsule Take 1 capsule (40 mg total) by mouth daily.  Marland Kitchen. glycopyrrolate (ROBINUL)  2 MG tablet Take 2 mg by mouth daily as needed for pain.  . hydroxychloroquine (PLAQUENIL) 200 MG tablet Take 200 mg by mouth 2 (two) times daily.  Marland Kitchen. ibuprofen (ADVIL,MOTRIN) 600 MG tablet Take 1 tablet (600 mg total) by mouth every 6 (six) hours as needed.  . Multiple Vitamins-Minerals (HAIR SKIN AND NAILS FORMULA PO) Take 1 tablet by mouth daily.  . Turmeric 500 MG CAPS Take 1,000 mg by mouth daily.   No facility-administered encounter medications on file as of 04/03/2018.     Allergies: Aloe; Latex; Other; Shellfish allergy; and Vibramycin [doxycycline calcium]  Body mass index is 35.96 kg/m.  Blood pressure 125/70, pulse 73, temperature 98.2 F (36.8 C), temperature source Oral, height 5\' 5"  (1.651  m), weight 216 lb 1.6 oz (98 kg), SpO2 98 %.   Review of Systems  Constitutional: Positive for fatigue. Negative for activity change, appetite change, chills, diaphoresis, fever and unexpected weight change.  HENT: Negative for congestion.   Respiratory: Negative for cough, chest tightness, shortness of breath, wheezing and stridor.   Cardiovascular: Negative for chest pain, palpitations and leg swelling.  Gastrointestinal: Negative for abdominal distention, abdominal pain, blood in stool, constipation, diarrhea, nausea and vomiting.  Genitourinary: Positive for frequency. Negative for difficulty urinating.  Musculoskeletal: Positive for arthralgias, back pain, gait problem, joint swelling, myalgias, neck pain and neck stiffness.  Skin: Negative for color change, pallor, rash and wound.  Neurological: Negative for dizziness and headaches.  Hematological: Does not bruise/bleed easily.       Objective:   Physical Exam Vitals signs and nursing note reviewed.  Constitutional:      General: She is not in acute distress.    Appearance: She is not ill-appearing, toxic-appearing or diaphoretic.  HENT:     Head: Normocephalic and atraumatic.     Nose: Nose normal.  Eyes:     Extraocular Movements: Extraocular movements intact.     Conjunctiva/sclera: Conjunctivae normal.     Pupils: Pupils are equal, round, and reactive to light.  Neck:     Musculoskeletal: Normal range of motion.  Cardiovascular:     Rate and Rhythm: Normal rate.     Pulses: Normal pulses.     Heart sounds: Normal heart sounds.  Pulmonary:     Effort: Pulmonary effort is normal. No respiratory distress.     Breath sounds: Normal breath sounds. No stridor. No wheezing, rhonchi or rales.  Chest:     Chest wall: No tenderness.  Abdominal:     General: There is no distension.     Palpations: There is no mass.     Tenderness: There is no abdominal tenderness. There is left CVA tenderness. There is no guarding.   Skin:    General: Skin is warm and dry.     Capillary Refill: Capillary refill takes less than 2 seconds.  Neurological:     Mental Status: She is alert and oriented to person, place, and time.  Psychiatric:        Mood and Affect: Mood normal.        Behavior: Behavior normal.        Thought Content: Thought content normal.        Judgment: Judgment normal.       Assessment & Plan:   1. Healthcare maintenance   2. Decreased GFR   3. Essential hypertension   4. Other fatigue   5. Abnormal urinalysis   6. Flank pain     Abnormal urinalysis UA:  Blood Moderate Nitrate Neg Leu Small Only mild frequency, will await C/S results for tx  Healthcare maintenance Urinalysis shows blood and small amount of leukocytes in urine. Specimen sent for culture/sensitivty, once results are available we will call and if indicated will start you on antibiotic.  Please continue all medications as directed.    FOLLOW-UP:  Return if symptoms worsen or fail to improve.

## 2018-04-03 NOTE — Patient Instructions (Addendum)
Urine Culture and Sensitivity Testing Why am I having this test? A urine culture is a test to see if bacteria or yeast grow from your urine sample. Normally, urine is mostly germ-free. Bacteria or yeast in the urine may cause a urinary tract infection (UTI). You may have this test:  If you have symptoms of a UTI, such as: ? Frequent urination or passing small amounts of urine frequently. ? Burning or pain when urinating. ? Needing to urinate urgently.  If you are pregnant. Pregnant women are at increased risk for UTIs and are screened for UTIs routinely. What is being tested? This test checks whether any of the following are present in your urine:  Bacteria.  Yeast. What kind of sample is taken? A urine sample is required for this test. The urine must be collected in a way that prevents the bacteria that is always on the skin (normal flora) from getting into the sample. There are two ways to do this:  The clean-catch method. This is the most common way of getting a clean urine sample. You may collect a clean-catch sample at home or at the lab. Your health care provider may give you sterile wipes to clean your vagina or penis to prepare for collecting a clean-catch sample.  Inserting a small, thin tube (catheter) into the part of the body that drains urine from the bladder (urethra). This method allows urine to be collected directly from the bladder. ? In women, the urethral opening is just above the vaginal opening. ? In men, the urethra opens at the tip of the penis. How do I prepare for this test?  Do not urinate for about an hour before collecting the sample.  Drink a glass of water about 20 minutes before collecting the sample. What happens during the test? Your urine sample will be placed onto plates that contain a substance that encourages bacteria and yeast to grow (agar plates). These plates will be kept at body temperature for 24-48 hours to see if bacteria, yeast, or other  germs grow. Then, the plates will be examined under a microscope. Any bacteria or yeast that grows from the culture will be tested against a variety of medicines to find the medicine that works best (sensitivity testing). For a UTI caused by bacteria, several types of antibiotic medicines may be tested. How are the results reported? Your culture test results will be reported as either positive or negative. Your sensitivity test results will be reported as a list of medicines that can be used to treat your infection. What do the results mean? A positive test result means that bacteria or yeast grew from your urine sample. This may mean that you have a UTI, and you may need to start taking antibiotic or antifungal medicines based on your sensitivity test results. A negative test result means that no or few bacteria or yeast grew from your sample after 24-48 hours. This means that it is less likely that you have a UTI. If you still have symptoms, your test may be repeated. A contaminated test result means that many different types of bacteria or yeast grew in your urine sample, and your sample most likely has normal flora in it. This sample cannot be used to make a diagnosis, and your test may need to be repeated. Talk with your health care provider about what your results mean. Questions to ask your health care provider Ask your health care provider, or the department that is doing the test:    When will my results be ready?  How will I get my results?  What are my treatment options?  What other tests do I need?  What are my next steps? Summary  A urine culture is a test to see if bacteria or yeast grow from your urine sample.  A urine sample may be collected using the clean-catch method or a urinary catheter.  Your urine sample will be placed onto plates that contain a substance that encourages bacteria and yeast to grow.  Any bacteria or yeast that grows from the culture will be tested  against a variety of medicines to find the medicine that works best (sensitivity testing). This information is not intended to replace advice given to you by your health care provider. Make sure you discuss any questions you have with your health care provider. Document Released: 04/02/2004 Document Revised: 11/18/2016 Document Reviewed: 11/18/2016 Elsevier Interactive Patient Education  2019 [employer].   Urinalysis shows blood and small amount of leukocytes in urine. Specimen sent for culture/sensitivty, once results are available we will call and if indicated will start you on antibiotic.  Please continue all medications as directed. NICE TO SEE YOU!

## 2018-04-03 NOTE — Assessment & Plan Note (Signed)
UA: Blood Moderate Nitrate Neg Leu Small Only mild frequency, will await C/S results for tx

## 2018-04-04 LAB — COMPREHENSIVE METABOLIC PANEL
ALT: 17 IU/L (ref 0–32)
AST: 22 IU/L (ref 0–40)
Albumin/Globulin Ratio: 1.4 (ref 1.2–2.2)
Albumin: 4.3 g/dL (ref 3.5–4.8)
Alkaline Phosphatase: 69 IU/L (ref 39–117)
BUN/Creatinine Ratio: 15 (ref 12–28)
BUN: 19 mg/dL (ref 8–27)
Bilirubin Total: 0.3 mg/dL (ref 0.0–1.2)
CHLORIDE: 99 mmol/L (ref 96–106)
CO2: 22 mmol/L (ref 20–29)
CREATININE: 1.3 mg/dL — AB (ref 0.57–1.00)
Calcium: 9.2 mg/dL (ref 8.7–10.3)
GFR calc non Af Amer: 41 mL/min/{1.73_m2} — ABNORMAL LOW (ref >59)
GFR, EST AFRICAN AMERICAN: 47 mL/min/{1.73_m2} — AB (ref >59)
GLUCOSE: 117 mg/dL — AB (ref 65–99)
Globulin, Total: 3 g/dL (ref 1.5–4.5)
Potassium: 4.3 mmol/L (ref 3.5–5.2)
Sodium: 138 mmol/L (ref 134–144)
Total Protein: 7.3 g/dL (ref 6.0–8.5)

## 2018-04-04 LAB — LIPID PANEL
CHOLESTEROL TOTAL: 204 mg/dL — AB (ref 100–199)
Chol/HDL Ratio: 4.2 ratio (ref 0.0–4.4)
HDL: 49 mg/dL (ref >39)
LDL CALC: 129 mg/dL — AB (ref 0–99)
Triglycerides: 130 mg/dL (ref 0–149)
VLDL CHOLESTEROL CAL: 26 mg/dL (ref 5–40)

## 2018-04-04 LAB — TSH: TSH: 3.12 u[IU]/mL (ref 0.450–4.500)

## 2018-04-05 ENCOUNTER — Telehealth: Payer: Self-pay | Admitting: Adult Health

## 2018-04-05 NOTE — Telephone Encounter (Signed)
Spoke with pt and advised her that U/C is still pending, but thus far it appears not to indicate a UTI.  Advised pt we will contact her after the lab is finalized.  Pt expressed understanding.  Dawn Cox. Nelson, CMA

## 2018-04-05 NOTE — Telephone Encounter (Signed)
Pt's husband called states patient is still in pain since Monday, Jan 13th visit & they wanted to know of lab results are back.--- Forwarding message to medical assistant for review w/provider and call back to patient.  --glh

## 2018-04-06 ENCOUNTER — Ambulatory Visit: Payer: Federal, State, Local not specified - PPO | Admitting: Adult Health

## 2018-04-06 LAB — CULTURE, URINE COMPREHENSIVE

## 2018-04-08 ENCOUNTER — Other Ambulatory Visit: Payer: Self-pay | Admitting: Adult Health

## 2018-04-10 ENCOUNTER — Other Ambulatory Visit: Payer: Self-pay | Admitting: Adult Health

## 2018-04-10 NOTE — Telephone Encounter (Signed)
We have not prescribed these medications for the patient previously.  Please review and refill if appropriate.  T. Kirsti Mcalpine, CMA  

## 2018-04-21 ENCOUNTER — Encounter: Payer: Self-pay | Admitting: Adult Health

## 2018-05-05 ENCOUNTER — Other Ambulatory Visit: Payer: Self-pay | Admitting: Adult Health

## 2018-06-10 ENCOUNTER — Other Ambulatory Visit: Payer: Self-pay | Admitting: Adult Health

## 2018-06-12 ENCOUNTER — Other Ambulatory Visit: Payer: Self-pay | Admitting: Adult Health

## 2018-06-12 ENCOUNTER — Telehealth: Payer: Self-pay | Admitting: Gastroenterology

## 2018-06-12 ENCOUNTER — Other Ambulatory Visit: Payer: Self-pay | Admitting: Gastroenterology

## 2018-06-12 NOTE — Telephone Encounter (Signed)
Pt would like to know if she can get a med for glycopyrrolate (ROBINUL).. She had changed from another GI doctor to [place]... I did tell her that the doctor would not be able to since he has never prescribed it to her. Pt said that at her last visit he told her that he could.. would like to know if she can and if so to be sent to:    in [facility] [address] [place]

## 2018-06-12 NOTE — Telephone Encounter (Signed)
We have not prescribed these medications for the patient previously.  Please review and refill if appropriate.  T. Nelson, CMA  

## 2018-06-13 NOTE — Telephone Encounter (Signed)
Dr. Myrtie Neither,  Are you ok to send in Robinul. Pt seen 09/2017 as a new pt

## 2018-06-14 ENCOUNTER — Other Ambulatory Visit: Payer: Self-pay | Admitting: Adult Health

## 2018-06-14 NOTE — Telephone Encounter (Signed)
I am not sure I quite understand.  Is the patient still living in this area?  The request is to send the prescription to a pharmacy in [place], and I am not accustomed to send prescriptions to pharmacies out of state.

## 2018-06-19 ENCOUNTER — Telehealth: Payer: Self-pay | Admitting: Gastroenterology

## 2018-06-19 ENCOUNTER — Other Ambulatory Visit: Payer: Self-pay | Admitting: Adult Health

## 2018-06-19 MED ORDER — GLYCOPYRROLATE 2 MG PO TABS: 2.0000 mg | ORAL_TABLET | Freq: Two times a day (BID) | ORAL | 0 refills | Status: DC

## 2018-06-19 NOTE — Telephone Encounter (Signed)
I sent the prescription.  Need to see patient in clinic June/July when we are hopefully pass this epidemic.

## 2018-06-19 NOTE — Telephone Encounter (Signed)
Pt is calling in to get medication re filled

## 2018-06-19 NOTE — Telephone Encounter (Signed)
Recall has been placed.

## 2018-06-19 NOTE — Telephone Encounter (Signed)
Refill request for Robinal 2 mg one po daily or as needed. Last seen 09-2017. Please advise on refills. It looks like there is another phone note with request to go to a out of state pharmacy. This is incorrect and needs to go to [facility]- on [address].

## 2018-06-20 NOTE — Telephone Encounter (Signed)
Resoled. See other message.

## 2018-08-10 ENCOUNTER — Other Ambulatory Visit: Payer: Self-pay | Admitting: Adult Health

## 2018-08-22 ENCOUNTER — Telehealth: Payer: Self-pay

## 2018-08-22 MED ORDER — GLYCOPYRROLATE 2 MG PO TABS: 2.0000 mg | ORAL_TABLET | Freq: Two times a day (BID) | ORAL | 0 refills | Status: DC

## 2018-08-22 NOTE — Telephone Encounter (Addendum)
Incoming fax request for Glycopyrrolate 1 mg BID. Follow up scheduled for phone visit on 09-12-2018 @ 10 am tt

## 2018-08-22 NOTE — Telephone Encounter (Signed)
Done.  Rhina Brackett to the [employer] [facility]. Telemedicine follow-up with me on 6/23 noted.

## 2018-08-28 NOTE — Progress Notes (Signed)
Subjective:    Patient ID: Dawn Cox, female    DOB: 1945/04/30, 73 y.o.   MRN: [id number]  HPI:  [name] presents for CPE PMH:  HTN, HLD, Depression, SLE- followed by Rhuematology She reports medication compliance, denies SE She has gained 10 lbs since last OV in Jan 2020 Current wt 226 Since COVID-19 pandemic started, she has not been walking or performing in any form of exercise. She reports increase in snacking.  Last seen by Nephrology- 04/17/2018: No change to medication regime, increase water intake, ambulatory BP monitoring, Low salt diet, increase exercise, encouraged wt loss F/u 10/16/2018  04/03/2018 Labs: CMP- stable  re: CKD TSH-WNL, 3.120 The 10-year ASCVD risk score Mikey Bussing DC Jr., et al., 2013) is: 12.7% Values used to calculate the score:  Age: 23 years  Sex: Female  Is Non-Hispanic African American: Yes  Diabetic: No  Tobacco smoker: No  Systolic Blood Pressure: 242 mmHg  Is BP treated: Yes  HDL Cholesterol: 49 mg/dL  Total Cholesterol: 204 mg/dL LDL-129 Encouraged to reduce saturated fat and increase regular exercise  Healthcare Maintenance: PAP- N/A, re: hysterectomy Mammogram-ordered Immunizations-Pt to follow-up Colonoscopy-UTD, las 05/08/2014   Patient Care Team    Relationship Specialty Notifications Start End  Mina Marble D, NP PCP - General Family Medicine  05/25/17   Floyce Stakes, MD Referring Physician Gastroenterology  05/31/17    Comment: Office address [address] [place], [place] [address]: phone [phone]    Patient Active Problem List   Diagnosis Date Noted  . Blood glucose elevated 08/29/2018  . Abnormal urinalysis 04/03/2018  . Depression 12/22/2017  . Perioral dermatitis 06/22/2017  . Diverticulitis 05/25/2017  . Hx of systemic lupus erythematosus (SLE) ([place]) 05/25/2017  . Other fatigue 05/25/2017  . Hypertension 05/25/2017  . Hyperlipidemia 05/25/2017  . Healthcare maintenance  05/25/2017  . Screening for breast cancer 05/25/2017  . Body aches 05/25/2017     Past Medical History:  Diagnosis Date  . Ankle fracture    right  . Anxiety   . Depression   . Diverticulitis   . Hyperlipidemia   . Hypertension   . Lupus ([place])   . Retinal detachment   . Retinal hemorrhage      Past Surgical History:  Procedure Laterality Date  . ABDOMINAL HYSTERECTOMY    . BREAST BIOPSY    . BREAST SURGERY Right    cyst removal  . CATARACT EXTRACTION Bilateral   . EYE SURGERY Right    hemorrhagia of retina  . HERNIA REPAIR    . RETINAL DETACHMENT SURGERY       Family History  Problem Relation Age of Onset  . Heart attack Mother   . Liver cancer Father   . Depression Daughter        suicide  . Breast cancer Cousin   . Colon cancer Neg Hx   . Stomach cancer Neg Hx   . Esophageal cancer Neg Hx   . Rectal cancer Neg Hx      Social History   Substance and Sexual Activity  Drug Use No     Social History   Substance and Sexual Activity  Alcohol Use Yes  . Frequency: Never   Comment: rarely     Social History   Tobacco Use  Smoking Status Never Smoker  Smokeless Tobacco Never Used     Outpatient Encounter Medications as of 08/29/2018  Medication Sig  . albuterol (PROVENTIL HFA;VENTOLIN HFA) 108 (90 Base) MCG/ACT inhaler Inhale 1 puff into  the lungs every 6 (six) hours as needed for wheezing or shortness of breath.  Marland Kitchen. amLODipine (NORVASC) 5 MG tablet Take 1 tablet (5 mg total) by mouth daily. PATIENT NEEDS OFFICE VISIT AND LABS PRIOR TO ANY FURTHER REFILLS  . aspirin 325 MG tablet Take 325 mg by mouth every 4 (four) hours as needed.  Marland Kitchen. atorvastatin (LIPITOR) 20 MG tablet TAKE 1 TABLET BY MOUTH EVERY DAY  . FLUoxetine (PROZAC) 40 MG capsule Take 1 capsule (40 mg total) by mouth daily.  Marland Kitchen. glycopyrrolate (ROBINUL) 2 MG tablet Take 1 tablet (2 mg total) by mouth 2 (two) times daily.  . hydroxychloroquine (PLAQUENIL) 200 MG tablet Take 200 mg by mouth  2 (two) times daily.  . Multiple Vitamins-Minerals (HAIR SKIN AND NAILS FORMULA PO) Take 1 tablet by mouth daily.  . Turmeric 500 MG CAPS Take 1,000 mg by mouth daily.  . [DISCONTINUED] Ascorbic Acid (VITAMIN C) 1000 MG tablet Take 2,000 mg by mouth daily.  . [DISCONTINUED] ibuprofen (ADVIL,MOTRIN) 600 MG tablet Take 1 tablet (600 mg total) by mouth every 6 (six) hours as needed.   No facility-administered encounter medications on file as of 08/29/2018.     Allergies: Aloe; Latex; Other; Shellfish allergy; and Vibramycin [doxycycline calcium]  Body mass index is 38.91 kg/m.  Blood pressure 121/74, pulse 71, temperature 98.3 F (36.8 C), temperature source Oral, height 5\' 4"  (1.626 m), weight 226 lb 11.2 oz (102.8 kg), SpO2 97 %.  Review of Systems  Constitutional: Positive for fatigue. Negative for activity change, appetite change, chills, diaphoresis, fever and unexpected weight change.  HENT: Positive for congestion, postnasal drip and rhinorrhea.   Respiratory: Negative for cough, chest tightness, shortness of breath, wheezing and stridor.   Cardiovascular: Negative for chest pain, palpitations and leg swelling.  Gastrointestinal: Positive for abdominal pain. Negative for abdominal distention, anal bleeding, blood in stool, constipation, diarrhea, nausea and vomiting.       Abd pain r/t chronic IBS  Endocrine: Negative for cold intolerance, heat intolerance, polydipsia, polyphagia and polyuria.  Genitourinary: Negative for difficulty urinating and flank pain.  Musculoskeletal: Positive for arthralgias, back pain, gait problem, joint swelling, myalgias, neck pain and neck stiffness.  Skin: Negative for color change, pallor, rash and wound.  Neurological: Positive for headaches. Negative for dizziness.  Hematological: Negative for adenopathy. Does not bruise/bleed easily.  Psychiatric/Behavioral: Negative for agitation, behavioral problems, confusion, decreased concentration, dysphoric  mood, hallucinations, self-injury, sleep disturbance and suicidal ideas. The patient is not nervous/anxious and is not hyperactive.        Objective:   Physical Exam Vitals signs and nursing note reviewed.  Constitutional:      General: She is not in acute distress.    Appearance: Normal appearance. She is not ill-appearing, toxic-appearing or diaphoretic.  HENT:     Head: Normocephalic and atraumatic.     Right Ear: Tympanic membrane, ear canal and external ear normal.     Left Ear: Ear canal and external ear normal.     Nose: Congestion and rhinorrhea present.  Eyes:     Extraocular Movements: Extraocular movements intact.     Conjunctiva/sclera: Conjunctivae normal.     Pupils: Pupils are equal, round, and reactive to light.  Neck:     Musculoskeletal: Normal range of motion.  Cardiovascular:     Rate and Rhythm: Normal rate.     Pulses: Normal pulses.     Heart sounds: Normal heart sounds. No murmur. No friction rub. No gallop.  Comments: Declined breast examination  Pulmonary:     Effort: Pulmonary effort is normal. No respiratory distress.     Breath sounds: Normal breath sounds. No stridor. No wheezing, rhonchi or rales.  Chest:     Chest wall: No tenderness.  Abdominal:     General: Abdomen is protuberant. There is no distension.     Palpations: Abdomen is soft.     Tenderness: There is no abdominal tenderness. There is no right CVA tenderness, left CVA tenderness, guarding or rebound. Negative signs include Murphy's sign, Rovsing's sign, McBurney's sign, psoas sign and obturator sign.     Hernia: No hernia is present.  Musculoskeletal: Normal range of motion.        General: No swelling.  Lymphadenopathy:     Cervical: No cervical adenopathy.  Skin:    General: Skin is warm and dry.     Capillary Refill: Capillary refill takes less than 2 seconds.  Neurological:     Mental Status: She is alert and oriented to person, place, and time.  Psychiatric:         Mood and Affect: Mood normal.        Behavior: Behavior normal.        Thought Content: Thought content normal.        Judgment: Judgment normal.       Assessment & Plan:   1. Healthcare maintenance   2. Screening for breast cancer   3. Hypertension, unspecified type   4. Blood glucose elevated     Healthcare maintenance A1c- 6.2, which is considered pre-diabetic, please work on reducing sugar and carbohydrate intake. Recommend re-checking in 3 months, Avoid Aspirin and NSAIDs for pain management due to your kidney disease. Follow DASH diet. Increase regular exercise.  Recommend at least 30 minutes daily, 5 days per week of walking, jogging, biking, swimming, YouTube/Pinterest workout videos.  Continue to monitor and record home BP. Focus on weight loss. Keep follow-up with Nephrology 10/16/2018 Please call your Rheumatologist and schedule a follow-up. Follow-up with your Gastroenterologist as needed. Follow-up here in 63 months, please come fasting. Continue to social distance and wear a mask when in public.  Hypertension BP at goal 121/74, HR 71 Continue Amlodipine 5mg  QD Followed by Nephrology, next OV 10/16/2018  Blood glucose elevated Lab Results  Component Value Date   HGBA1C 6.2 (A) 08/29/2018   HGBA1C 5.9 (H) 06/22/2017  Encouraged to reduce CHO/sugar and increase regular exercise     FOLLOW-UP:  Return in about 3 months (around 11/29/2018) for HTN, Obesity, Hypercholestermia, Fasting Labs.

## 2018-08-29 ENCOUNTER — Other Ambulatory Visit: Payer: Self-pay

## 2018-08-29 ENCOUNTER — Encounter: Payer: Self-pay | Admitting: Adult Health

## 2018-08-29 ENCOUNTER — Ambulatory Visit (INDEPENDENT_AMBULATORY_CARE_PROVIDER_SITE_OTHER): Payer: Federal, State, Local not specified - PPO | Admitting: Adult Health

## 2018-08-29 VITALS — BP 121/74 | HR 71 | Temp 98.3°F | Ht 64.0 in | Wt 226.7 lb

## 2018-08-29 DIAGNOSIS — Z1239 Encounter for other screening for malignant neoplasm of breast: Secondary | ICD-10-CM

## 2018-08-29 DIAGNOSIS — Z0001 Encounter for general adult medical examination with abnormal findings: Secondary | ICD-10-CM

## 2018-08-29 DIAGNOSIS — Z0000 Encounter for general adult medical examination without abnormal findings: Secondary | ICD-10-CM

## 2018-08-29 DIAGNOSIS — E785 Hyperlipidemia, unspecified: Secondary | ICD-10-CM

## 2018-08-29 DIAGNOSIS — R739 Hyperglycemia, unspecified: Secondary | ICD-10-CM | POA: Diagnosis not present

## 2018-08-29 DIAGNOSIS — I10 Essential (primary) hypertension: Secondary | ICD-10-CM

## 2018-08-29 LAB — POCT GLYCOSYLATED HEMOGLOBIN (HGB A1C): Hemoglobin A1C: 6.2 % — AB (ref 4.0–5.6)

## 2018-08-29 NOTE — Patient Instructions (Addendum)
Preventive Care for Adults, Female  A healthy lifestyle and preventive care can promote health and wellness. Preventive health guidelines for women include the following key practices.   A routine yearly physical is a good way to check with your health care provider about your health and preventive screening. It is a chance to share any concerns and updates on your health and to receive a thorough exam.   Visit your dentist for a routine exam and preventive care every 6 months. Brush your teeth twice a day and floss once a day. Good oral hygiene prevents tooth decay and gum disease.   The frequency of eye exams is based on your age, health, family medical history, use of contact lenses, and other factors. Follow your health care provider's recommendations for frequency of eye exams.   Eat a healthy diet. Foods like vegetables, fruits, whole grains, low-fat dairy products, and lean protein foods contain the nutrients you need without too many calories. Decrease your intake of foods high in solid fats, added sugars, and salt. Eat the right amount of calories for you.Get information about a proper diet from your health care provider, if necessary.   Regular physical exercise is one of the most important things you can do for your health. Most adults should get at least 150 minutes of moderate-intensity exercise (any activity that increases your heart rate and causes you to sweat) each week. In addition, most adults need muscle-strengthening exercises on 2 or more days a week.   Maintain a healthy weight. The body mass index (BMI) is a screening tool to identify possible weight problems. It provides an estimate of body fat based on height and weight. Your health care provider can find your BMI, and can help you achieve or maintain a healthy weight.For adults 20 years and older:   - A BMI below 18.5 is considered underweight.   - A BMI of 18.5 to 24.9 is normal.   - A BMI of 25 to 29.9 is  considered overweight.   - A BMI of 30 and above is considered obese.   Maintain normal blood lipids and cholesterol levels by exercising and minimizing your intake of trans and saturated fats.  Eat a balanced diet with plenty of fruit and vegetables. Blood tests for lipids and cholesterol should begin at age 20 and be repeated every 5 years minimum.  If your lipid or cholesterol levels are high, you are over 40, or you are at high risk for heart disease, you may need your cholesterol levels checked more frequently.Ongoing high lipid and cholesterol levels should be treated with medicines if diet and exercise are not working.   If you smoke, find out from your health care provider how to quit. If you do not use tobacco, do not start.   Lung cancer screening is recommended for adults aged 55-80 years who are at high risk for developing lung cancer because of a history of smoking. A yearly low-dose CT scan of the lungs is recommended for people who have at least a 30-pack-year history of smoking and are a current smoker or have quit within the past 15 years. A pack year of smoking is smoking an average of 1 pack of cigarettes a day for 1 year (for example: 1 pack a day for 30 years or 2 packs a day for 15 years). Yearly screening should continue until the smoker has stopped smoking for at least 15 years. Yearly screening should be stopped for people who develop a   health problem that would prevent them from having lung cancer treatment.   If you are pregnant, do not drink alcohol. If you are breastfeeding, be very cautious about drinking alcohol. If you are not pregnant and choose to drink alcohol, do not have more than 1 drink per day. One drink is considered to be 12 ounces (355 mL) of beer, 5 ounces (148 mL) of wine, or 1.5 ounces (44 mL) of liquor.   Avoid use of street drugs. Do not share needles with anyone. Ask for help if you need support or instructions about stopping the use of  drugs.   High blood pressure causes heart disease and increases the risk of stroke. Your blood pressure should be checked at least yearly.  Ongoing high blood pressure should be treated with medicines if weight loss and exercise do not work.   If you are 69-55 years old, ask your health care provider if you should take aspirin to prevent strokes.   Diabetes screening involves taking a blood sample to check your fasting blood sugar level. This should be done once every 3 years, after age 38, if you are within normal weight and without risk factors for diabetes. Testing should be considered at a younger age or be carried out more frequently if you are overweight and have at least 1 risk factor for diabetes.   Breast cancer screening is essential preventive care for women. You should practice "breast self-awareness."  This means understanding the normal appearance and feel of your breasts and may include breast self-examination.  Any changes detected, no matter how small, should be reported to a health care provider.  Women in their 80s and 30s should have a clinical breast exam (CBE) by a health care provider as part of a regular health exam every 1 to 3 years.  After age 66, women should have a CBE every year.  Starting at age 1, women should consider having a mammogram (breast X-ray test) every year.  Women who have a family history of breast cancer should talk to their health care provider about genetic screening.  Women at a high risk of breast cancer should talk to their health care providers about having an MRI and a mammogram every year.   -Breast cancer gene (BRCA)-related cancer risk assessment is recommended for women who have family members with BRCA-related cancers. BRCA-related cancers include breast, ovarian, tubal, and peritoneal cancers. Having family members with these cancers may be associated with an increased risk for harmful changes (mutations) in the breast cancer genes BRCA1 and  BRCA2. Results of the assessment will determine the need for genetic counseling and BRCA1 and BRCA2 testing.   The Pap test is a screening test for cervical cancer. A Pap test can show cell changes on the cervix that might become cervical cancer if left untreated. A Pap test is a procedure in which cells are obtained and examined from the lower end of the uterus (cervix).   - Women should have a Pap test starting at age 57.   - Between ages 90 and 70, Pap tests should be repeated every 2 years.   - Beginning at age 63, you should have a Pap test every 3 years as long as the past 3 Pap tests have been normal.   - Some women have medical problems that increase the chance of getting cervical cancer. Talk to your health care provider about these problems. It is especially important to talk to your health care provider if a  new problem develops soon after your last Pap test. In these cases, your health care provider may recommend more frequent screening and Pap tests.   - The above recommendations are the same for women who have or have not gotten the vaccine for human papillomavirus (HPV).   - If you had a hysterectomy for a problem that was not cancer or a condition that could lead to cancer, then you no longer need Pap tests. Even if you no longer need a Pap test, a regular exam is a good idea to make sure no other problems are starting.   - If you are between ages 36 and 66 years, and you have had normal Pap tests going back 10 years, you no longer need Pap tests. Even if you no longer need a Pap test, a regular exam is a good idea to make sure no other problems are starting.   - If you have had past treatment for cervical cancer or a condition that could lead to cancer, you need Pap tests and screening for cancer for at least 20 years after your treatment.   - If Pap tests have been discontinued, risk factors (such as a new sexual partner) need to be reassessed to determine if screening should  be resumed.   - The HPV test is an additional test that may be used for cervical cancer screening. The HPV test looks for the virus that can cause the cell changes on the cervix. The cells collected during the Pap test can be tested for HPV. The HPV test could be used to screen women aged 70 years and older, and should be used in women of any age who have unclear Pap test results. After the age of 67, women should have HPV testing at the same frequency as a Pap test.   Colorectal cancer can be detected and often prevented. Most routine colorectal cancer screening begins at the age of 57 years and continues through age 26 years. However, your health care provider may recommend screening at an earlier age if you have risk factors for colon cancer. On a yearly basis, your health care provider may provide home test kits to check for hidden blood in the stool.  Use of a small camera at the end of a tube, to directly examine the colon (sigmoidoscopy or colonoscopy), can detect the earliest forms of colorectal cancer. Talk to your health care provider about this at age 23, when routine screening begins. Direct exam of the colon should be repeated every 5 -10 years through age 49 years, unless early forms of pre-cancerous polyps or small growths are found.   People who are at an increased risk for hepatitis B should be screened for this virus. You are considered at high risk for hepatitis B if:  -You were born in a country where hepatitis B occurs often. Talk with your health care provider about which countries are considered high risk.  - Your parents were born in a high-risk country and you have not received a shot to protect against hepatitis B (hepatitis B vaccine).  - You have HIV or AIDS.  - You use needles to inject street drugs.  - You live with, or have sex with, someone who has Hepatitis B.  - You get hemodialysis treatment.  - You take certain medicines for conditions like cancer, organ  transplantation, and autoimmune conditions.   Hepatitis C blood testing is recommended for all people born from [birth year] through 1965 and any individual  with known risks for hepatitis C.   Practice safe sex. Use condoms and avoid high-risk sexual practices to reduce the spread of sexually transmitted infections (STIs). STIs include gonorrhea, chlamydia, syphilis, trichomonas, herpes, HPV, and human immunodeficiency virus (HIV). Herpes, HIV, and HPV are viral illnesses that have no cure. They can result in disability, cancer, and death. Sexually active women aged 25 years and younger should be checked for chlamydia. Older women with new or multiple partners should also be tested for chlamydia. Testing for other STIs is recommended if you are sexually active and at increased risk.   Osteoporosis is a disease in which the bones lose minerals and strength with aging. This can result in serious bone fractures or breaks. The risk of osteoporosis can be identified using a bone density scan. Women ages 65 years and over and women at risk for fractures or osteoporosis should discuss screening with their health care providers. Ask your health care provider whether you should take a calcium supplement or vitamin D to There are also several preventive steps women can take to avoid osteoporosis and resulting fractures or to keep osteoporosis from worsening. -->Recommendations include:  Eat a balanced diet high in fruits, vegetables, calcium, and vitamins.  Get enough calcium. The recommended total intake of is 1,200 mg daily; for best absorption, if taking supplements, divide doses into 250-500 mg doses throughout the day. Of the two types of calcium, calcium carbonate is best absorbed when taken with food but calcium citrate can be taken on an empty stomach.  Get enough vitamin D. NAMS and the National Osteoporosis Foundation recommend at least 1,000 IU per day for women age 50 and over who are at risk of vitamin D  deficiency. Vitamin D deficiency can be caused by inadequate sun exposure (for example, those who live in northern latitudes).  Avoid alcohol and smoking. Heavy alcohol intake (more than 7 drinks per week) increases the risk of falls and hip fracture and women smokers tend to lose bone more rapidly and have lower bone mass than nonsmokers. Stopping smoking is one of the most important changes women can make to improve their health and decrease risk for disease.  Be physically active every day. Weight-bearing exercise (for example, fast walking, hiking, jogging, and weight training) may strengthen bones or slow the rate of bone loss that comes with aging. Balancing and muscle-strengthening exercises can reduce the risk of falling and fracture.  Consider therapeutic medications. Currently, several types of effective drugs are available. Healthcare providers can recommend the type most appropriate for each woman.  Eliminate environmental factors that may contribute to accidents. Falls cause nearly 90% of all osteoporotic fractures, so reducing this risk is an important bone-health strategy. Measures include ample lighting, removing obstructions to walking, using nonskid rugs on floors, and placing mats and/or grab bars in showers.  Be aware of medication side effects. Some common medicines make bones weaker. These include a type of steroid drug called glucocorticoids used for arthritis and asthma, some antiseizure drugs, certain sleeping pills, treatments for endometriosis, and some cancer drugs. An overactive thyroid gland or using too much thyroid hormone for an underactive thyroid can also be a problem. If you are taking these medicines, talk to your doctor about what you can do to help protect your bones.reduce the rate of osteoporosis.    Menopause can be associated with physical symptoms and risks. Hormone replacement therapy is available to decrease symptoms and risks. You should talk to your  health care provider   about whether hormone replacement therapy is right for you.   Use sunscreen. Apply sunscreen liberally and repeatedly throughout the day. You should seek shade when your shadow is shorter than you. Protect yourself by wearing long sleeves, pants, a wide-brimmed hat, and sunglasses year round, whenever you are outdoors.   Once a month, do a whole body skin exam, using a mirror to look at the skin on your back. Tell your health care provider of new moles, moles that have irregular borders, moles that are larger than a pencil eraser, or moles that have changed in shape or color.   -Stay current with required vaccines (immunizations).   Influenza vaccine. All adults should be immunized every year.  Tetanus, diphtheria, and acellular pertussis (Td, Tdap) vaccine. Pregnant women should receive 1 dose of Tdap vaccine during each pregnancy. The dose should be obtained regardless of the length of time since the last dose. Immunization is preferred during the 27th 36th week of gestation. An adult who has not previously received Tdap or who does not know her vaccine status should receive 1 dose of Tdap. This initial dose should be followed by tetanus and diphtheria toxoids (Td) booster doses every 10 years. Adults with an unknown or incomplete history of completing a 3-dose immunization series with Td-containing vaccines should begin or complete a primary immunization series including a Tdap dose. Adults should receive a Td booster every 10 years.  Varicella vaccine. An adult without evidence of immunity to varicella should receive 2 doses or a second dose if she has previously received 1 dose. Pregnant females who do not have evidence of immunity should receive the first dose after pregnancy. This first dose should be obtained before leaving the health care facility. The second dose should be obtained 4 8 weeks after the first dose.  Human papillomavirus (HPV) vaccine. Females aged 13 26  years who have not received the vaccine previously should obtain the 3-dose series. The vaccine is not recommended for use in pregnant females. However, pregnancy testing is not needed before receiving a dose. If a female is found to be pregnant after receiving a dose, no treatment is needed. In that case, the remaining doses should be delayed until after the pregnancy. Immunization is recommended for any person with an immunocompromised condition through the age of 26 years if she did not get any or all doses earlier. During the 3-dose series, the second dose should be obtained 4 8 weeks after the first dose. The third dose should be obtained 24 weeks after the first dose and 16 weeks after the second dose.  Zoster vaccine. One dose is recommended for adults aged 60 years or older unless certain conditions are present.  Measles, mumps, and rubella (MMR) vaccine. Adults born before 1957 generally are considered immune to measles and mumps. Adults born in 1957 or later should have 1 or more doses of MMR vaccine unless there is a contraindication to the vaccine or there is laboratory evidence of immunity to each of the three diseases. A routine second dose of MMR vaccine should be obtained at least 28 days after the first dose for students attending postsecondary schools, health care workers, or international travelers. People who received inactivated measles vaccine or an unknown type of measles vaccine during 1963 1967 should receive 2 doses of MMR vaccine. People who received inactivated mumps vaccine or an unknown type of mumps vaccine before 1979 and are at high risk for mumps infection should consider immunization with 2 doses of   MMR vaccine. For females of childbearing age, rubella immunity should be determined. If there is no evidence of immunity, females who are not pregnant should be vaccinated. If there is no evidence of immunity, females who are pregnant should delay immunization until after pregnancy.  Unvaccinated health care workers born before [birth year] who lack laboratory evidence of measles, mumps, or rubella immunity or laboratory confirmation of disease should consider measles and mumps immunization with 2 doses of MMR vaccine or rubella immunization with 1 dose of MMR vaccine.  Pneumococcal 13-valent conjugate (PCV13) vaccine. When indicated, a person who is uncertain of her immunization history and has no record of immunization should receive the PCV13 vaccine. An adult aged 54 years or older who has certain medical conditions and has not been previously immunized should receive 1 dose of PCV13 vaccine. This PCV13 should be followed with a dose of pneumococcal polysaccharide (PPSV23) vaccine. The PPSV23 vaccine dose should be obtained at least 8 weeks after the dose of PCV13 vaccine. An adult aged 58 years or older who has certain medical conditions and previously received 1 or more doses of PPSV23 vaccine should receive 1 dose of PCV13. The PCV13 vaccine dose should be obtained 1 or more years after the last PPSV23 vaccine dose.  Pneumococcal polysaccharide (PPSV23) vaccine. When PCV13 is also indicated, PCV13 should be obtained first. All adults aged 58 years and older should be immunized. An adult younger than age 65 years who has certain medical conditions should be immunized. Any person who resides in a nursing home or long-term care facility should be immunized. An adult smoker should be immunized. People with an immunocompromised condition and certain other conditions should receive both PCV13 and PPSV23 vaccines. People with human immunodeficiency virus (HIV) infection should be immunized as soon as possible after diagnosis. Immunization during chemotherapy or radiation therapy should be avoided. Routine use of PPSV23 vaccine is not recommended for American Indians, [place] Natives, or people younger than 65 years unless there are medical conditions that require PPSV23 vaccine. When indicated,  people who have unknown immunization and have no record of immunization should receive PPSV23 vaccine. One-time revaccination 5 years after the first dose of PPSV23 is recommended for people aged 70 64 years who have chronic kidney failure, nephrotic syndrome, asplenia, or immunocompromised conditions. People who received 1 2 doses of PPSV23 before age 32 years should receive another dose of PPSV23 vaccine at age 96 years or later if at least 5 years have passed since the previous dose. Doses of PPSV23 are not needed for people immunized with PPSV23 at or after age 55 years.  Meningococcal vaccine. Adults with asplenia or persistent complement component deficiencies should receive 2 doses of quadrivalent meningococcal conjugate (MenACWY-D) vaccine. The doses should be obtained at least 2 months apart. Microbiologists working with certain meningococcal bacteria, [employer] recruits, people at risk during an outbreak, and people who travel to or live in countries with a high rate of meningitis should be immunized. A first-year college student up through age 58 years who is living in a residence hall should receive a dose if she did not receive a dose on or after her 16th birthday. Adults who have certain high-risk conditions should receive one or more doses of vaccine.  Hepatitis A vaccine. Adults who wish to be protected from this disease, have certain high-risk conditions, work with hepatitis A-infected animals, work in hepatitis A research labs, or travel to or work in countries with a high rate of hepatitis A should be  immunized. Adults who were previously unvaccinated and who anticipate close contact with an international adoptee during the first 60 days after arrival in the [place] from a country with a high rate of hepatitis A should be immunized.  Hepatitis B vaccine.  Adults who wish to be protected from this disease, have certain high-risk conditions, may be exposed to blood or other infectious  body fluids, are household contacts or sex partners of hepatitis B positive people, are clients or workers in certain care facilities, or travel to or work in countries with a high rate of hepatitis B should be immunized.  Haemophilus influenzae type b (Hib) vaccine. A previously unvaccinated person with asplenia or sickle cell disease or having a scheduled splenectomy should receive 1 dose of Hib vaccine. Regardless of previous immunization, a recipient of a hematopoietic stem cell transplant should receive a 3-dose series 6 12 months after her successful transplant. Hib vaccine is not recommended for adults with HIV infection.  Preventive Services / Frequency Ages 6 to 39years  Blood pressure check.** / Every 1 to 2 years.  Lipid and cholesterol check.** / Every 5 years beginning at age 39.  Clinical breast exam.** / Every 3 years for women in their 61s and 62s.  BRCA-related cancer risk assessment.** / For women who have family members with a BRCA-related cancer (breast, ovarian, tubal, or peritoneal cancers).  Pap test.** / Every 2 years from ages 47 through 85. Every 3 years starting at age 34 through age 12 or 74 with a history of 3 consecutive normal Pap tests.  HPV screening.** / Every 3 years from ages 46 through ages 43 to 54 with a history of 3 consecutive normal Pap tests.  Hepatitis C blood test.** / For any individual with known risks for hepatitis C.  Skin self-exam. / Monthly.  Influenza vaccine. / Every year.  Tetanus, diphtheria, and acellular pertussis (Tdap, Td) vaccine.** / Consult your health care provider. Pregnant women should receive 1 dose of Tdap vaccine during each pregnancy. 1 dose of Td every 10 years.  Varicella vaccine.** / Consult your health care provider. Pregnant females who do not have evidence of immunity should receive the first dose after pregnancy.  HPV vaccine. / 3 doses over 6 months, if 64 and younger. The vaccine is not recommended for use in  pregnant females. However, pregnancy testing is not needed before receiving a dose.  Measles, mumps, rubella (MMR) vaccine.** / You need at least 1 dose of MMR if you were born in 1957 or later. You may also need a 2nd dose. For females of childbearing age, rubella immunity should be determined. If there is no evidence of immunity, females who are not pregnant should be vaccinated. If there is no evidence of immunity, females who are pregnant should delay immunization until after pregnancy.  Pneumococcal 13-valent conjugate (PCV13) vaccine.** / Consult your health care provider.  Pneumococcal polysaccharide (PPSV23) vaccine.** / 1 to 2 doses if you smoke cigarettes or if you have certain conditions.  Meningococcal vaccine.** / 1 dose if you are age 71 to 37 years and a Market researcher living in a residence hall, or have one of several medical conditions, you need to get vaccinated against meningococcal disease. You may also need additional booster doses.  Hepatitis A vaccine.** / Consult your health care provider.  Hepatitis B vaccine.** / Consult your health care provider.  Haemophilus influenzae type b (Hib) vaccine.** / Consult your health care provider.  Ages 55 to 64years  Blood pressure check.** / Every 1 to 2 years.  Lipid and cholesterol check.** / Every 5 years beginning at age 20 years.  Lung cancer screening. / Every year if you are aged 55 80 years and have a 30-pack-year history of smoking and currently smoke or have quit within the past 15 years. Yearly screening is stopped once you have quit smoking for at least 15 years or develop a health problem that would prevent you from having lung cancer treatment.  Clinical breast exam.** / Every year after age 40 years.  BRCA-related cancer risk assessment.** / For women who have family members with a BRCA-related cancer (breast, ovarian, tubal, or peritoneal cancers).  Mammogram.** / Every year beginning at age 40  years and continuing for as long as you are in good health. Consult with your health care provider.  Pap test.** / Every 3 years starting at age 30 years through age 65 or 70 years with a history of 3 consecutive normal Pap tests.  HPV screening.** / Every 3 years from ages 30 years through ages 65 to 70 years with a history of 3 consecutive normal Pap tests.  Fecal occult blood test (FOBT) of stool. / Every year beginning at age 50 years and continuing until age 75 years. You may not need to do this test if you get a colonoscopy every 10 years.  Flexible sigmoidoscopy or colonoscopy.** / Every 5 years for a flexible sigmoidoscopy or every 10 years for a colonoscopy beginning at age 50 years and continuing until age 75 years.  Hepatitis C blood test.** / For all people born from 1945 through 1965 and any individual with known risks for hepatitis C.  Skin self-exam. / Monthly.  Influenza vaccine. / Every year.  Tetanus, diphtheria, and acellular pertussis (Tdap/Td) vaccine.** / Consult your health care provider. Pregnant women should receive 1 dose of Tdap vaccine during each pregnancy. 1 dose of Td every 10 years.  Varicella vaccine.** / Consult your health care provider. Pregnant females who do not have evidence of immunity should receive the first dose after pregnancy.  Zoster vaccine.** / 1 dose for adults aged 60 years or older.  Measles, mumps, rubella (MMR) vaccine.** / You need at least 1 dose of MMR if you were born in 1957 or later. You may also need a 2nd dose. For females of childbearing age, rubella immunity should be determined. If there is no evidence of immunity, females who are not pregnant should be vaccinated. If there is no evidence of immunity, females who are pregnant should delay immunization until after pregnancy.  Pneumococcal 13-valent conjugate (PCV13) vaccine.** / Consult your health care provider.  Pneumococcal polysaccharide (PPSV23) vaccine.** / 1 to 2 doses if  you smoke cigarettes or if you have certain conditions.  Meningococcal vaccine.** / Consult your health care provider.  Hepatitis A vaccine.** / Consult your health care provider.  Hepatitis B vaccine.** / Consult your health care provider.  Haemophilus influenzae type b (Hib) vaccine.** / Consult your health care provider.  Ages 65 years and over  Blood pressure check.** / Every 1 to 2 years.  Lipid and cholesterol check.** / Every 5 years beginning at age 20 years.  Lung cancer screening. / Every year if you are aged 55 80 years and have a 30-pack-year history of smoking and currently smoke or have quit within the past 15 years. Yearly screening is stopped once you have quit smoking for at least 15 years or develop a health problem that   would prevent you from having lung cancer treatment.  Clinical breast exam.** / Every year after age 103 years.  BRCA-related cancer risk assessment.** / For women who have family members with a BRCA-related cancer (breast, ovarian, tubal, or peritoneal cancers).  Mammogram.** / Every year beginning at age 36 years and continuing for as long as you are in good health. Consult with your health care provider.  Pap test.** / Every 3 years starting at age 5 years through age 85 or 10 years with 3 consecutive normal Pap tests. Testing can be stopped between 65 and 70 years with 3 consecutive normal Pap tests and no abnormal Pap or HPV tests in the past 10 years.  HPV screening.** / Every 3 years from ages 93 years through ages 70 or 45 years with a history of 3 consecutive normal Pap tests. Testing can be stopped between 65 and 70 years with 3 consecutive normal Pap tests and no abnormal Pap or HPV tests in the past 10 years.  Fecal occult blood test (FOBT) of stool. / Every year beginning at age 8 years and continuing until age 45 years. You may not need to do this test if you get a colonoscopy every 10 years.  Flexible sigmoidoscopy or colonoscopy.** /  Every 5 years for a flexible sigmoidoscopy or every 10 years for a colonoscopy beginning at age 69 years and continuing until age 68 years.  Hepatitis C blood test.** / For all people born from [birth year] through 1965 and any individual with known risks for hepatitis C.  Osteoporosis screening.** / A one-time screening for women ages 7 years and over and women at risk for fractures or osteoporosis.  Skin self-exam. / Monthly.  Influenza vaccine. / Every year.  Tetanus, diphtheria, and acellular pertussis (Tdap/Td) vaccine.** / 1 dose of Td every 10 years.  Varicella vaccine.** / Consult your health care provider.  Zoster vaccine.** / 1 dose for adults aged 5 years or older.  Pneumococcal 13-valent conjugate (PCV13) vaccine.** / Consult your health care provider.  Pneumococcal polysaccharide (PPSV23) vaccine.** / 1 dose for all adults aged 74 years and older.  Meningococcal vaccine.** / Consult your health care provider.  Hepatitis A vaccine.** / Consult your health care provider.  Hepatitis B vaccine.** / Consult your health care provider.  Haemophilus influenzae type b (Hib) vaccine.** / Consult your health care provider. ** Family history and personal history of risk and conditions may change your health care provider's recommendations. Document Released: 05/04/2001 Document Revised: 12/27/2012  [facility] Patient Information 2014 [place], [place].   EXERCISE AND DIET:  We recommended that you start or continue a regular exercise program for good health. Regular exercise means any activity that makes your heart beat faster and makes you sweat.  We recommend exercising at least 30 minutes per day at least 3 days a week, preferably 5.  We also recommend a diet low in fat and sugar / carbohydrates.  Inactivity, poor dietary choices and obesity can cause diabetes, heart attack, stroke, and kidney damage, among others.     ALCOHOL AND SMOKING:  Women should limit their alcohol intake to no  more than 7 drinks/beers/glasses of wine (combined, not each!) per week. Moderation of alcohol intake to this level decreases your risk of breast cancer and liver damage.  ( And of course, no recreational drugs are part of a healthy lifestyle.)  Also, you should not be smoking at all or even being exposed to second hand smoke. Most people know smoking can  cause cancer, and various heart and lung diseases, but did you know it also contributes to weakening of your bones?  Aging of your skin?  Yellowing of your teeth and nails?   CALCIUM AND VITAMIN D:  Adequate intake of calcium and Vitamin D are recommended.  The recommendations for exact amounts of these supplements seem to change often, but generally speaking 600 mg of calcium (either carbonate or citrate) and 800 units of Vitamin D per day seems prudent. Certain women may benefit from higher intake of Vitamin D.  If you are among these women, your doctor will have told you during your visit.     PAP SMEARS:  Pap smears, to check for cervical cancer or precancers,  have traditionally been done yearly, although recent scientific advances have shown that most women can have pap smears less often.  However, every woman still should have a physical exam from her gynecologist or primary care physician every year. It will include a breast check, inspection of the vulva and vagina to check for abnormal growths or skin changes, a visual exam of the cervix, and then an exam to evaluate the size and shape of the uterus and ovaries.  And after 73 years of age, a rectal exam is indicated to check for rectal cancers. We will also provide age appropriate advice regarding health maintenance, like when you should have certain vaccines, screening for sexually transmitted diseases, bone density testing, colonoscopy, mammograms, etc.    MAMMOGRAMS:  All women over 10 years old should have a yearly mammogram. Many facilities now offer a "3D" mammogram, which may cost  around $50 extra out of pocket. If possible,  we recommend you accept the option to have the 3D mammogram performed.  It both reduces the number of women who will be called back for extra views which then turn out to be normal, and it is better than the routine mammogram at detecting truly abnormal areas.     COLONOSCOPY:  Colonoscopy to screen for colon cancer is recommended for all women at age 41.  We know, you hate the idea of the prep.  We agree, BUT, having colon cancer and not knowing it is worse!!  Colon cancer so often starts as a polyp that can be seen and removed at colonscopy, which can quite literally save your life!  And if your first colonoscopy is normal and you have no family history of colon cancer, most women don't have to have it again for 10 years.  Once every ten years, you can do something that may end up saving your life, right?  We will be happy to help you get it scheduled when you are ready.  Be sure to check your insurance coverage so you understand how much it will cost.  It may be covered as a preventative service at no cost, but you should check your particular policy.    DASH Eating Plan DASH stands for "Dietary Approaches to Stop Hypertension." The DASH eating plan is a healthy eating plan that has been shown to reduce high blood pressure (hypertension). It may also reduce your risk for type 2 diabetes, heart disease, and stroke. The DASH eating plan may also help with weight loss. What are tips for following this plan?  General guidelines  Avoid eating more than 2,300 mg (milligrams) of salt (sodium) a day. If you have hypertension, you may need to reduce your sodium intake to 1,500 mg a day.  Limit alcohol intake to no more  than 1 drink a day for nonpregnant women and 2 drinks a day for men. One drink equals 12 oz of beer, 5 oz of wine, or 1 oz of hard liquor.  Work with your health care provider to maintain a healthy body weight or to lose weight. Ask what an  ideal weight is for you.  Get at least 30 minutes of exercise that causes your heart to beat faster (aerobic exercise) most days of the week. Activities may include walking, swimming, or biking.  Work with your health care provider or diet and nutrition specialist (dietitian) to adjust your eating plan to your individual calorie needs. Reading food labels   Check food labels for the amount of sodium per serving. Choose foods with less than 5 percent of the Daily Value of sodium. Generally, foods with less than 300 mg of sodium per serving fit into this eating plan.  To find whole grains, look for the word "whole" as the first word in the ingredient list. Shopping  Buy products labeled as "low-sodium" or "no salt added."  Buy fresh foods. Avoid canned foods and premade or frozen meals. Cooking  Avoid adding salt when cooking. Use salt-free seasonings or herbs instead of table salt or sea salt. Check with your health care provider or pharmacist before using salt substitutes.  Do not fry foods. Cook foods using healthy methods such as baking, boiling, grilling, and broiling instead.  Cook with heart-healthy oils, such as olive, canola, soybean, or sunflower oil. Meal planning  Eat a balanced diet that includes: ? 5 or more servings of fruits and vegetables each day. At each meal, try to fill half of your plate with fruits and vegetables. ? Up to 6-8 servings of whole grains each day. ? Less than 6 oz of lean meat, poultry, or fish each day. A 3-oz serving of meat is about the same size as a deck of cards. One egg equals 1 oz. ? 2 servings of low-fat dairy each day. ? A serving of nuts, seeds, or beans 5 times each week. ? Heart-healthy fats. Healthy fats called Omega-3 fatty acids are found in foods such as flaxseeds and coldwater fish, like sardines, salmon, and mackerel.  Limit how much you eat of the following: ? Canned or prepackaged foods. ? Food that is high in trans fat, such  as fried foods. ? Food that is high in saturated fat, such as fatty meat. ? Sweets, desserts, sugary drinks, and other foods with added sugar. ? Full-fat dairy products.  Do not salt foods before eating.  Try to eat at least 2 vegetarian meals each week.  Eat more home-cooked food and less restaurant, buffet, and fast food.  When eating at a restaurant, ask that your food be prepared with less salt or no salt, if possible. What foods are recommended? The items listed may not be a complete list. Talk with your dietitian about what dietary choices are best for you. Grains Whole-grain or whole-wheat bread. Whole-grain or whole-wheat pasta. Brown rice. [place]. Bulgur. Whole-grain and low-sodium cereals. Pita bread. Low-fat, low-sodium crackers. Whole-wheat flour tortillas. Vegetables Fresh or frozen vegetables (raw, steamed, roasted, or grilled). Low-sodium or reduced-sodium tomato and vegetable juice. Low-sodium or reduced-sodium tomato sauce and tomato paste. Low-sodium or reduced-sodium canned vegetables. Fruits All fresh, dried, or frozen fruit. Canned fruit in natural juice (without added sugar). Meat and other protein foods Skinless chicken or [place]. Ground chicken or [place]. Pork with fat trimmed off. Fish and seafood. Egg  whites. Dried beans, peas, or lentils. Unsalted nuts, nut butters, and seeds. Unsalted canned beans. Lean cuts of beef with fat trimmed off. Low-sodium, lean deli meat. Dairy Low-fat (1%) or fat-free (skim) milk. Fat-free, low-fat, or reduced-fat cheeses. Nonfat, low-sodium ricotta or cottage cheese. Low-fat or nonfat yogurt. Low-fat, low-sodium cheese. Fats and oils Soft margarine without trans fats. Vegetable oil. Low-fat, reduced-fat, or light mayonnaise and salad dressings (reduced-sodium). Canola, safflower, olive, soybean, and sunflower oils. Avocado. Seasoning and other foods Herbs. Spices. Seasoning mixes without salt. Unsalted popcorn and pretzels.  Fat-free sweets. What foods are not recommended? The items listed may not be a complete list. Talk with your dietitian about what dietary choices are best for you. Grains Baked goods made with fat, such as croissants, muffins, or some breads. Dry pasta or rice meal packs. Vegetables Creamed or fried vegetables. Vegetables in a cheese sauce. Regular canned vegetables (not low-sodium or reduced-sodium). Regular canned tomato sauce and paste (not low-sodium or reduced-sodium). Regular tomato and vegetable juice (not low-sodium or reduced-sodium). Angie Fava. Olives. Fruits Canned fruit in a light or heavy syrup. Fried fruit. Fruit in cream or butter sauce. Meat and other protein foods Fatty cuts of meat. Ribs. Fried meat. Berniece Salines. Sausage. Bologna and other processed lunch meats. Salami. Fatback. Hotdogs. Bratwurst. Salted nuts and seeds. Canned beans with added salt. Canned or smoked fish. Whole eggs or egg yolks. Chicken or [place] with skin. Dairy Whole or 2% milk, cream, and half-and-half. Whole or full-fat cream cheese. Whole-fat or sweetened yogurt. Full-fat cheese. Nondairy creamers. Whipped toppings. Processed cheese and cheese spreads. Fats and oils Butter. Stick margarine. Lard. Shortening. Ghee. Bacon fat. Tropical oils, such as coconut, palm kernel, or palm oil. Seasoning and other foods Salted popcorn and pretzels. Onion salt, garlic salt, seasoned salt, table salt, and sea salt. Worcestershire sauce. Tartar sauce. Barbecue sauce. Teriyaki sauce. Soy sauce, including reduced-sodium. Steak sauce. Canned and packaged gravies. Fish sauce. Oyster sauce. Cocktail sauce. Horseradish that you find on the shelf. Ketchup. Mustard. Meat flavorings and tenderizers. Bouillon cubes. Hot sauce and Tabasco sauce. Premade or packaged marinades. Premade or packaged taco seasonings. Relishes. Regular salad dressings. Where to find more information:  National Heart, Lung, and [place]:  https://wilson-eaton.com/  American Heart Association: www.heart.org Summary  The DASH eating plan is a healthy eating plan that has been shown to reduce high blood pressure (hypertension). It may also reduce your risk for type 2 diabetes, heart disease, and stroke.  With the DASH eating plan, you should limit salt (sodium) intake to 2,300 mg a day. If you have hypertension, you may need to reduce your sodium intake to 1,500 mg a day.  When on the DASH eating plan, aim to eat more fresh fruits and vegetables, whole grains, lean proteins, low-fat dairy, and heart-healthy fats.  Work with your health care provider or diet and nutrition specialist (dietitian) to adjust your eating plan to your individual calorie needs. This information is not intended to replace advice given to you by your health care provider. Make sure you discuss any questions you have with your health care provider. Document Released: 02/25/2011 Document Revised: 03/01/2016 Document Reviewed: 03/01/2016 Elsevier Interactive Patient Education  2019 [place].   A1c- 6.2, which is considered pre-diabetic, please work on reducing sugar and carbohydrate intake. Recommend re-checking in 3 months, Avoid Aspirin and NSAIDs for pain management due to your kidney disease. Follow DASH diet. Increase regular exercise.  Recommend at least 30 minutes daily, 5 days per week of  walking, jogging, biking, swimming, YouTube/Pinterest workout videos.  Continue to monitor and record home BP. Focus on weight loss. Keep follow-up with Nephrology 10/16/2018 Please call your Rheumatologist and schedule a follow-up. Follow-up with your Gastroenterologist as needed. Follow-up here in 63 months, please come fasting. Continue to social distance and wear a mask when in public.

## 2018-08-29 NOTE — Assessment & Plan Note (Signed)
BP at goal 121/74, HR 71 Continue Amlodipine 5mg  QD Followed by Nephrology, next OV 10/16/2018

## 2018-08-29 NOTE — Assessment & Plan Note (Signed)
Lab Results  Component Value Date   HGBA1C 6.2 (A) 08/29/2018   HGBA1C 5.9 (H) 06/22/2017  Encouraged to reduce CHO/sugar and increase regular exercise

## 2018-08-29 NOTE — Assessment & Plan Note (Addendum)
A1c- 6.2, which is considered pre-diabetic, please work on reducing sugar and carbohydrate intake. Recommend re-checking in 3 months, Avoid Aspirin and NSAIDs for pain management due to your kidney disease. Follow DASH diet. Increase regular exercise.  Recommend at least 30 minutes daily, 5 days per week of walking, jogging, biking, swimming, YouTube/Pinterest workout videos.  Continue to monitor and record home BP. Focus on weight loss. Keep follow-up with Nephrology 10/16/2018 Please call your Rheumatologist and schedule a follow-up. Follow-up with your Gastroenterologist as needed. Follow-up here in 63 months, please come fasting. Continue to social distance and wear a mask when in public.

## 2018-08-29 NOTE — Addendum Note (Signed)
Addended by: Fonnie Mu on: 08/29/2018 09:47 AM   Modules accepted: Orders

## 2018-09-11 ENCOUNTER — Telehealth: Payer: Self-pay

## 2018-09-11 ENCOUNTER — Encounter: Payer: Self-pay | Admitting: Adult Health

## 2018-09-11 NOTE — Telephone Encounter (Signed)
Covid-19 screening questions   Do you now or have you had a fever in the last 14 days? No  Do you have any respiratory symptoms of shortness of breath or cough now or in the last 14 days? No  Do you have any family members or close contacts with diagnosed or suspected Covid-19 in the past 14 days? No  Have you been tested for Covid-19 and found to be positive? No        

## 2018-09-12 ENCOUNTER — Encounter: Payer: Self-pay | Admitting: Adult Health

## 2018-09-12 ENCOUNTER — Encounter: Payer: Self-pay | Admitting: Gastroenterology

## 2018-09-12 ENCOUNTER — Ambulatory Visit (INDEPENDENT_AMBULATORY_CARE_PROVIDER_SITE_OTHER): Payer: Federal, State, Local not specified - PPO | Admitting: Gastroenterology

## 2018-09-12 VITALS — BP 126/72 | HR 76 | Temp 97.8°F | Ht 65.0 in | Wt 224.0 lb

## 2018-09-12 DIAGNOSIS — K580 Irritable bowel syndrome with diarrhea: Secondary | ICD-10-CM

## 2018-09-12 DIAGNOSIS — Z86010 Personal history of colonic polyps: Secondary | ICD-10-CM

## 2018-09-12 NOTE — Patient Instructions (Signed)
If you are age 73 or older, your body mass index should be between 23-30. Your Body mass index is 37.28 kg/m. If this is out of the aforementioned range listed, please consider follow up with your Primary Care Provider.  If you are age 59 or younger, your body mass index should be between 19-25. Your Body mass index is 37.28 kg/m. If this is out of the aformentioned range listed, please consider follow up with your Primary Care Provider.   It was a pleasure to see you today!  Dr. Loletha Carrow

## 2018-09-12 NOTE — Progress Notes (Signed)
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  [place] referenced in 09/2017 clinic note) 2016 with report of polyps (no details) and unknown prep quality. No mention of Bx to r/o micro colitis.  She has been doing fairly well overall on once daily Robinul.  With that she typically has 1 or 2 BMs per day, formed with no rectal bleeding.  She still has occasional right upper quadrant pain that has occurred for years.  She wonders if it may be due to her statin medicine.  She also has chronic musculoskeletal pain from lupus, putting some recent right shoulder pain.  ROS: Cardiovascular:  no chest pain Respiratory: no dyspnea Depression under control on Prozac.  The patient's Past Medical, Family and Social History were reviewed and are on file in the EMR.  Objective:  Med list reviewed  Current Outpatient Medications:  .  albuterol (PROVENTIL HFA;VENTOLIN HFA) 108 (90 Base) MCG/ACT inhaler, Inhale 1 puff into the lungs every 6 (six) hours as needed for wheezing or shortness of breath., Disp: , Rfl:  .  amLODipine (NORVASC) 5 MG tablet, Take 1 tablet (5 mg total) by mouth daily. PATIENT NEEDS OFFICE VISIT AND LABS PRIOR TO ANY FURTHER REFILLS, Disp: 90 tablet, Rfl: 0 .  aspirin 325 MG tablet, Take 325 mg by mouth every 4 (four) hours as needed., Disp: , Rfl:  .  atorvastatin (LIPITOR) 20 MG tablet, TAKE 1 TABLET BY MOUTH EVERY DAY, Disp: 90 tablet, Rfl: 1 .  FLUoxetine (PROZAC) 40 MG capsule, Take 1 capsule (40 mg total) by mouth daily., Disp: 90 capsule, Rfl: 3 .  glycopyrrolate (ROBINUL) 2 MG tablet, Take 1 tablet (2 mg total) by mouth 2 (two) times daily., Disp: 90 tablet, Rfl: 0 .  hydroxychloroquine (PLAQUENIL) 200 MG tablet, Take 200 mg by mouth 2 (two) times daily., Disp: , Rfl:  .  Multiple Vitamins-Minerals (HAIR SKIN AND NAILS FORMULA PO), Take 1 tablet by mouth daily.,  Disp: , Rfl:  .  Turmeric 500 MG CAPS, Take 1,000 mg by mouth daily., Disp: , Rfl:    Vital signs in last 24 hrs: Vitals:   09/12/18 1000  BP: 126/72  Pulse: 76  Temp: 97.8 F (36.6 C)    Physical Exam  She is well-appearing, pleasant and conversational  HEENT: sclera anicteric, oral mucosa moist without lesions  Neck: supple, no thyromegaly, JVD or lymphadenopathy  Cardiac: RRR without murmurs, S1S2 heard, no peripheral edema  Pulm: clear to auscultation bilaterally, normal RR and effort noted  Abdomen: soft, no tenderness, with active bowel sounds. No guarding or palpable hepatosplenomegaly.  Skin; warm and dry, no jaundice or rash   @ASSESSMENTPLANBEGIN @ Assessment: Encounter Diagnoses  Name Primary?  . Irritable bowel syndrome with diarrhea Yes  . Personal history of colonic polyps     IBS stable on Robinul, which she says is the most successful medicine she has yet tried for this.  Unclear what other medicines were used, somewhat limited records when she came to see us.  She seems to be tolerating the medicine without side effects, so I will continue it.  It is difficult to know the best surveillance colonoscopy interval with limited records from [place] colon[place].  I explained to her that to be cautious, I feel she should have a recall colonoscopy in early to mid 2021, which would be a 5-year interval from  the last.  Plan: We will place a colonoscopy recall for her.  Total time 20 minutes, over half spent face-to-face with patient in counseling and coordination of care.   Nelida Meuse III

## 2018-09-13 ENCOUNTER — Telehealth: Payer: Self-pay | Admitting: Adult Health

## 2018-09-13 NOTE — Telephone Encounter (Signed)
Pt scheduled for lab draw.  Dawn Cox, CMA

## 2018-09-13 NOTE — Telephone Encounter (Signed)
Patient called to request Blood type-- did NOT specify if she wanted blood drawn for type or if we already know if and can share what her blood type Is?  --glh

## 2018-09-14 ENCOUNTER — Other Ambulatory Visit: Payer: Self-pay

## 2018-09-14 ENCOUNTER — Other Ambulatory Visit: Payer: Federal, State, Local not specified - PPO

## 2018-09-14 DIAGNOSIS — Z0183 Encounter for blood typing: Secondary | ICD-10-CM

## 2018-09-15 ENCOUNTER — Encounter: Payer: Self-pay | Admitting: Adult Health

## 2018-09-15 LAB — ABO AND RH: Rh Factor: POSITIVE

## 2018-10-04 ENCOUNTER — Telehealth: Payer: Self-pay

## 2018-10-04 MED ORDER — GLYCOPYRROLATE 2 MG PO TABS: 2.0000 mg | ORAL_TABLET | Freq: Two times a day (BID) | ORAL | 1 refills | Status: DC

## 2018-10-04 NOTE — Telephone Encounter (Signed)
I have sent the renewal, thanks.  Please let her know.

## 2018-10-04 NOTE — Telephone Encounter (Signed)
Pt notified and aware. ?

## 2018-10-04 NOTE — Telephone Encounter (Signed)
Incoming fax request for glycopyrrolate 2 mg. Take 1 tab bid

## 2018-10-23 ENCOUNTER — Other Ambulatory Visit: Payer: Self-pay | Admitting: Adult Health

## 2018-10-26 ENCOUNTER — Other Ambulatory Visit: Payer: Self-pay | Admitting: Adult Health

## 2018-11-28 NOTE — Progress Notes (Signed)
Subjective:    Patient ID: Dawn Cox, female    DOB: 03/31/1945, 73 y.o.   MRN: [id number]  HPI:08/29/2018 OV: Dawn Cox presents for CPE PMH:  HTN, HLD, Depression, SLE- followed by Rhuematology She reports medication compliance, denies SE She has gained 10 lbs since last OV in Jan 2020 Current wt 226 Since COVID-19 pandemic started, she has not been walking or performing in any form of exercise. She reports increase in snacking.  Last seen by Nephrology- 04/17/2018: No change to medication regime, increase water intake, ambulatory BP monitoring, Low salt diet, increase exercise, encouraged wt loss F/u 10/16/2018  04/03/2018 Labs: CMP- stable  re: CKD TSH-WNL, 3.120 The 10-year ASCVD risk score Dawn Bussing DC Jr., et al., 2013) is: 12.7% Values used to calculate the score:  Age: 67 years  Sex: Female  Is Non-Hispanic African American: Yes  Diabetic: No  Tobacco smoker: No  Systolic Blood Pressure: 546 mmHg  Is BP treated: Yes  HDL Cholesterol: 49 mg/dL  Total Cholesterol: 204 mg/dL LDL-129 Encouraged to reduce saturated fat and increase regular exercise   11/29/2018 OV:   [name] is here for 3 month f/u: [place], HLD, Depression, SLE- followed by Rhuematology Followed by Dawn Cox OV 09/12/2018-IBS-D, treated with Robinul Colonoscopy ordered Followed by Nephrology for CKD- last OV 7/8//2020- no change to current medication regime Benign hypertension with CKD (chronic kidney disease) stage III ([place])  1) Continue present regimen. 2) Low salt diet, exercise and weight loss. 3) Increase water intake. 4) Monitor and record home BP. 5) Send urine for culture. 6) Repeat labs before next visit.  She reports ambulatory BP SBP 110-120s DBP 70s  Lab Results  Component Value Date   HGBA1C 6.3 (A) 11/29/2018   HGBA1C 6.2 (A) 08/29/2018   HGBA1C 5.9 (H) 06/22/2017   Really stressed the importance of diet, weight reduction, increasing regular  exercise  She estimates to 24 oz x 3/day, no water restrictions per Nephrology  Will send CMP results to Nephrology   Patient Care Team    Relationship Specialty Notifications Start End  Dawn Grandchild, NP PCP - General Family Medicine  05/25/17   Dawn Stakes, MD Referring Physician Gastroenterology  05/31/17    Comment: Office address [address] [place], [place] [address]: phone [phone]    Patient Active Problem List   Diagnosis Date Noted  . Chronic kidney disease (CKD), stage III (moderate) ([place]) 11/29/2018  . Pre-diabetes 11/29/2018  . Blood glucose elevated 08/29/2018  . Abnormal urinalysis 04/03/2018  . Depression 12/22/2017  . Perioral dermatitis 06/22/2017  . Diverticulitis 05/25/2017  . Hx of systemic lupus erythematosus (SLE) ([place]) 05/25/2017  . Other fatigue 05/25/2017  . Hypertension 05/25/2017  . Hyperlipidemia 05/25/2017  . Healthcare maintenance 05/25/2017  . Screening for breast cancer 05/25/2017  . Body aches 05/25/2017     Past Medical History:  Diagnosis Date  . Ankle fracture    right  . Anxiety   . Depression   . Diverticulitis   . Hyperlipidemia   . Hypertension   . Lupus ([place])   . Retinal detachment   . Retinal hemorrhage      Past Surgical History:  Procedure Laterality Date  . ABDOMINAL HYSTERECTOMY    . BREAST BIOPSY    . BREAST SURGERY Right    cyst removal  . CATARACT EXTRACTION Bilateral   . EYE SURGERY Right    hemorrhagia of retina  . HERNIA REPAIR    . RETINAL DETACHMENT SURGERY  Family History  Problem Relation Age of Onset  . Heart attack Mother   . Liver cancer Father   . Depression Daughter        suicide  . Breast cancer Cousin   . Colon cancer Neg Hx   . Stomach cancer Neg Hx   . Esophageal cancer Neg Hx   . Rectal cancer Neg Hx      Social History   Substance and Sexual Activity  Drug Use No     Social History   Substance and Sexual Activity  Alcohol Use Yes  . Frequency: Never    Comment: rarely     Social History   Tobacco Use  Smoking Status Never Smoker  Smokeless Tobacco Never Used     Outpatient Encounter Medications as of 11/29/2018  Medication Sig  . albuterol (PROVENTIL HFA;VENTOLIN HFA) 108 (90 Base) MCG/ACT inhaler Inhale 1 puff into the lungs every 6 (six) hours as needed for wheezing or shortness of breath.  Marland Kitchen. amLODipine (NORVASC) 5 MG tablet TAKE 1 TABLET(5 MG) BY MOUTH DAILY  . aspirin 325 MG tablet Take 325 mg by mouth every 4 (four) hours as needed.  Marland Kitchen. atorvastatin (LIPITOR) 20 MG tablet TAKE 1 TABLET BY MOUTH EVERY DAY  . FLUoxetine (PROZAC) 40 MG capsule Take 1 capsule (40 mg total) by mouth daily.  Marland Kitchen. glycopyrrolate (ROBINUL) 2 MG tablet Take 1 tablet (2 mg total) by mouth 2 (two) times daily.  . hydroxychloroquine (PLAQUENIL) 200 MG tablet Take 200 mg by mouth 2 (two) times daily.  . Multiple Vitamins-Minerals (HAIR SKIN AND NAILS FORMULA PO) Take 1 tablet by mouth daily.  . Turmeric 500 MG CAPS Take 1,000 mg by mouth daily.   No facility-administered encounter medications on file as of 11/29/2018.     Allergies: Aloe, Latex, Other, Shellfish allergy, and Vibramycin [doxycycline calcium]  Body mass index is 37.11 kg/m.  Blood pressure 119/72, pulse 73, temperature 98.4 F (36.9 C), temperature source Oral, height 5\' 5"  (1.651 m), weight 223 lb (101.2 kg), SpO2 98 %.   Review of Systems  Constitutional: Positive for fatigue. Negative for activity change, appetite change, chills, diaphoresis, fever and unexpected weight change.  HENT: Negative for congestion.   Eyes: Negative for visual disturbance.  Respiratory: Negative for cough, chest tightness, shortness of breath, wheezing and stridor.   Cardiovascular: Negative for chest pain, palpitations and leg swelling.  Gastrointestinal: Positive for diarrhea. Negative for abdominal distention, abdominal pain, blood in stool, constipation, nausea and rectal pain.  Genitourinary: Negative  for difficulty urinating and flank pain.  Musculoskeletal: Positive for arthralgias, joint swelling, myalgias, neck pain and neck stiffness. Negative for back pain and gait problem.  Skin: Negative for color change, pallor, rash and wound.  Neurological: Negative for dizziness, light-headedness and headaches.  Hematological: Negative for adenopathy. Does not bruise/bleed easily.  Psychiatric/Behavioral: Negative for agitation, behavioral problems, confusion, decreased concentration, dysphoric mood, hallucinations, self-injury, sleep disturbance and suicidal ideas. The patient is not nervous/anxious and is not hyperactive.        Objective:   Physical Exam Vitals signs and nursing note reviewed.  Constitutional:      General: She is not in acute distress.    Appearance: She is obese. She is not ill-appearing, toxic-appearing or diaphoretic.  Eyes:     Extraocular Movements: Extraocular movements intact.     Conjunctiva/sclera: Conjunctivae normal.     Pupils: Pupils are equal, round, and reactive to light.  Cardiovascular:     Rate and  Rhythm: Normal rate and regular rhythm.     Pulses: Normal pulses.     Heart sounds: Normal heart sounds. No murmur. No friction rub. No gallop.   Pulmonary:     Effort: Pulmonary effort is normal. No respiratory distress.     Breath sounds: Normal breath sounds. No stridor. No wheezing, rhonchi or rales.  Chest:     Chest wall: No tenderness.  Musculoskeletal:     Right lower leg: No edema.     Left lower leg: No edema.  Skin:    General: Skin is warm and dry.     Capillary Refill: Capillary refill takes less than 2 seconds.  Neurological:     Mental Status: She is alert.  Psychiatric:        Mood and Affect: Mood normal.        Behavior: Behavior normal.        Thought Content: Thought content normal.        Judgment: Judgment normal.        Assessment & Plan:   1. Blood glucose elevated   2. Hypertension, unspecified type   3.  Hyperlipidemia, unspecified hyperlipidemia type   4. Healthcare maintenance   5. Need for influenza vaccination   6. Chronic kidney disease (CKD), stage III (moderate) ([facility])   7. Depression, unspecified depression type   8. Pre-diabetes     Healthcare maintenance Blood pressure is excellent- continue to check at home. A1c (average of your blood sugar over last 3 months) is 6.3, up from 6.2 in Jan. This is considered pre-diabetes- recommend that you reduce sugar/carbohyrdate intake and increase daily walking- twice daily. We will call you when lab results are available. Continue all medications as directed. Remain well hydrated with water, follow heart healthy diet. Continue to social distance and wear a mask when in public. Follow-up 3 months.  Hypertension BP at goa- 119/72, HR 73 Ambulatory Readings SBP 110-120s DBP 70s HR 70-80s She has been on Amlodipine for years Currently 5 mg QD  Chronic kidney disease (CKD), stage III (moderate) ([facility]) Followed by Nephrology for CKD- last OV 7/8//2020- no change to current medication regime Benign hypertension with CKD (chronic kidney disease) stage III ([facility])  1) Continue present regimen. 2) Low salt diet, exercise and weight loss. 3) Increase water intake. 4) Monitor and record home BP. 5) Send urine for culture. 6) Repeat labs before next visit.  She reports ambulatory BP SBP 110-120s DBP 70s  Currently on Amlodipine 5mg  QD  CMP drawn today- will send over to Nephrology   Hyperlipidemia Lipid Panel drawn Currently on Atorvastatin 20mg  QD   Depression Mood stable, denies Dawn/HI Currently on Fluoxetine 40mg  QD   Pre-diabetes Lab Results  Component Value Date   HGBA1C 6.3 (A) 11/29/2018   HGBA1C 6.2 (A) 08/29/2018   HGBA1C 5.9 (H) 06/22/2017  Stressed lifestyle changes F/u 3 months- re-check A1c    FOLLOW-UP:  Return in about 3 months (around 02/28/2019) for Regular Follow Up, HTN, Obesity, A1c re-check.

## 2018-11-29 ENCOUNTER — Ambulatory Visit: Payer: Federal, State, Local not specified - PPO | Admitting: Adult Health

## 2018-11-29 ENCOUNTER — Encounter: Payer: Self-pay | Admitting: Adult Health

## 2018-11-29 ENCOUNTER — Other Ambulatory Visit: Payer: Self-pay

## 2018-11-29 VITALS — BP 119/72 | HR 73 | Temp 98.4°F | Ht 65.0 in | Wt 223.0 lb

## 2018-11-29 DIAGNOSIS — Z0000 Encounter for general adult medical examination without abnormal findings: Secondary | ICD-10-CM | POA: Diagnosis not present

## 2018-11-29 DIAGNOSIS — I10 Essential (primary) hypertension: Secondary | ICD-10-CM

## 2018-11-29 DIAGNOSIS — R739 Hyperglycemia, unspecified: Secondary | ICD-10-CM | POA: Diagnosis not present

## 2018-11-29 DIAGNOSIS — Z23 Encounter for immunization: Secondary | ICD-10-CM

## 2018-11-29 DIAGNOSIS — E1122 Type 2 diabetes mellitus with diabetic chronic kidney disease: Secondary | ICD-10-CM | POA: Insufficient documentation

## 2018-11-29 DIAGNOSIS — R7303 Prediabetes: Secondary | ICD-10-CM

## 2018-11-29 DIAGNOSIS — E785 Hyperlipidemia, unspecified: Secondary | ICD-10-CM | POA: Diagnosis not present

## 2018-11-29 DIAGNOSIS — N183 Chronic kidney disease, stage 3 unspecified: Secondary | ICD-10-CM

## 2018-11-29 DIAGNOSIS — F329 Major depressive disorder, single episode, unspecified: Secondary | ICD-10-CM

## 2018-11-29 DIAGNOSIS — F32A Depression, unspecified: Secondary | ICD-10-CM

## 2018-11-29 LAB — POCT GLYCOSYLATED HEMOGLOBIN (HGB A1C): Hemoglobin A1C: 6.3 % — AB (ref 4.0–5.6)

## 2018-11-29 NOTE — Assessment & Plan Note (Signed)
Blood pressure is excellent- continue to check at home. A1c (average of your blood sugar over last 3 months) is 6.3, up from 6.2 in Jan. This is considered pre-diabetes- recommend that you reduce sugar/carbohyrdate intake and increase daily walking- 48mins twice daily. We will call you when lab results are available. Continue all medications as directed. Remain well hydrated with water, follow heart healthy diet. Continue to social distance and wear a mask when in public. Follow-up 3 months.

## 2018-11-29 NOTE — Assessment & Plan Note (Signed)
BP at goa- 119/72, HR 73 Ambulatory Readings SBP 110-120s DBP 70s HR 70-80s She has been on Amlodipine for years Currently 5 mg QD

## 2018-11-29 NOTE — Patient Instructions (Addendum)
Managing Your Hypertension Hypertension is commonly called high blood pressure. This is when the force of your blood pressing against the walls of your arteries is too strong. Arteries are blood vessels that carry blood from your heart throughout your body. Hypertension forces the heart to work harder to pump blood, and may cause the arteries to become narrow or stiff. Having untreated or uncontrolled hypertension can cause heart attack, stroke, kidney disease, and other problems. What are blood pressure readings? A blood pressure reading consists of a higher number over a lower number. Ideally, your blood pressure should be below 120/80. The first ("top") number is called the systolic pressure. It is a measure of the pressure in your arteries as your heart beats. The second ("bottom") number is called the diastolic pressure. It is a measure of the pressure in your arteries as the heart relaxes. What does my blood pressure reading mean? Blood pressure is classified into four stages. Based on your blood pressure reading, your health care provider may use the following stages to determine what type of treatment you need, if any. Systolic pressure and diastolic pressure are measured in a unit called mm Hg. Normal  Systolic pressure: below 784.  Diastolic pressure: below 80. Elevated  Systolic pressure: 696-295.  Diastolic pressure: below 80. Hypertension stage 1  Systolic pressure: 284-132.  Diastolic pressure: 44-01. Hypertension stage 2  Systolic pressure: 027 or above.  Diastolic pressure: 90 or above. What health risks are associated with hypertension? Managing your hypertension is an important responsibility. Uncontrolled hypertension can lead to:  A heart attack.  A stroke.  A weakened blood vessel (aneurysm).  Heart failure.  Kidney damage.  Eye damage.  Metabolic syndrome.  Memory and concentration problems. What changes can I make to manage my hypertension?  Hypertension can be managed by making lifestyle changes and possibly by taking medicines. Your health care provider will help you make a plan to bring your blood pressure within a normal range. Eating and drinking   Eat a diet that is high in fiber and potassium, and low in salt (sodium), added sugar, and fat. An example eating plan is called the DASH (Dietary Approaches to Stop Hypertension) diet. To eat this way: ? Eat plenty of fresh fruits and vegetables. Try to fill half of your plate at each meal with fruits and vegetables. ? Eat whole grains, such as whole wheat pasta, brown rice, or whole grain bread. Fill about one quarter of your plate with whole grains. ? Eat low-fat diary products. ? Avoid fatty cuts of meat, processed or cured meats, and poultry with skin. Fill about one quarter of your plate with lean proteins such as fish, chicken without skin, beans, eggs, and tofu. ? Avoid premade and processed foods. These tend to be higher in sodium, added sugar, and fat.  Reduce your daily sodium intake. Most people with hypertension should eat less than 1,500 mg of sodium a day.  Limit alcohol intake to no more than 1 drink a day for nonpregnant women and 2 drinks a day for men. One drink equals 12 oz of beer, 5 oz of wine, or 1 oz of hard liquor. Lifestyle  Work with your health care provider to maintain a healthy body weight, or to lose weight. Ask what an ideal weight is for you.  Get at least 30 minutes of exercise that causes your heart to beat faster (aerobic exercise) most days of the week. Activities may include walking, swimming, or biking.  Include exercise  to strengthen your muscles (resistance exercise), such as weight lifting, as part of your weekly exercise routine. Try to do these types of exercises for 30 minutes at least 3 days a week.  Do not use any products that contain nicotine or tobacco, such as cigarettes and e-cigarettes. If you need help quitting, ask your health  care provider.  Control any long-term (chronic) conditions you have, such as high cholesterol or diabetes. Monitoring  Monitor your blood pressure at home as told by your health care provider. Your personal target blood pressure may vary depending on your medical conditions, your age, and other factors.  Have your blood pressure checked regularly, as often as told by your health care provider. Working with your health care provider  Review all the medicines you take with your health care provider because there may be side effects or interactions.  Talk with your health care provider about your diet, exercise habits, and other lifestyle factors that may be contributing to hypertension.  Visit your health care provider regularly. Your health care provider can help you create and adjust your plan for managing hypertension. Will I need medicine to control my blood pressure? Your health care provider may prescribe medicine if lifestyle changes are not enough to get your blood pressure under control, and if:  Your systolic blood pressure is 130 or higher.  Your diastolic blood pressure is 80 or higher. Take medicines only as told by your health care provider. Follow the directions carefully. Blood pressure medicines must be taken as prescribed. The medicine does not work as well when you skip doses. Skipping doses also puts you at risk for problems. Contact a health care provider if:  You think you are having a reaction to medicines you have taken.  You have repeated (recurrent) headaches.  You feel dizzy.  You have swelling in your ankles.  You have trouble with your vision. Get help right away if:  You develop a severe headache or confusion.  You have unusual weakness or numbness, or you feel faint.  You have severe pain in your chest or abdomen.  You vomit repeatedly.  You have trouble breathing. Summary  Hypertension is when the force of blood pumping through your arteries  is too strong. If this condition is not controlled, it may put you at risk for serious complications.  Your personal target blood pressure may vary depending on your medical conditions, your age, and other factors. For most people, a normal blood pressure is less than 120/80.  Hypertension is managed by lifestyle changes, medicines, or both. Lifestyle changes include weight loss, eating a healthy, low-sodium diet, exercising more, and limiting alcohol. This information is not intended to replace advice given to you by your health care provider. Make sure you discuss any questions you have with your health care provider. Document Released: 12/01/2011 Document Revised: 06/30/2018 Document Reviewed: 02/04/2016 Elsevier Patient Education  2020 [employer].  Blood pressure is excellent- continue to check at home. A1c (average of your blood sugar over last 3 months) is 6.3, up from 6.2 in Jan. This is considered pre-diabetes- recommend that you reduce sugar/carbohyrdate intake and increase daily walking- 63mins twice daily. We will call you when lab results are available. Continue all medications as directed. Remain well hydrated with water, follow heart healthy diet. Continue to social distance and wear a mask when in public. Follow-up 3 months. GREAT TO SEE YOU!

## 2018-11-29 NOTE — Assessment & Plan Note (Signed)
Mood stable, denies SI/HI Currently on Fluoxetine 40mg  QD

## 2018-11-29 NOTE — Assessment & Plan Note (Signed)
Lab Results  Component Value Date   HGBA1C 6.3 (A) 11/29/2018   HGBA1C 6.2 (A) 08/29/2018   HGBA1C 5.9 (H) 06/22/2017  Stressed lifestyle changes F/u 3 months- re-check A1c

## 2018-11-29 NOTE — Assessment & Plan Note (Addendum)
Followed by Nephrology for CKD- last OV 7/8//2020- no change to current medication regime Benign hypertension with CKD (chronic kidney disease) stage III ([facility])  1) Continue present regimen. 2) Low salt diet, exercise and weight loss. 3) Increase water intake. 4) Monitor and record home BP. 5) Send urine for culture. 6) Repeat labs before next visit.  She reports ambulatory BP SBP 110-120s DBP 70s  Currently on Amlodipine 5mg  QD  CMP drawn today- will send over to Nephrology

## 2018-11-29 NOTE — Assessment & Plan Note (Signed)
Lipid Panel drawn Currently on Atorvastatin 20mg QD  

## 2018-11-30 LAB — LIPID PANEL
Chol/HDL Ratio: 2.7 ratio (ref 0.0–4.4)
Cholesterol, Total: 151 mg/dL (ref 100–199)
HDL: 56 mg/dL (ref >39)
LDL Chol Calc (NIH): 77 mg/dL (ref 0–99)
Triglycerides: 99 mg/dL (ref 0–149)
VLDL Cholesterol Cal: 18 mg/dL (ref 5–40)

## 2018-11-30 LAB — COMPREHENSIVE METABOLIC PANEL
ALT: 22 IU/L (ref 0–32)
AST: 21 IU/L (ref 0–40)
Albumin/Globulin Ratio: 1.6 (ref 1.2–2.2)
Albumin: 4.1 g/dL (ref 3.7–4.7)
Alkaline Phosphatase: 82 IU/L (ref 39–117)
BUN/Creatinine Ratio: 17 (ref 12–28)
BUN: 21 mg/dL (ref 8–27)
Bilirubin Total: 0.2 mg/dL (ref 0.0–1.2)
CO2: 26 mmol/L (ref 20–29)
Calcium: 9.3 mg/dL (ref 8.7–10.3)
Chloride: 104 mmol/L (ref 96–106)
Creatinine, Ser: 1.21 mg/dL — ABNORMAL HIGH (ref 0.57–1.00)
GFR calc Af Amer: 52 mL/min/{1.73_m2} — ABNORMAL LOW (ref >59)
GFR calc non Af Amer: 45 mL/min/{1.73_m2} — ABNORMAL LOW (ref >59)
Globulin, Total: 2.6 g/dL (ref 1.5–4.5)
Glucose: 154 mg/dL — ABNORMAL HIGH (ref 65–99)
Potassium: 4.4 mmol/L (ref 3.5–5.2)
Sodium: 142 mmol/L (ref 134–144)
Total Protein: 6.7 g/dL (ref 6.0–8.5)

## 2018-12-11 ENCOUNTER — Other Ambulatory Visit: Payer: Self-pay | Admitting: Adult Health

## 2019-01-29 ENCOUNTER — Telehealth: Payer: Self-pay

## 2019-01-29 NOTE — Telephone Encounter (Signed)
Yes, same instructions and 3 refills - to her preferred pharmacy please.  - HD

## 2019-01-29 NOTE — Telephone Encounter (Signed)
Incoming fax request for Glycopyrrolate 2 mg tabs 1 po BID. Last seen 08-2018.

## 2019-01-30 ENCOUNTER — Other Ambulatory Visit: Payer: Self-pay

## 2019-01-30 MED ORDER — GLYCOPYRROLATE 2 MG PO TABS: 2.0000 mg | ORAL_TABLET | Freq: Two times a day (BID) | ORAL | 3 refills | Status: DC

## 2019-01-30 NOTE — Telephone Encounter (Signed)
Done

## 2019-02-08 ENCOUNTER — Other Ambulatory Visit: Payer: Self-pay | Admitting: Adult Health

## 2019-02-27 NOTE — Progress Notes (Signed)
Subjective:    Patient ID: Dawn Cox, female    DOB: [birth year], 73 y.o.   MRN: [id number]  HPI:  :08/29/2018 OV: [name] presents for CPE PMH:HTN, HLD, Depression, SLE- followed by Rhuematology She reports medication compliance, denies SE She has gained 10 lbs since last OV in Jan 2020 Current wt 226 Since COVID-19 pandemic started, she has not been walking or performing in any form of exercise. She reports increase in snacking.  Last seen by Nephrology- 04/17/2018: No change to medication regime, increase water intake, ambulatory BP monitoring, Low salt diet, increase exercise, encouraged wt loss F/u 10/16/2018  04/03/2018 Labs: CMP- stable re: CKD TSH-WNL, 3.120 The 10-year ASCVD risk score Denman George(Goff DC Jr., et al., 2013) is: 12.7% Values used to calculate the score:  Age: [age] years  Sex: Female  Is Non-Hispanic African American: Yes  Diabetic: No  Tobacco smoker: No  Systolic Blood Pressure: 125 mmHg  Is BP treated: Yes  HDL Cholesterol: 49 mg/dL  Total Cholesterol: 409204 mg/dL [id number] Encouraged to reduce saturated fat and increase regular exercise   11/29/2018 OV:   [name] is here for 3 month f/u: TN, HLD, Depression, SLE- followed by Rhuematology Followed by [facility]-last OV 09/12/2018-IBS-D, treated with Robinul Colonoscopy ordered Followed by Nephrology for CKD- last OV 7/8//2020- no change to current medication regime Benign hypertension with CKD (chronic kidney disease) stage III (HCC)  1) Continue present regimen. 2) Low salt diet, exercise and weight loss. 3) Increase water intake. 4) Monitor and record home BP. 5) Send urine for culture. 6) Repeat labs before next visit.  She reports ambulatory BP SBP 110-120s DBP 70s  Recent Labs       Lab Results  Component Value Date   HGBA1C 6.3 (A) 11/29/2018   HGBA1C 6.2 (A) 08/29/2018   HGBA1C 5.9 (H) 06/22/2017     Really stressed the importance of diet, weight reduction,  increasing regular exercise  She estimates to 24 oz x 3/day, no water restrictions per Nephrology  Will send CMP results to Nephrology  02/28/2019 OV: [name] is here for 3 month f/u: HTN. HLD, Depression,CKD Stage III- followed by Nephrology, SLE-followed by Rheumatology She reports ambulatory  SBP 116-130s, usually 120s DBP 60-70s HR 60-80s She denies CP/chest tightness with exertion She reports intermittent dizziness the last 2 weeks, denies falls or syncope She has not checked her BP/BG when these episodes occur. She feels that these episodes will occur as complications with her SLE. She denies any new Rx She estimates to drink >50 oz water/day and has been following a diabetic diet. Wt stable- 225 Body mass index is 37.48 kg/m.  She performs seated exercises and dancing for exercise.  Mood stable, denies SI/HI- continue Fluoxetine 40mg  QD  Lab Results  Component Value Date   HGBA1C 6.2 (A) 02/28/2019   HGBA1C 6.3 (A) 11/29/2018   HGBA1C 6.2 (A) 08/29/2018   Patient Care Team    Relationship Specialty Notifications Start End  William Hamburgeranford, Kailen Name D, NP PCP - General Family Medicine  05/25/17   Crecencio McKodali, Valli, MD Referring Physician Gastroenterology  05/31/17    Comment: Office address [address] [place], [place] [address]: phone [phone]  Farrel ConnersNwobu, Ikechukwu O, MD Consulting Physician Nephrology  11/30/18     Patient Active Problem List   Diagnosis Date Noted  . Dizziness 02/28/2019  . Chronic kidney disease (CKD), stage III (moderate) 11/29/2018  . Pre-diabetes 11/29/2018  . Blood glucose elevated 08/29/2018  . Abnormal urinalysis 04/03/2018  .  Depression 12/22/2017  . Perioral dermatitis 06/22/2017  . Diverticulitis 05/25/2017  . Hx of systemic lupus erythematosus (SLE) ([place]) 05/25/2017  . Other fatigue 05/25/2017  . Hypertension 05/25/2017  . Hyperlipidemia 05/25/2017  . Healthcare maintenance 05/25/2017  . Screening for breast cancer 05/25/2017  . Body aches  05/25/2017     Past Medical History:  Diagnosis Date  . Ankle fracture    right  . Anxiety   . Depression   . Diverticulitis   . Hyperlipidemia   . Hypertension   . Lupus ([place])   . Retinal detachment   . Retinal hemorrhage      Past Surgical History:  Procedure Laterality Date  . ABDOMINAL HYSTERECTOMY    . BREAST BIOPSY    . BREAST SURGERY Right    cyst removal  . CATARACT EXTRACTION Bilateral   . EYE SURGERY Right    hemorrhagia of retina  . HERNIA REPAIR    . RETINAL DETACHMENT SURGERY       Family History  Problem Relation Age of Onset  . Heart attack Mother   . Liver cancer Father   . Depression Daughter        suicide  . Breast cancer Cousin   . Colon cancer Neg Hx   . Stomach cancer Neg Hx   . Esophageal cancer Neg Hx   . Rectal cancer Neg Hx      Social History   Substance and Sexual Activity  Drug Use No     Social History   Substance and Sexual Activity  Alcohol Use Yes  . Frequency: Never   Comment: rarely     Social History   Tobacco Use  Smoking Status Never Smoker  Smokeless Tobacco Never Used     Outpatient Encounter Medications as of 02/28/2019  Medication Sig  . albuterol (PROVENTIL HFA;VENTOLIN HFA) 108 (90 Base) MCG/ACT inhaler Inhale 1 puff into the lungs every 6 (six) hours as needed for wheezing or shortness of breath.  Marland Kitchen amLODipine (NORVASC) 5 MG tablet TAKE 1 TABLET(5 MG) BY MOUTH DAILY  . aspirin 325 MG tablet Take 325 mg by mouth every 4 (four) hours as needed.  Marland Kitchen atorvastatin (LIPITOR) 20 MG tablet TAKE 1 TABLET BY MOUTH EVERY DAY  . FLUoxetine (PROZAC) 40 MG capsule TAKE 1 CAPSULE(40 MG) BY MOUTH DAILY  . glycopyrrolate (ROBINUL) 2 MG tablet Take 1 tablet (2 mg total) by mouth 2 (two) times daily.  . hydroxychloroquine (PLAQUENIL) 200 MG tablet Take 200 mg by mouth 2 (two) times daily.  . Multiple Vitamins-Minerals (HAIR SKIN AND NAILS FORMULA PO) Take 1 tablet by mouth daily.  . Turmeric 500 MG CAPS Take  1,000 mg by mouth daily.   No facility-administered encounter medications on file as of 02/28/2019.     Allergies: Aloe, Latex, Other, Shellfish allergy, and Vibramycin [doxycycline calcium]  Body mass index is 37.48 kg/m.  Blood pressure 122/64, pulse 78, temperature 98.9 F (37.2 C), temperature source Oral, height 5\' 5"  (1.651 m), weight 225 lb 3.2 oz (102.2 kg), SpO2 99 %.  Review of Systems  Constitutional: Positive for fatigue. Negative for activity change, appetite change, chills, diaphoresis, fever and unexpected weight change.  Eyes: Negative for visual disturbance.  Respiratory: Negative for cough, chest tightness, shortness of breath, wheezing and stridor.   Cardiovascular: Negative for chest pain, palpitations and leg swelling.  Gastrointestinal: Negative for abdominal distention, abdominal pain, blood in stool, constipation, diarrhea, nausea and vomiting.  Endocrine: Negative for polydipsia, polyphagia and polyuria.  Genitourinary: Negative for difficulty urinating.  Musculoskeletal: Positive for arthralgias, back pain, gait problem, joint swelling and myalgias.  Neurological: Positive for dizziness. Negative for headaches.  Hematological: Negative for adenopathy. Does not bruise/bleed easily.  Psychiatric/Behavioral: Negative for agitation, behavioral problems, confusion, decreased concentration, dysphoric mood, hallucinations, self-injury, sleep disturbance and suicidal ideas. The patient is not nervous/anxious and is not hyperactive.        Objective:   Physical Exam Vitals signs and nursing note reviewed.  Constitutional:      General: She is not in acute distress.    Appearance: She is obese. She is not ill-appearing, toxic-appearing or diaphoretic.  HENT:     Head: Normocephalic and atraumatic.  Eyes:     Extraocular Movements: Extraocular movements intact.     Conjunctiva/sclera: Conjunctivae normal.     Pupils: Pupils are equal, round, and reactive to  light.  Cardiovascular:     Rate and Rhythm: Normal rate and regular rhythm.     Pulses: Normal pulses.     Heart sounds: Normal heart sounds. No murmur. No friction rub. No gallop.   Pulmonary:     Effort: Pulmonary effort is normal. No respiratory distress.     Breath sounds: Normal breath sounds. No stridor. No wheezing, rhonchi or rales.  Chest:     Chest wall: No tenderness.  Skin:    General: Skin is warm and dry.     Capillary Refill: Capillary refill takes less than 2 seconds.  Neurological:     Mental Status: She is alert and oriented to person, place, and time.  Psychiatric:        Mood and Affect: Mood normal.        Behavior: Behavior normal.        Thought Content: Thought content normal.        Judgment: Judgment normal.        Assessment & Plan:   1. Blood glucose elevated   2. Pre-diabetes   3. Healthcare maintenance   4. Hypertension, unspecified type   5. Depression, unspecified depression type   6. Dizziness   7. Hyperlipidemia, unspecified hyperlipidemia type     Healthcare maintenance Orthostatic blood pressure check was stable. A1c is 6.2, improved from 6.3 in Sept. If you experience dizziness again-check your blood pressure and blood sugar. If blood pressure <100/60 or blood sugar <90- call clinic. Remain well hydrated, continue low sugar/carbohydrate diet- but ensure that you are eating every few hours to keep blood sugar levels stable. Continue regular follow-up with Nephrology and Rheumatology. Continue all medications as directed. Follow-up here 3 months. Continue to social distance and wear a mask when in public.  Hypertension Amlodipine 5mg  QD She reports ambulatory  SBP 116-130s, usually 120s DBP 60-70s HR 60-80s She denies CP/chest tightness with exertion Advised to CP/HR at home- call clinic if BP <100/60 or >140/90  Depression Mood stable, denies SI/HI- continue Fluoxetine 40mg  QD  Dizziness Orthostatic blood pressure check  was stable. A1c is 6.2, improved from 6.3 in Sept. If you experience dizziness again-check your blood pressure and blood sugar. If blood pressure <100/60 or blood sugar <90- call clinic. Remain well hydrated, continue low sugar/carbohydrate diet- but ensure that you are eating every few hours to keep blood sugar levels stable.  Hyperlipidemia Atorvastatin 20mg  QD    FOLLOW-UP:  Return in about 3 months (around 05/29/2019) for Regular Follow Up, HTN, Hypercholestermia.

## 2019-02-28 ENCOUNTER — Ambulatory Visit: Payer: Federal, State, Local not specified - PPO | Admitting: Adult Health

## 2019-02-28 ENCOUNTER — Encounter: Payer: Self-pay | Admitting: Adult Health

## 2019-02-28 ENCOUNTER — Other Ambulatory Visit: Payer: Self-pay

## 2019-02-28 VITALS — BP 122/64 | HR 78 | Temp 98.9°F | Ht 65.0 in | Wt 225.2 lb

## 2019-02-28 DIAGNOSIS — I10 Essential (primary) hypertension: Secondary | ICD-10-CM | POA: Diagnosis not present

## 2019-02-28 DIAGNOSIS — R739 Hyperglycemia, unspecified: Secondary | ICD-10-CM

## 2019-02-28 DIAGNOSIS — F329 Major depressive disorder, single episode, unspecified: Secondary | ICD-10-CM

## 2019-02-28 DIAGNOSIS — Z0000 Encounter for general adult medical examination without abnormal findings: Secondary | ICD-10-CM | POA: Diagnosis not present

## 2019-02-28 DIAGNOSIS — R7303 Prediabetes: Secondary | ICD-10-CM

## 2019-02-28 DIAGNOSIS — R42 Dizziness and giddiness: Secondary | ICD-10-CM

## 2019-02-28 DIAGNOSIS — F32A Depression, unspecified: Secondary | ICD-10-CM

## 2019-02-28 DIAGNOSIS — E785 Hyperlipidemia, unspecified: Secondary | ICD-10-CM

## 2019-02-28 LAB — POCT GLYCOSYLATED HEMOGLOBIN (HGB A1C): Hemoglobin A1C: 6.2 % — AB (ref 4.0–5.6)

## 2019-02-28 NOTE — Assessment & Plan Note (Signed)
Orthostatic blood pressure check was stable. A1c is 6.2, improved from 6.3 in Sept. If you experience dizziness again-check your blood pressure and blood sugar. If blood pressure <100/60 or blood sugar <90- call clinic. Remain well hydrated, continue low sugar/carbohydrate diet- but ensure that you are eating every few hours to keep blood sugar levels stable.

## 2019-02-28 NOTE — Assessment & Plan Note (Signed)
Amlodipine 5mg  QD She reports ambulatory  SBP 116-130s, usually 120s DBP 60-70s HR 60-80s She denies CP/chest tightness with exertion Advised to CP/HR at home- call clinic if BP <100/60 or >140/90

## 2019-02-28 NOTE — Patient Instructions (Addendum)
Managing Your Hypertension Hypertension is commonly called high blood pressure. This is when the force of your blood pressing against the walls of your arteries is too strong. Arteries are blood vessels that carry blood from your heart throughout your body. Hypertension forces the heart to work harder to pump blood, and may cause the arteries to become narrow or stiff. Having untreated or uncontrolled hypertension can cause heart attack, stroke, kidney disease, and other problems. What are blood pressure readings? A blood pressure reading consists of a higher number over a lower number. Ideally, your blood pressure should be below 120/80. The first ("top") number is called the systolic pressure. It is a measure of the pressure in your arteries as your heart beats. The second ("bottom") number is called the diastolic pressure. It is a measure of the pressure in your arteries as the heart relaxes. What does my blood pressure reading mean? Blood pressure is classified into four stages. Based on your blood pressure reading, your health care provider may use the following stages to determine what type of treatment you need, if any. Systolic pressure and diastolic pressure are measured in a unit called mm Hg. Normal  Systolic pressure: below 784.  Diastolic pressure: below 80. Elevated  Systolic pressure: 696-295.  Diastolic pressure: below 80. Hypertension stage 1  Systolic pressure: 284-132.  Diastolic pressure: 44-01. Hypertension stage 2  Systolic pressure: 027 or above.  Diastolic pressure: 90 or above. What health risks are associated with hypertension? Managing your hypertension is an important responsibility. Uncontrolled hypertension can lead to:  A heart attack.  A stroke.  A weakened blood vessel (aneurysm).  Heart failure.  Kidney damage.  Eye damage.  Metabolic syndrome.  Memory and concentration problems. What changes can I make to manage my hypertension?  Hypertension can be managed by making lifestyle changes and possibly by taking medicines. Your health care provider will help you make a plan to bring your blood pressure within a normal range. Eating and drinking   Eat a diet that is high in fiber and potassium, and low in salt (sodium), added sugar, and fat. An example eating plan is called the DASH (Dietary Approaches to Stop Hypertension) diet. To eat this way: ? Eat plenty of fresh fruits and vegetables. Try to fill half of your plate at each meal with fruits and vegetables. ? Eat whole grains, such as whole wheat pasta, brown rice, or whole grain bread. Fill about one quarter of your plate with whole grains. ? Eat low-fat diary products. ? Avoid fatty cuts of meat, processed or cured meats, and poultry with skin. Fill about one quarter of your plate with lean proteins such as fish, chicken without skin, beans, eggs, and tofu. ? Avoid premade and processed foods. These tend to be higher in sodium, added sugar, and fat.  Reduce your daily sodium intake. Most people with hypertension should eat less than 1,500 mg of sodium a day.  Limit alcohol intake to no more than 1 drink a day for nonpregnant women and 2 drinks a day for men. One drink equals 12 oz of beer, 5 oz of wine, or 1 oz of hard liquor. Lifestyle  Work with your health care provider to maintain a healthy body weight, or to lose weight. Ask what an ideal weight is for you.  Get at least 30 minutes of exercise that causes your heart to beat faster (aerobic exercise) most days of the week. Activities may include walking, swimming, or biking.  Include exercise  to strengthen your muscles (resistance exercise), such as weight lifting, as part of your weekly exercise routine. Try to do these types of exercises for 30 minutes at least 3 days a week.  Do not use any products that contain nicotine or tobacco, such as cigarettes and e-cigarettes. If you need help quitting, ask your health  care provider.  Control any long-term (chronic) conditions you have, such as high cholesterol or diabetes. Monitoring  Monitor your blood pressure at home as told by your health care provider. Your personal target blood pressure may vary depending on your medical conditions, your age, and other factors.  Have your blood pressure checked regularly, as often as told by your health care provider. Working with your health care provider  Review all the medicines you take with your health care provider because there may be side effects or interactions.  Talk with your health care provider about your diet, exercise habits, and other lifestyle factors that may be contributing to hypertension.  Visit your health care provider regularly. Your health care provider can help you create and adjust your plan for managing hypertension. Will I need medicine to control my blood pressure? Your health care provider may prescribe medicine if lifestyle changes are not enough to get your blood pressure under control, and if:  Your systolic blood pressure is 130 or higher.  Your diastolic blood pressure is 80 or higher. Take medicines only as told by your health care provider. Follow the directions carefully. Blood pressure medicines must be taken as prescribed. The medicine does not work as well when you skip doses. Skipping doses also puts you at risk for problems. Contact a health care provider if:  You think you are having a reaction to medicines you have taken.  You have repeated (recurrent) headaches.  You feel dizzy.  You have swelling in your ankles.  You have trouble with your vision. Get help right away if:  You develop a severe headache or confusion.  You have unusual weakness or numbness, or you feel faint.  You have severe pain in your chest or abdomen.  You vomit repeatedly.  You have trouble breathing. Summary  Hypertension is when the force of blood pumping through your arteries  is too strong. If this condition is not controlled, it may put you at risk for serious complications.  Your personal target blood pressure may vary depending on your medical conditions, your age, and other factors. For most people, a normal blood pressure is less than 120/80.  Hypertension is managed by lifestyle changes, medicines, or both. Lifestyle changes include weight loss, eating a healthy, low-sodium diet, exercising more, and limiting alcohol. This information is not intended to replace advice given to you by your health care provider. Make sure you discuss any questions you have with your health care provider. Document Released: 12/01/2011 Document Revised: 06/30/2018 Document Reviewed: 02/04/2016 Elsevier Patient Education  2020 [employer].  Orthostatic blood pressure check was stable. A1c is 6.2, improved from 6.3 in Sept. If you experience dizziness again-check your blood pressure and blood sugar. If blood pressure <100/60 or blood sugar <90- call clinic. Remain well hydrated, continue low sugar/carbohydrate diet- but ensure that you are eating every few hours to keep blood sugar levels stable. Continue regular follow-up with Nephrology and Rheumatology. Continue all medications as directed. Follow-up here 3 months. Continue to social distance and wear a mask when in public. GREAT TO SEE YOU!

## 2019-02-28 NOTE — Assessment & Plan Note (Signed)
Mood stable, denies SI/HI- continue Fluoxetine 40mg  QD

## 2019-02-28 NOTE — Assessment & Plan Note (Signed)
Atorvastatin 20mg QD 

## 2019-02-28 NOTE — Assessment & Plan Note (Signed)
Orthostatic blood pressure check was stable. A1c is 6.2, improved from 6.3 in Sept. If you experience dizziness again-check your blood pressure and blood sugar. If blood pressure <100/60 or blood sugar <90- call clinic. Remain well hydrated, continue low sugar/carbohydrate diet- but ensure that you are eating every few hours to keep blood sugar levels stable. Continue regular follow-up with Nephrology and Rheumatology. Continue all medications as directed. Follow-up here 3 months. Continue to social distance and wear a mask when in public.

## 2019-03-02 ENCOUNTER — Other Ambulatory Visit: Payer: Self-pay | Admitting: Adult Health

## 2019-03-05 ENCOUNTER — Other Ambulatory Visit: Payer: Self-pay | Admitting: Adult Health

## 2019-03-28 LAB — COMPREHENSIVE METABOLIC PANEL
Albumin: 4.2 (ref 3.5–5.0)
Calcium: 9.3 (ref 8.7–10.7)

## 2019-03-28 LAB — BASIC METABOLIC PANEL
BUN: 10 (ref 4–21)
CO2: 22 (ref 13–22)
Chloride: 102 (ref 99–108)
Creatinine: 1 (ref 0.5–1.1)
Glucose: 137
Potassium: 3.9 (ref 3.4–5.3)
Sodium: 140 (ref 137–147)

## 2019-03-28 LAB — HEPATIC FUNCTION PANEL
ALT: 26 (ref 7–35)
AST: 26 (ref 13–35)
Alkaline Phosphatase: 103 (ref 25–125)
Bilirubin, Total: 0.5

## 2019-03-28 LAB — TSH: TSH: 2.69 (ref 0.41–5.90)

## 2019-03-28 LAB — CBC AND DIFFERENTIAL
HCT: 38 (ref 36–46)
Hemoglobin: 12.7 (ref 12.0–16.0)
Neutrophils Absolute: 2
Platelets: 211 (ref 150–399)
WBC: 5.4

## 2019-03-28 LAB — CBC: RBC: 4.3 (ref 3.87–5.11)

## 2019-04-11 ENCOUNTER — Encounter: Payer: Self-pay | Admitting: Adult Health

## 2019-05-14 ENCOUNTER — Other Ambulatory Visit: Payer: Self-pay | Admitting: Adult Health

## 2019-05-29 ENCOUNTER — Ambulatory Visit: Payer: Federal, State, Local not specified - PPO | Admitting: Adult Health

## 2019-05-29 ENCOUNTER — Other Ambulatory Visit: Payer: Self-pay | Admitting: Adult Health

## 2019-05-31 ENCOUNTER — Other Ambulatory Visit: Payer: Self-pay | Admitting: Adult Health

## 2019-06-18 ENCOUNTER — Other Ambulatory Visit: Payer: Self-pay | Admitting: Adult Health

## 2019-06-19 ENCOUNTER — Other Ambulatory Visit: Payer: Self-pay | Admitting: Adult Health

## 2019-06-19 MED ORDER — ATORVASTATIN CALCIUM 20 MG PO TABS: 20.0000 mg | ORAL_TABLET | Freq: Every day | ORAL | 0 refills | Status: DC

## 2019-06-19 NOTE — Telephone Encounter (Signed)
Patient called states pharmacy advised refill on :  (Denied)  atorvastatin (LIPITOR) 20 MG tablet [[id number]]   Order Details Dose, Route, Frequency: As Directed  Dispense Quantity: 90 tablet Refills: 0       Sig: TAKE 1 TABLET BY MOUTH EVERY DAY   ---pt wonders why, her LOV was 12/9 w/ Katy& she has an upcoming appt that was moved from 3/9 to 4/12 (not her fault).  --Forwarding refill request to med asst.  --glh

## 2019-06-19 NOTE — Telephone Encounter (Signed)
Refill sent to pharmacy.  Pt informed.  T. Emberlie Gotcher, CMA 

## 2019-07-02 ENCOUNTER — Ambulatory Visit: Payer: Federal, State, Local not specified - PPO | Admitting: Family Medicine

## 2019-07-10 ENCOUNTER — Other Ambulatory Visit: Payer: Self-pay

## 2019-07-10 ENCOUNTER — Ambulatory Visit: Payer: Federal, State, Local not specified - PPO | Admitting: Physician Assistant

## 2019-07-10 ENCOUNTER — Encounter: Payer: Self-pay | Admitting: Physician Assistant

## 2019-07-10 VITALS — BP 112/71 | HR 78 | Temp 98.0°F | Ht 65.0 in | Wt 229.9 lb

## 2019-07-10 DIAGNOSIS — E785 Hyperlipidemia, unspecified: Secondary | ICD-10-CM | POA: Diagnosis not present

## 2019-07-10 DIAGNOSIS — R7303 Prediabetes: Secondary | ICD-10-CM | POA: Diagnosis not present

## 2019-07-10 DIAGNOSIS — I10 Essential (primary) hypertension: Secondary | ICD-10-CM

## 2019-07-10 DIAGNOSIS — F329 Major depressive disorder, single episode, unspecified: Secondary | ICD-10-CM | POA: Diagnosis not present

## 2019-07-10 DIAGNOSIS — R1011 Right upper quadrant pain: Secondary | ICD-10-CM

## 2019-07-10 DIAGNOSIS — F32A Depression, unspecified: Secondary | ICD-10-CM

## 2019-07-10 MED ORDER — ROSUVASTATIN CALCIUM 5 MG PO TABS: 5.0000 mg | ORAL_TABLET | Freq: Every day | ORAL | 3 refills | Status: DC

## 2019-07-10 NOTE — Patient Instructions (Signed)

## 2019-07-10 NOTE — Progress Notes (Signed)
Established Patient Office Visit  Subjective:  Patient ID: Dawn Cox, female    DOB: [birth year]  Age: 74 y.o. MRN: [id number]  CC:  Chief Complaint  Patient presents with  . Hyperlipidemia  . Hypertension    HPI Dawn Cox presents for 3 month follow-up on HTN, HDL, Pre-diabetes and mood management.  HTN: Pt denies chest pain, palpitations, headache, or dizziness. Pt checks BP at home twice/wk and range in 120s/70s. Taking medication as directed. Denies medication side effects.   HDL: Pt reports good compliance with medication. States she's been having right side pain, which started shortly after re-starting her atorvastatin and feels like maybe it's a side effect from the medication. The pain can sometimes be sharp, but its mostly dull. Denies nausea, vomiting, or bowel changes.  Pre-diabetes: Pt denies polyuria/polydipsia, hypo/hyperglycemia symptoms. She doesn't check blood sugars at home. She has been trying to work on diet and states she knows she has to loose some weight. She is limited on her physical activity level because she gets "short winded."  Mood: Denies SI/HI. Taking medication as directed.   Past Medical History:  Diagnosis Date  . Ankle fracture    right  . Anxiety   . Depression   . Diverticulitis   . Hyperlipidemia   . Hypertension   . Lupus (HCC)   . Retinal detachment   . Retinal hemorrhage     Past Surgical History:  Procedure Laterality Date  . ABDOMINAL HYSTERECTOMY    . BREAST BIOPSY    . BREAST SURGERY Right    cyst removal  . CATARACT EXTRACTION Bilateral   . EYE SURGERY Right    hemorrhagia of retina  . HERNIA REPAIR    . RETINAL DETACHMENT SURGERY      Family History  Problem Relation Age of Onset  . Heart attack Mother   . Liver cancer Father   . Depression Daughter        suicide  . Breast cancer Cousin   . Colon cancer Neg Hx   . Stomach cancer Neg Hx   . Esophageal cancer Neg Hx   . Rectal cancer Neg Hx     Social  History   Socioeconomic History  . Marital status: Married    Spouse name: [name]  . Number of children: 2  . Years of education: Not on file  . Highest education level: Not on file  Occupational History  . Occupation: retired  Tobacco Use  . Smoking status: Never Smoker  . Smokeless tobacco: Never Used  Substance and Sexual Activity  . Alcohol use: Yes    Comment: rarely  . Drug use: No  . Sexual activity: Not Currently  Other Topics Concern  . Not on file  Social History Narrative  . Not on file   Social Determinants of Health   Financial Resource Strain:   . Difficulty of Paying Living Expenses:   Food Insecurity:   . Worried About Programme researcher, broadcasting/film/video in the Last Year:   . Barista in the Last Year:   Transportation Needs:   . Freight forwarder (Medical):   Marland Kitchen Lack of Transportation (Non-Medical):   Physical Activity:   . Days of Exercise per Week:   . Minutes of Exercise per Session:   Stress:   . Feeling of Stress :   Social Connections:   . Frequency of Communication with Friends and Family:   . Frequency of Social Gatherings with Friends and Family:   .  Attends Religious Services:   . Active Member of Clubs or Organizations:   . Attends Banker Meetings:   Marland Kitchen Marital Status:   Intimate Partner Violence:   . Fear of Current or Ex-Partner:   . Emotionally Abused:   Marland Kitchen Physically Abused:   . Sexually Abused:     Outpatient Medications Prior to Visit  Medication Sig Dispense Refill  . albuterol (PROVENTIL HFA;VENTOLIN HFA) 108 (90 Base) MCG/ACT inhaler Inhale 1 puff into the lungs every 6 (six) hours as needed for wheezing or shortness of breath.    Marland Kitchen amLODipine (NORVASC) 5 MG tablet TAKE 1 TABLET(5 MG) BY MOUTH DAILY 90 tablet 0  . aspirin 325 MG tablet Take 325 mg by mouth every 4 (four) hours as needed.    Marland Kitchen FLUoxetine (PROZAC) 40 MG capsule TAKE 1 CAPSULE(40 MG) BY MOUTH DAILY 90 capsule 0  . glycopyrrolate (ROBINUL) 2 MG  tablet Take 1 tablet (2 mg total) by mouth 2 (two) times daily. 180 tablet 3  . hydroxychloroquine (PLAQUENIL) 200 MG tablet Take 200 mg by mouth 2 (two) times daily.    . Multiple Vitamins-Minerals (HAIR SKIN AND NAILS FORMULA PO) Take 1 tablet by mouth daily.    . Turmeric 500 MG CAPS Take 1,000 mg by mouth daily.    Marland Kitchen atorvastatin (LIPITOR) 20 MG tablet Take 1 tablet (20 mg total) by mouth daily. 90 tablet 0   No facility-administered medications prior to visit.    Allergies  Allergen Reactions  . Aloe   . Latex   . Other     aryrthromycin- IBS Xray dye  . Shellfish Allergy   . Vibramycin [Doxycycline Calcium]     ROS Review of Systems  Review of Systems:  A fourteen system review of systems was performed and found to be positive as per HPI.  Objective:    Physical Exam General:  Well Developed, well nourished, appropriate for stated age.  Neuro:  Alert and oriented HEENT:  Normocephalic, atraumatic ABD: Tenderness to palpation on RUQ underneath ribcage. No ecchymosis noted. NBS.   Skin:  no gross rash, warm, pink. Cardiac:  RRR, S1 S2,no murmr Respiratory:  ECTA B/L and A/P, Not using accessory muscles, speaking in full sentences- unlabored. Vascular:  Ext warm, no cyanosis apprec.; cap RF less 2 sec. Psych:  No HI/SI, judgement and insight good, Euthymic mood. Full Affect.  BP 112/71   Pulse 78   Temp 98 F (36.7 C) (Oral)   Ht 5\' 5"  (1.651 m)   Wt 229 lb 14.4 oz (104.3 kg)   SpO2 97% Comment: on RA  BMI 38.26 kg/m  Wt Readings from Last 3 Encounters:  07/10/19 229 lb 14.4 oz (104.3 kg)  02/28/19 225 lb 3.2 oz (102.2 kg)  11/29/18 223 lb (101.2 kg)     Health Maintenance Due  Topic Date Due  . Hepatitis C Screening  Never done  . COVID-19 Vaccine (1) Never done  . TETANUS/TDAP  07/18/2017    There are no preventive care reminders to display for this patient.  Lab Results  Component Value Date   TSH 2.69 03/28/2019   Lab Results  Component  Value Date   WBC 5.4 03/28/2019   HGB 12.7 03/28/2019   HCT 38 03/28/2019   MCV 88 06/22/2017   PLT 211 03/28/2019   Lab Results  Component Value Date   NA 140 03/28/2019   K 3.9 03/28/2019   CO2 22 03/28/2019   GLUCOSE 154 (H) 11/29/2018  BUN 10 03/28/2019   CREATININE 1.0 03/28/2019   BILITOT 0.2 11/29/2018   ALKPHOS 103 03/28/2019   AST 26 03/28/2019   ALT 26 03/28/2019   PROT 6.7 11/29/2018   ALBUMIN 4.2 03/28/2019   CALCIUM 9.3 03/28/2019   ANIONGAP 9 05/04/2017   Lab Results  Component Value Date   CHOL 151 11/29/2018   Lab Results  Component Value Date   HDL 56 11/29/2018   Lab Results  Component Value Date   LDLCALC 77 11/29/2018   Lab Results  Component Value Date   TRIG 99 11/29/2018   Lab Results  Component Value Date   CHOLHDL 2.7 11/29/2018   Lab Results  Component Value Date   HGBA1C 6.2 (A) 02/28/2019      Assessment & Plan:   Problem List Items Addressed This Visit      Cardiovascular and Mediastinum   Hypertension - Primary   Relevant Medications   rosuvastatin (CRESTOR) 5 MG tablet     Other   Depression (Chronic)   Hyperlipidemia   Relevant Medications   rosuvastatin (CRESTOR) 5 MG tablet   Pre-diabetes    Other Visit Diagnoses    Right upper quadrant pain          Meds ordered this encounter  Medications  . rosuvastatin (CRESTOR) 5 MG tablet    Sig: Take 1 tablet (5 mg total) by mouth daily.    Dispense:  90 tablet    Refill:  3   HTN: - BP at goal today and home BP readings are at goal as well (<140/90). - Continue checking BP and HR at home and keep a log. - Continue Norvasc. - Encouraged lifestyle changes such as low salt and heart healthy diet. Increase physical activity as tolerated.  - Will continue to monitor.  HDL: - Patient expressed concern of possible side effects from atorvastatin so will discontinue and change to Crestor 5 mg. - Per chart review, hepatic function has remained wnl's. - Will  recheck lipid panel at next OV and check hepatic function to monitor statin therapy. - Will continue to monitor.   Pre-diabetes: - Encouraged patient to continue working on dietary and lifestyle modifications.  - Will check HbA1c at next OV.  Depression: - Mood is stable. - Continue fluoxetine.  - Will continue to monitor.  Right side pain: - Changed statin therapy to see if symptoms improve. - Can use warm compresses (heat) or lidocaine patch to help with the pain. - Will continue to monitor and if symptoms worsen or fail to improve, will consider further evaluation with imaging and/or labs.    Follow-up: Return for in 2 months (June) for CPE and FBW.    Lorrene Reid, PA-C

## 2019-08-07 IMAGING — CR DG CHEST 2V
2 series · 2 of 2 positions shown · non-contrast
Comparison: None.

CLINICAL DATA: Cough, chills, mid chest pain for 2 weeks

EXAM:
CHEST  2 VIEW

[w chest pa]
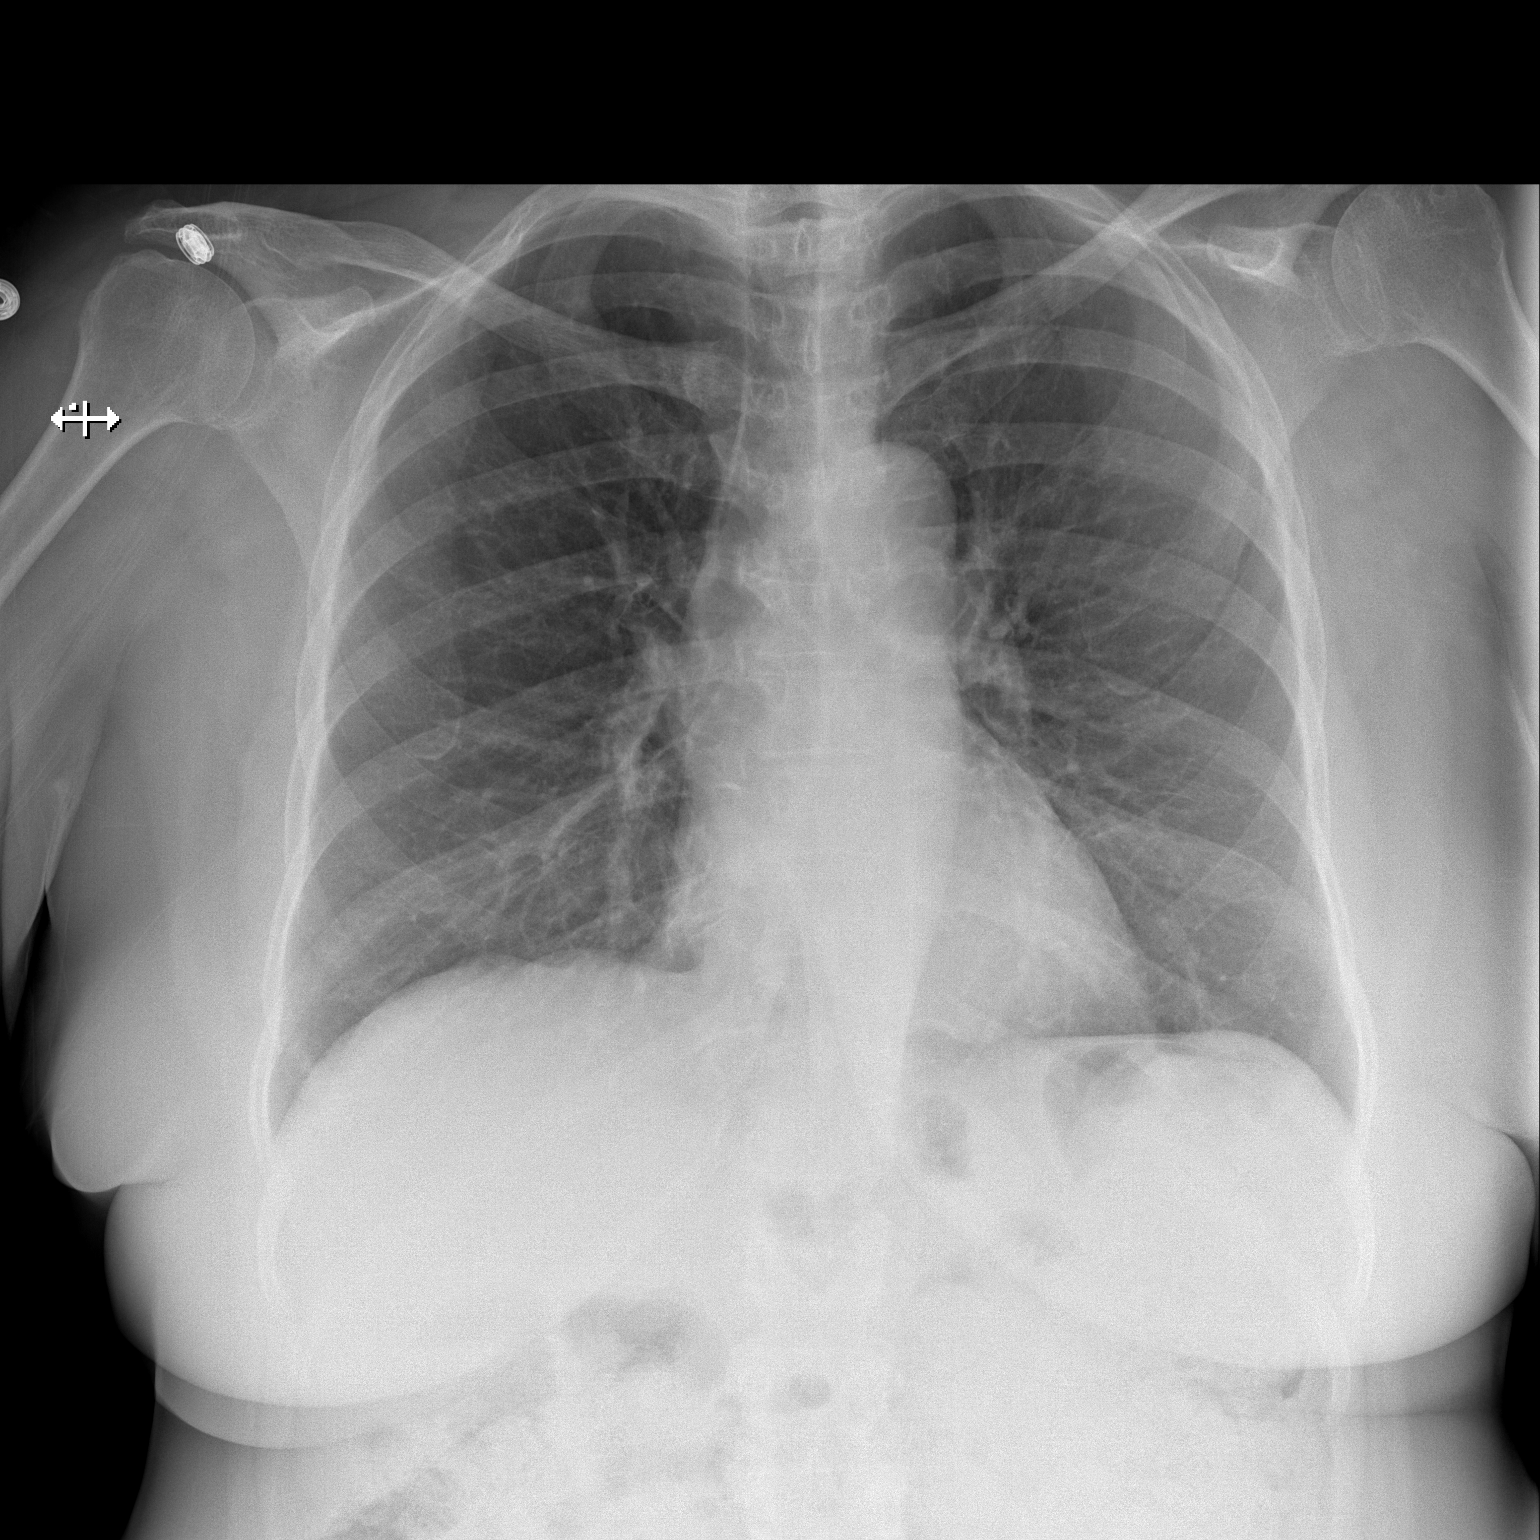

[w chest lat]
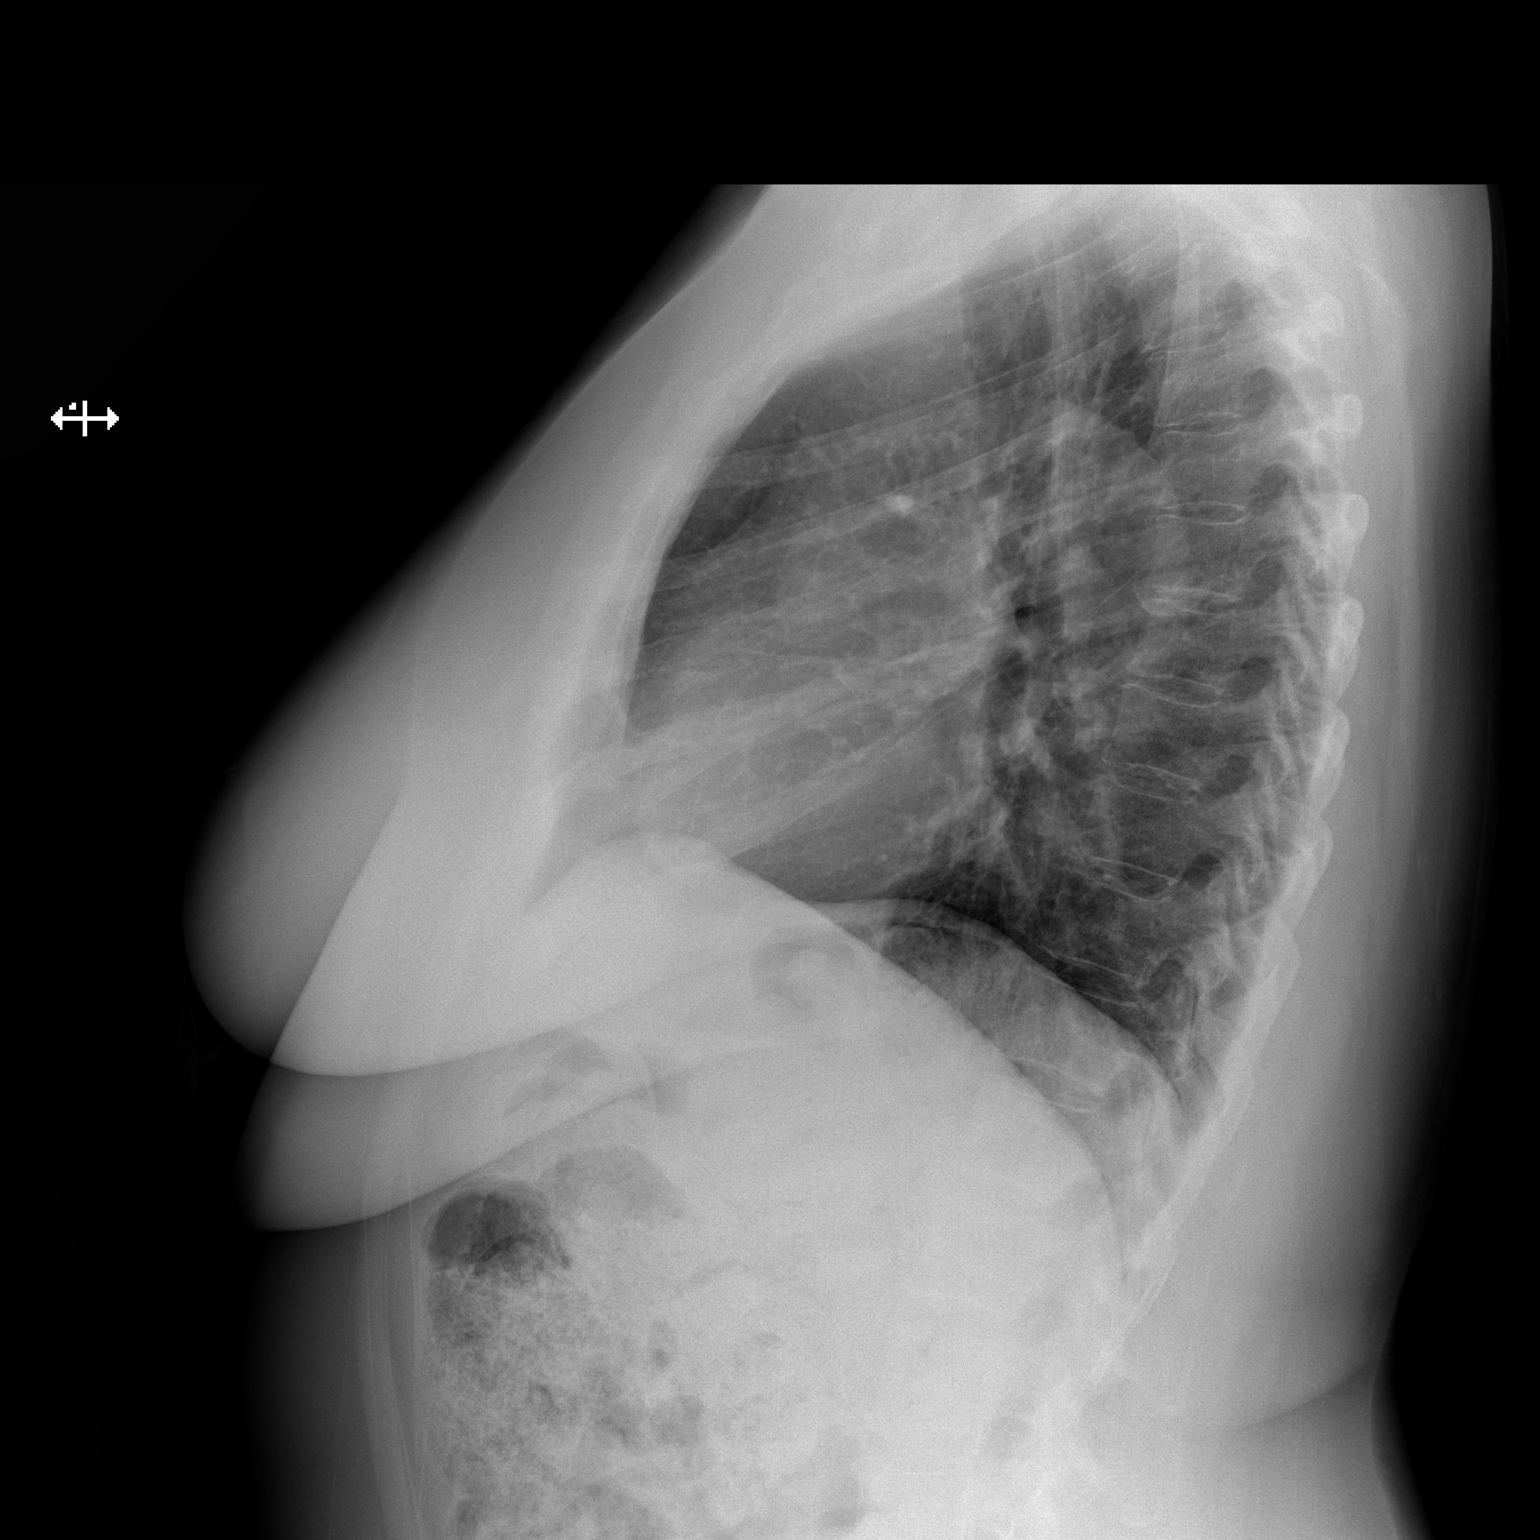

[2 of 2 positions shown; findings below may reference images not displayed]

FINDINGS: The heart size and mediastinal contours are within normal limits.
Both lungs are clear. The visualized skeletal structures are
unremarkable.
IMPRESSION: No active cardiopulmonary disease.

## 2019-08-17 ENCOUNTER — Other Ambulatory Visit: Payer: Self-pay | Admitting: Physician Assistant

## 2019-08-17 ENCOUNTER — Other Ambulatory Visit: Payer: Federal, State, Local not specified - PPO

## 2019-08-17 ENCOUNTER — Other Ambulatory Visit: Payer: Self-pay | Admitting: Family Medicine

## 2019-08-17 ENCOUNTER — Other Ambulatory Visit: Payer: Self-pay

## 2019-08-17 DIAGNOSIS — R739 Hyperglycemia, unspecified: Secondary | ICD-10-CM

## 2019-08-17 DIAGNOSIS — I10 Essential (primary) hypertension: Secondary | ICD-10-CM

## 2019-08-17 DIAGNOSIS — Z0000 Encounter for general adult medical examination without abnormal findings: Secondary | ICD-10-CM

## 2019-08-17 DIAGNOSIS — R7303 Prediabetes: Secondary | ICD-10-CM

## 2019-08-17 DIAGNOSIS — R5383 Other fatigue: Secondary | ICD-10-CM

## 2019-08-17 DIAGNOSIS — E785 Hyperlipidemia, unspecified: Secondary | ICD-10-CM

## 2019-08-18 LAB — COMPREHENSIVE METABOLIC PANEL
ALT: 24 IU/L (ref 0–32)
AST: 26 IU/L (ref 0–40)
Albumin/Globulin Ratio: 1.4 (ref 1.2–2.2)
Albumin: 4 g/dL (ref 3.7–4.7)
Alkaline Phosphatase: 94 IU/L (ref 48–121)
BUN/Creatinine Ratio: 12 (ref 12–28)
BUN: 16 mg/dL (ref 8–27)
Bilirubin Total: 0.3 mg/dL (ref 0.0–1.2)
CO2: 24 mmol/L (ref 20–29)
Calcium: 9.5 mg/dL (ref 8.7–10.3)
Chloride: 101 mmol/L (ref 96–106)
Creatinine, Ser: 1.36 mg/dL — ABNORMAL HIGH (ref 0.57–1.00)
GFR calc Af Amer: 45 mL/min/{1.73_m2} — ABNORMAL LOW (ref >59)
GFR calc non Af Amer: 39 mL/min/{1.73_m2} — ABNORMAL LOW (ref >59)
Globulin, Total: 2.8 g/dL (ref 1.5–4.5)
Glucose: 150 mg/dL — ABNORMAL HIGH (ref 65–99)
Potassium: 4.2 mmol/L (ref 3.5–5.2)
Sodium: 139 mmol/L (ref 134–144)
Total Protein: 6.8 g/dL (ref 6.0–8.5)

## 2019-08-18 LAB — LIPID PANEL
Chol/HDL Ratio: 2.5 ratio (ref 0.0–4.4)
Cholesterol, Total: 127 mg/dL (ref 100–199)
HDL: 51 mg/dL (ref >39)
LDL Chol Calc (NIH): 58 mg/dL (ref 0–99)
Triglycerides: 98 mg/dL (ref 0–149)
VLDL Cholesterol Cal: 18 mg/dL (ref 5–40)

## 2019-08-18 LAB — CBC
Hematocrit: 38.5 % (ref 34.0–46.6)
Hemoglobin: 12 g/dL (ref 11.1–15.9)
MCH: 28.2 pg (ref 26.6–33.0)
MCHC: 31.2 g/dL — ABNORMAL LOW (ref 31.5–35.7)
MCV: 91 fL (ref 79–97)
Platelets: 207 10*3/uL (ref 150–450)
RBC: 4.25 x10E6/uL (ref 3.77–5.28)
RDW: 13.5 % (ref 11.7–15.4)
WBC: 5.6 10*3/uL (ref 3.4–10.8)

## 2019-08-18 LAB — TSH: TSH: 3.29 u[IU]/mL (ref 0.450–4.500)

## 2019-08-18 LAB — HEMOGLOBIN A1C
Est. average glucose Bld gHb Est-mCnc: 140 mg/dL
Hgb A1c MFr Bld: 6.5 % — ABNORMAL HIGH (ref 4.8–5.6)

## 2019-08-21 ENCOUNTER — Other Ambulatory Visit: Payer: Self-pay | Admitting: Family Medicine

## 2019-08-22 ENCOUNTER — Ambulatory Visit (INDEPENDENT_AMBULATORY_CARE_PROVIDER_SITE_OTHER): Payer: Federal, State, Local not specified - PPO | Admitting: Physician Assistant

## 2019-08-22 ENCOUNTER — Other Ambulatory Visit: Payer: Self-pay

## 2019-08-22 ENCOUNTER — Encounter: Payer: Self-pay | Admitting: Physician Assistant

## 2019-08-22 VITALS — BP 106/65 | HR 68 | Temp 98.9°F | Ht 65.0 in | Wt 230.1 lb

## 2019-08-22 DIAGNOSIS — Z0000 Encounter for general adult medical examination without abnormal findings: Secondary | ICD-10-CM | POA: Diagnosis not present

## 2019-08-22 DIAGNOSIS — E785 Hyperlipidemia, unspecified: Secondary | ICD-10-CM

## 2019-08-22 DIAGNOSIS — N1831 Chronic kidney disease, stage 3a: Secondary | ICD-10-CM | POA: Diagnosis not present

## 2019-08-22 DIAGNOSIS — I10 Essential (primary) hypertension: Secondary | ICD-10-CM | POA: Diagnosis not present

## 2019-08-22 DIAGNOSIS — R7303 Prediabetes: Secondary | ICD-10-CM

## 2019-08-22 DIAGNOSIS — Z23 Encounter for immunization: Secondary | ICD-10-CM

## 2019-08-22 DIAGNOSIS — Z1231 Encounter for screening mammogram for malignant neoplasm of breast: Secondary | ICD-10-CM

## 2019-08-22 DIAGNOSIS — Z1159 Encounter for screening for other viral diseases: Secondary | ICD-10-CM

## 2019-08-22 MED ORDER — AMLODIPINE BESYLATE 5 MG PO TABS: ORAL_TABLET | ORAL | 1 refills | Status: DC

## 2019-08-22 NOTE — Progress Notes (Signed)
Female Physical   Impression and Recommendations:    1. Need for Tdap vaccination   2. Encounter for screening mammogram for malignant neoplasm of breast   3. Need for shingles vaccine   4. Need for hepatitis C screening test   5. Hypertension, unspecified type      1) Anticipatory Guidance: Discussed skin CA prevention and sunscreen when outside along with skin surveillance; eating a balanced and modest diet; physical activity at least 25 minutes per day or minimum of 150 min/ week moderate to intense activity. - Pt has increased her physical activity level by walking more and doing some light weight lifting.  2) Immunizations / Screenings / Labs:   All immunizations are up-to-date per recommendations or will be updated today if pt allows.    - Patient understands with dental and vision screens they will schedule independently.  - Obtained CBC, CMP, HgA1c, Lipid panel, and TSH.  - UTD on pneumococcal vaccine. - Placed order for Hep C screening. - Declined HIV screening. - Placed order for second Shingles vaccine, Tdap. - Placed order for screening mammogram.   3) Weight:  BMI meaning discussed with patient.  Discussed goal to improve diet habits to improve overall feelings of well being and objective health data. Improve nutrient density of diet through increasing intake of fruits and vegetables and decreasing saturated fats, white flour products and refined sugars.  4) Healthcare Maintenance: - Discussed lab results. Advised to decrease sweets to help maintain A1c under control and to stay as active as possible, and to avoid NSAIDs to help improve kidney function- creatinine 1.36, GFR 45. - Continue follow-up with specialists: Nephrology, Rheumatology, Gastroenterology - Return in 4-6 weeks for lab visit only to recheck CMP- kidney function.  - Stay well hydrated, at least 64 fl oz - Continue current medication regimen. Provided medication refills.  - Follow heart healthy diet.   - Follow-up in 3-4 months for regular OV- HTN, HLD, Pre-DM, CKD  Meds ordered this encounter  Medications  . amLODipine (NORVASC) 5 MG tablet    Sig: TAKE 1 TABLET(5 MG) BY MOUTH DAILY    Dispense:  90 tablet    Refill:  1    Orders Placed This Encounter  Procedures  . MM Digital Screening  . Tdap vaccine greater than or equal to 7yo IM  . Varicella-zoster vaccine IM  . Hepatitis C Antibody     Return for HTN, Pre-DM, HLD, CKD in 3-4 months.     Gross side effects, risk and benefits, and alternatives of medications discussed with patient.  Patient is aware that all medications have potential side effects and we are unable to predict every side effect or drug-drug interaction that may occur.  Expresses verbal understanding and consents to current therapy plan and treatment regimen.     Please see orders placed and AVS handed out to patient at the end of our visit for further patient instructions/ counseling done pertaining to today's office visit.    Subjective:     CPE HPI: Dawn Cox is a 74 y.o. female who presents to [facility] today for a yearly health maintenance exam.   Health Maintenance Summary  - Reviewed and updated, unless pt declines services.  Last Cologuard or Colonoscopy:  05/08/2014, WNL Family history of Colon CA: No  Tobacco History Reviewed:  Y, never smoker CT scan for screening lung CA: N/A Alcohol and/or drug use:    No concerns; no use Dental Home:Y  Eye exams: Y Dermatology home: N  Female Health:  PAP Smear - last known results: N/A STD concerns:  none Lumps or breast concerns:  None Breast Cancer Family History: Y, cousin Bone/ DEXA scan:  07/27/2017- T score 0.3, WNL   Additional concerns beyond health maintenance issues:  none   Immunization History  Administered Date(s) Administered  . Fluad Quad(high Dose 65+) 11/29/2018  . Influenza, High Dose Seasonal PF 12/21/2016, 12/22/2017  . PFIZER  SARS-COV-2 Vaccination 06/13/2019, 07/14/2019  . Pneumococcal Conjugate-13 03/22/2017  . Pneumococcal Polysaccharide-23 10/04/2011  . Tdap 07/19/2007, 08/22/2019  . Zoster 08/10/2011  . Zoster Recombinat (Shingrix) 08/22/2019     Health Maintenance  Topic Date Due  . MAMMOGRAM  07/28/2019  . INFLUENZA VACCINE  10/21/2019  . COLONOSCOPY  05/08/2024  . TETANUS/TDAP  08/21/2029  . DEXA SCAN  Completed  . COVID-19 Vaccine  Completed  . Hepatitis C Screening  Completed  . PNA vac Low Risk Adult  Completed     Wt Readings from Last 3 Encounters:  08/22/19 230 lb 1.6 oz (104.4 kg)  07/10/19 229 lb 14.4 oz (104.3 kg)  02/28/19 225 lb 3.2 oz (102.2 kg)   BP Readings from Last 3 Encounters:  08/22/19 106/65  07/10/19 112/71  02/28/19 122/64   Pulse Readings from Last 3 Encounters:  08/22/19 68  07/10/19 78  02/28/19 78     Past Medical History:  Diagnosis Date  . Ankle fracture    right  . Anxiety   . Depression   . Diverticulitis   . Hyperlipidemia   . Hypertension   . Lupus (HCC)   . Retinal detachment   . Retinal hemorrhage       Past Surgical History:  Procedure Laterality Date  . ABDOMINAL HYSTERECTOMY    . BREAST BIOPSY    . BREAST SURGERY Right    cyst removal  . CATARACT EXTRACTION Bilateral   . EYE SURGERY Right    hemorrhagia of retina  . HERNIA REPAIR    . RETINAL DETACHMENT SURGERY        Family History  Problem Relation Age of Onset  . Heart attack Mother   . Liver cancer Father   . Depression Daughter        suicide  . Breast cancer Cousin   . Colon cancer Neg Hx   . Stomach cancer Neg Hx   . Esophageal cancer Neg Hx   . Rectal cancer Neg Hx       Social History   Substance and Sexual Activity  Drug Use No  ,   Social History   Substance and Sexual Activity  Alcohol Use Yes   Comment: rarely  ,   Social History   Tobacco Use  Smoking Status Never Smoker  Smokeless Tobacco Never Used  ,   Social History    Substance and Sexual Activity  Sexual Activity Not Currently    Current Outpatient Medications on File Prior to Visit  Medication Sig Dispense Refill  . albuterol (PROVENTIL HFA;VENTOLIN HFA) 108 (90 Base) MCG/ACT inhaler Inhale 1 puff into the lungs every 6 (six) hours as needed for wheezing or shortness of breath.    Marland Kitchen aspirin 325 MG tablet Take 325 mg by mouth every 4 (four) hours as needed.    Marland Kitchen FLUoxetine (PROZAC) 40 MG capsule TAKE 1 CAPSULE(40 MG) BY MOUTH DAILY 90 capsule 0  . glycopyrrolate (ROBINUL) 2 MG tablet Take 1 tablet (2 mg total) by mouth 2 (two) times  daily. 180 tablet 3  . hydroxychloroquine (PLAQUENIL) 200 MG tablet Take 200 mg by mouth 2 (two) times daily.    . Multiple Vitamins-Minerals (HAIR SKIN AND NAILS FORMULA PO) Take 1 tablet by mouth daily.    . rosuvastatin (CRESTOR) 5 MG tablet Take 1 tablet (5 mg total) by mouth daily. 90 tablet 3  . Turmeric 500 MG CAPS Take 1,000 mg by mouth daily.     No current facility-administered medications on file prior to visit.    Allergies: Aloe, Latex, Other, Shellfish allergy, and Vibramycin [doxycycline calcium]  Review of Systems: General:   Denies fever, chills, unexplained weight loss.  Optho/Auditory:   Denies visual changes, blurred vision/LOV Respiratory:   Denies SOB, DOE more than baseline levels.   Cardiovascular:   Denies chest pain, palpitations, new onset peripheral edema  Gastrointestinal:   Denies nausea, vomiting, diarrhea.  Genitourinary: Denies dysuria, freq/ urgency, flank pain or discharge from genitals.  Endocrine:     Denies hot or cold intolerance, polyuria, polydipsia. Musculoskeletal:   Denies unexplained myalgias, joint swelling, unexplained arthralgias, gait problems.  Skin:  Denies rash, suspicious lesions Neurological:     Denies dizziness, unexplained weakness, numbness  Psychiatric/Behavioral:   Denies mood changes, suicidal or homicidal ideations, hallucinations    Objective:     Blood pressure 106/65, pulse 68, temperature 98.9 F (37.2 C), temperature source Oral, height 5\' 5"  (1.651 m), weight 230 lb 1.6 oz (104.4 kg), SpO2 96 %. Body mass index is 38.29 kg/m. General Appearance:    Alert, cooperative, no distress, appears stated age  Head:    Normocephalic, without obvious abnormality, atraumatic  Eyes:    PERRL, conjunctiva/corneas clear, EOM's intact, fundi    benign, both eyes  Ears:    Normal TM's and external ear canals, both ears  Nose:   Nares normal, septum midline, mucosa normal, no drainage    or sinus tenderness  Throat:   Lips w/o lesion, mucosa moist, and tongue normal; teeth and   gums normal  Neck:   Supple, symmetrical, trachea midline, no adenopathy;    thyroid:  no enlargement/tenderness/nodules; no carotid   bruit or JVD  Back:     Symmetric, no curvature, ROM normal, no CVA tenderness  Lungs:     Clear to auscultation bilaterally, respirations unlabored, no       Wh/ R/ R  Chest Wall:    No tenderness or gross deformity; normal excursion   Heart:    Regular rate and rhythm, S1 and S2 normal, no murmur, rub   or gallop  Breast Exam:    No tenderness, masses, or nipple abnormality b/l; no d/c  Abdomen:     Soft, non-tender, bowel sounds active all four quadrants, NO   G/R/R, no masses, no organomegaly  Genitalia:   Declined.  Rectal:   Declined.  Extremities:   Extremities normal, atraumatic, no cyanosis or gross edema  Pulses:   2+ and symmetric all extremities  Skin:   Warm, dry, Skin color, texture, turgor normal, no obvious rashes or lesions Psych: No HI/SI, judgement and insight good, Euthymic mood. Full Affect.  Neurologic:   CNII-XII grossly intact, normal strength, sensation and reflexes throughout

## 2019-08-22 NOTE — Patient Instructions (Signed)

## 2019-08-23 LAB — HEPATITIS C ANTIBODY: Hep C Virus Ab: <0.1 s/co ratio (ref 0.0–0.9)

## 2019-09-13 ENCOUNTER — Other Ambulatory Visit: Payer: Self-pay | Admitting: Family Medicine

## 2019-09-17 ENCOUNTER — Other Ambulatory Visit: Payer: Self-pay | Admitting: Physician Assistant

## 2019-09-17 DIAGNOSIS — N289 Disorder of kidney and ureter, unspecified: Secondary | ICD-10-CM

## 2019-09-19 ENCOUNTER — Other Ambulatory Visit: Payer: Federal, State, Local not specified - PPO

## 2019-09-20 ENCOUNTER — Other Ambulatory Visit: Payer: Federal, State, Local not specified - PPO

## 2019-09-20 ENCOUNTER — Other Ambulatory Visit: Payer: Self-pay

## 2019-09-20 DIAGNOSIS — N289 Disorder of kidney and ureter, unspecified: Secondary | ICD-10-CM

## 2019-09-21 ENCOUNTER — Encounter: Payer: Self-pay | Admitting: Physician Assistant

## 2019-09-21 LAB — COMPREHENSIVE METABOLIC PANEL
ALT: 23 IU/L (ref 0–32)
AST: 26 IU/L (ref 0–40)
Albumin/Globulin Ratio: 1.7 (ref 1.2–2.2)
Albumin: 4.2 g/dL (ref 3.7–4.7)
Alkaline Phosphatase: 98 IU/L (ref 48–121)
BUN/Creatinine Ratio: 12 (ref 12–28)
BUN: 14 mg/dL (ref 8–27)
Bilirubin Total: 0.3 mg/dL (ref 0.0–1.2)
CO2: 24 mmol/L (ref 20–29)
Calcium: 9.1 mg/dL (ref 8.7–10.3)
Chloride: 104 mmol/L (ref 96–106)
Creatinine, Ser: 1.19 mg/dL — ABNORMAL HIGH (ref 0.57–1.00)
GFR calc Af Amer: 52 mL/min/{1.73_m2} — ABNORMAL LOW (ref >59)
GFR calc non Af Amer: 45 mL/min/{1.73_m2} — ABNORMAL LOW (ref >59)
Globulin, Total: 2.5 g/dL (ref 1.5–4.5)
Glucose: 179 mg/dL — ABNORMAL HIGH (ref 65–99)
Potassium: 4.3 mmol/L (ref 3.5–5.2)
Sodium: 143 mmol/L (ref 134–144)
Total Protein: 6.7 g/dL (ref 6.0–8.5)

## 2019-09-28 ENCOUNTER — Other Ambulatory Visit: Payer: Self-pay | Admitting: Physician Assistant

## 2019-10-26 ENCOUNTER — Telehealth: Payer: Self-pay

## 2019-10-26 ENCOUNTER — Encounter: Payer: Self-pay | Admitting: Gastroenterology

## 2019-10-26 ENCOUNTER — Other Ambulatory Visit (INDEPENDENT_AMBULATORY_CARE_PROVIDER_SITE_OTHER): Payer: Federal, State, Local not specified - PPO

## 2019-10-26 DIAGNOSIS — R197 Diarrhea, unspecified: Secondary | ICD-10-CM | POA: Diagnosis not present

## 2019-10-26 LAB — SEDIMENTATION RATE: Sed Rate: 44 mm/hr — ABNORMAL HIGH (ref 0–30)

## 2019-10-26 LAB — BASIC METABOLIC PANEL
BUN: 15 mg/dL (ref 6–23)
CO2: 26 mEq/L (ref 19–32)
Calcium: 9.1 mg/dL (ref 8.4–10.5)
Chloride: 104 mEq/L (ref 96–112)
Creatinine, Ser: 1.35 mg/dL — ABNORMAL HIGH (ref 0.40–1.20)
GFR: 46.43 mL/min — ABNORMAL LOW (ref >60.00)
Glucose, Bld: 120 mg/dL — ABNORMAL HIGH (ref 70–99)
Potassium: 3.7 mEq/L (ref 3.5–5.1)
Sodium: 137 mEq/L (ref 135–145)

## 2019-10-26 LAB — CBC
HCT: 37.5 % (ref 36.0–46.0)
Hemoglobin: 12.3 g/dL (ref 12.0–15.0)
MCHC: 32.7 g/dL (ref 30.0–36.0)
MCV: 89 fl (ref 78.0–100.0)
Platelets: 181 10*3/uL (ref 150.0–400.0)
RBC: 4.22 Mil/uL (ref 3.87–5.11)
RDW: 14 % (ref 11.5–15.5)
WBC: 9.4 10*3/uL (ref 4.0–10.5)

## 2019-10-26 LAB — HIGH SENSITIVITY CRP: CRP, High Sensitivity: 72.37 mg/L — ABNORMAL HIGH (ref 0.000–5.000)

## 2019-10-26 NOTE — Telephone Encounter (Signed)
Orders entered in epic Per Dr Elesa Hacker recommendations. Please see previous note.

## 2019-10-26 NOTE — Progress Notes (Signed)
Called by patient yesterday evening. She has long-standing diarrhea worked up previously and on [employer]. She has dealt with 24 hours of significant diarrhea 7 bowel movements. No blood in stools or fevers. She asked for directions and next steps. I think still culture data and blood work will be helpful and the Dr Loletha Carrow teams can decide where to go from there.  [name]/[name]/[name], please order the following: Stool  culture C. Difficile Ova & Parasite CBC BMP ESR CRP  Patient will come this morning for labs and to give stool study.  In interim overnight given OK to use Imodium. If she felt significantly dehydrated she knew to contact us back and go to UC or ED for further management.  FYI Dr. Karna Dupes

## 2019-10-26 NOTE — Telephone Encounter (Signed)
-----  Message from Irving Copas., MD sent at 10/26/2019  5:45 AM EDT ----- Regarding: Note from yesterday Please see progress note in chart from patient calm last night. Patient coming in today for labs and stool studies. Please place under Dr. Loletha Carrow name. CBC/BMP/ESR/CRP Stool culture/C. Diff/Ova & Parasite  Thanks. GM

## 2019-10-29 ENCOUNTER — Encounter: Payer: Self-pay | Admitting: Physician Assistant

## 2019-10-29 LAB — CLOSTRIDIUM DIFFICILE TOXIN B, QUALITATIVE, REAL-TIME PCR: Toxigenic C. Difficile by PCR: NOT DETECTED

## 2019-11-01 LAB — STOOL CULTURE
MICRO NUMBER:: 10796758
MICRO NUMBER:: 10796759
MICRO NUMBER:: 10796761
SHIGA RESULT:: NOT DETECTED
SPECIMEN QUALITY:: ADEQUATE
SPECIMEN QUALITY:: ADEQUATE
SPECIMEN QUALITY:: ADEQUATE

## 2019-11-01 LAB — OVA AND PARASITE EXAMINATION
CONCENTRATE RESULT:: NONE SEEN
MICRO NUMBER:: 10796760
SPECIMEN QUALITY:: ADEQUATE
TRICHROME RESULT:: NONE SEEN

## 2019-11-29 ENCOUNTER — Other Ambulatory Visit: Payer: Self-pay | Admitting: Physician Assistant

## 2019-11-29 DIAGNOSIS — I10 Essential (primary) hypertension: Secondary | ICD-10-CM

## 2019-12-04 ENCOUNTER — Other Ambulatory Visit: Payer: Self-pay

## 2019-12-04 ENCOUNTER — Ambulatory Visit (INDEPENDENT_AMBULATORY_CARE_PROVIDER_SITE_OTHER): Payer: Federal, State, Local not specified - PPO | Admitting: Physician Assistant

## 2019-12-04 ENCOUNTER — Encounter: Payer: Self-pay | Admitting: Physician Assistant

## 2019-12-04 VITALS — Ht 65.5 in | Wt 218.0 lb

## 2019-12-04 DIAGNOSIS — R7303 Prediabetes: Secondary | ICD-10-CM

## 2019-12-04 DIAGNOSIS — N1831 Chronic kidney disease, stage 3a: Secondary | ICD-10-CM | POA: Diagnosis not present

## 2019-12-04 DIAGNOSIS — F329 Major depressive disorder, single episode, unspecified: Secondary | ICD-10-CM

## 2019-12-04 DIAGNOSIS — E785 Hyperlipidemia, unspecified: Secondary | ICD-10-CM | POA: Diagnosis not present

## 2019-12-04 DIAGNOSIS — I10 Essential (primary) hypertension: Secondary | ICD-10-CM

## 2019-12-04 DIAGNOSIS — M329 Systemic lupus erythematosus, unspecified: Secondary | ICD-10-CM

## 2019-12-04 DIAGNOSIS — F32A Depression, unspecified: Secondary | ICD-10-CM

## 2019-12-04 DIAGNOSIS — J3089 Other allergic rhinitis: Secondary | ICD-10-CM

## 2019-12-04 DIAGNOSIS — E669 Obesity, unspecified: Secondary | ICD-10-CM

## 2019-12-04 NOTE — Progress Notes (Signed)
Telehealth office visit note for Dawn Masker, PA-C- at Primary Care at [facility]   I connected with current patient today by telephone and verified that I am speaking with the correct person   . Location of the patient: Home . Location of the provider: Office - This visit type was conducted due to national recommendations for restrictions regarding the COVID-19 Pandemic (e.g. social distancing) in an effort to limit this patient's exposure and mitigate transmission in our community.    - No physical exam could be performed with this format, beyond that communicated to [place] by the patient/ family members as noted.   - Additionally my office staff/ schedulers were to discuss with the patient that there may be a monetary charge related to this service, depending on their medical insurance.  My understanding is that patient understood and consented to proceed.     _________________________________________________________________________________   History of Present Illness: Pt calls in to follow up on hypertension, hyperlipidemia, prediabetes, and mood management.  HTN: Pt denies new onset chest pain, palpitations, dizziness or leg swelling. Taking medication as directed without side effects. Checks BP at home  and readings range in 120s/80s. Pt follows a low salt diet.  HLD: Pt taking medication as directed without issues. Denies side effects including myalgias and RUQ pain.  Reports she has made some dietary changes by reducing junk food such as chips and is trying to eat more fruit.  Prediabetes: Asymptomatic.  Reports her husband is diabetic, therefore, tries to monitor carbohydrates and sugar.  Mood: Prozac continues to help keep her mood stable.  Has no concerns.  Seasonal allergies: Patient is complaining of sneezing, stuffy nose, and headache for 1 week.  Denies fever, cough, chills, or night sweats.  Reports she has been fully vaccinated against Covid and denies any  exposures.  States she has been staying home.  She has been taking Allegra and using Flonase spray, which has been her regimen for a while.  Obesity: Reports she is trying to lose weight and has made some dietary changes. States she needs to work on being more active.     No flowsheet data found.  Depression screen [facility] 2/9 12/04/2019 08/22/2019 07/10/2019 02/28/2019 11/29/2018  Decreased Interest 0 1 1 1 1   Down, Depressed, Hopeless 0 0 0 2 1  PHQ - 2 Score 0 1 1 3 2   Altered sleeping 0 1 1 1  0  Tired, decreased energy 1 2 2 3 1   Change in appetite 0 1 1 1 1   Feeling bad or failure about yourself  0 1 0 1 0  Trouble concentrating 0 0 1 0 1  Moving slowly or fidgety/restless 0 1 0 0 0  Suicidal thoughts 0 0 0 0 0  PHQ-9 Score 1 7 6 9 5   Difficult doing work/chores Not difficult at all Somewhat difficult Somewhat difficult Not difficult at all Somewhat difficult  Some recent data might be hidden      Impression and Recommendations:     1. Hypertension, unspecified type   2. Hyperlipidemia, unspecified hyperlipidemia type   3. Stage 3a chronic kidney disease   4. Pre-diabetes   5. Depression, unspecified depression type   6. Obesity (BMI 30-39.9)   7. Hx of systemic lupus erythematosus (SLE) (HCC)   8. Environmental and seasonal allergies     Hypertension: -BP stable -Continue current medication regimen. -Follow a low-sodium diet and continue to stay well-hydrated. -Last CMP stable  Hyperlipidemia: -  Last lipid panel WNL -Continue Crestor -Follow a heart healthy diet and increase physical activity as tolerated. -Plan to recheck lipid panel at next OV.  CKD, Stage III: -Followed by nephrology. -Last CMP: Creatinine 1.19, GFR 45 -Recommend to continue to avoid nephrotoxic substances.  Prediabetes: -Last A1c stable -Recommend to continue to monitor carbohydrates and glucose. -Will continue to monitor and plan to recheck A1c at next OV.  Depression: -Stable, PHQ-9 score  of 1 -Continue Prozac -Will continue to monitor  Obesity: -Associated with hypertension, hyperlipidemia, and prediabetes -Praised patient on 12 pound loss since last OV. -Encourage to continue with dietary changes and increase physical activity such as walking. -Recommend to use my fitness pal or lose it app. -Will continue to monitor.  History of SLE: -Followed by rheumatology. -Continue current medication regimen.  Environmental seasonal allergies: -Advised patient to consider changing Allegra to another antihistamine such as Zyrtec or Xyzal, continue Flonase spray, and do nasal rinses before using spray. -Advised to let me know if symptoms fail to improve or worsen and would consider starting antibiotic and also recommend Covid testing.   - As part of my medical decision making, I reviewed the following data within the electronic MEDICAL RECORD NUMBER History obtained from pt /family, CMA notes reviewed and incorporated if applicable, Labs reviewed, Radiograph/ tests reviewed if applicable and OV notes from prior OV's with me, as well as any other specialists she/he has seen since seeing me last, were all reviewed and used in my medical decision making process today.    - Additionally, when appropriate, discussion had with patient regarding our treatment plan, and their biases/concerns about that plan were used in my medical decision making today.    - The patient agreed with the plan and demonstrated an understanding of the instructions.   No barriers to understanding were identified.     - The patient was advised to call back or seek an in-person evaluation if the symptoms worsen or if the condition fails to improve as anticipated.   No follow-ups on file.    No orders of the defined types were placed in this encounter.   No orders of the defined types were placed in this encounter.   There are no discontinued medications.     Time spent on visit including pre-visit chart  review and post-visit care was 18 minutes.      The 21st Century Cures Act was signed into law in 2016 which includes the topic of electronic health records.  This provides immediate access to information in MyChart.  This includes consultation notes, operative notes, office notes, lab results and pathology reports.  If you have any questions about what you read please let us know at your next visit or call us at the office.  We are right here with you.  Note:  This note was prepared with assistance of Dragon voice recognition software. Occasional wrong-word or sound-a-like substitutions may have occurred due to the inherent limitations of voice recognition software.  __________________________________________________________________________________     Patient Care Team    Relationship Specialty Notifications Start End  Dawn Cox, [place] PCP - General Physician Assistant  07/10/19   Crecencio Mc, MD Referring Physician Gastroenterology  05/31/17    Comment: Office address [address] [place], [place] [address]: phone [phone]  Farrel Conners, MD Consulting Physician Nephrology  11/30/18      -Vitals obtained; medications/ allergies reconciled;  personal medical, social, Sx etc.histories were updated by CMA, reviewed by me and  are reflected in chart   Patient Active Problem List   Diagnosis Date Noted  . Dizziness 02/28/2019  . Pre-diabetes 11/29/2018  . Blood glucose elevated 08/29/2018  . Abnormal urinalysis 04/03/2018  . Falls 02/24/2018  . CKD (chronic kidney disease) stage 3, GFR 30-59 ml/min 01/10/2018  . Obesity (BMI 30-39.9) 01/10/2018  . Depression 12/22/2017  . Tremor 11/08/2017  . Perioral dermatitis 06/22/2017  . Diverticulitis 05/25/2017  . Hx of systemic lupus erythematosus (SLE) (HCC) 05/25/2017  . Other fatigue 05/25/2017  . Cardiovascular disease 05/25/2017  . Hyperlipidemia 05/25/2017  . Healthcare maintenance 05/25/2017  . Screening for  breast cancer 05/25/2017  . Body aches 05/25/2017     Current Meds  Medication Sig  . albuterol (PROVENTIL HFA;VENTOLIN HFA) 108 (90 Base) MCG/ACT inhaler Inhale 1 puff into the lungs every 6 (six) hours as needed for wheezing or shortness of breath.  Marland Kitchen amLODipine (NORVASC) 5 MG tablet TAKE 1 TABLET(5 MG) BY MOUTH DAILY  . aspirin 325 MG tablet Take 325 mg by mouth once.   Marland Kitchen FLUoxetine (PROZAC) 40 MG capsule TAKE 1 CAPSULE BY MOUTH DAILY  . glycopyrrolate (ROBINUL) 2 MG tablet Take 1 tablet (2 mg total) by mouth 2 (two) times daily.  . hydroxychloroquine (PLAQUENIL) 200 MG tablet Take 200 mg by mouth 2 (two) times daily.  . Multiple Vitamins-Minerals (HAIR SKIN AND NAILS FORMULA PO) Take 1 tablet by mouth daily.  . rosuvastatin (CRESTOR) 5 MG tablet Take 1 tablet (5 mg total) by mouth daily.  . Turmeric 500 MG CAPS Take 1,000 mg by mouth daily.     Allergies:  Allergies  Allergen Reactions  . Aloe   . Latex   . Other     aryrthromycin- IBS Xray dye  . Shellfish Allergy   . Vibramycin [Doxycycline Calcium]      ROS:  See above HPI for pertinent positives and negatives   Objective:   Height 5' 5.5" (1.664 m), weight 218 lb (98.9 kg).  (if some vitals are omitted, this means that patient was UNABLE to obtain them even though they were asked to get them prior to OV today.  They were asked to call us at their earliest convenience with these once obtained. ) General: A & O * 3; sounds in no acute distress; Respiratory: speaking in full sentences, no conversational dyspnea Psych: insight appears good, mood- appears full

## 2019-12-20 ENCOUNTER — Telehealth: Payer: Self-pay | Admitting: Physician Assistant

## 2019-12-20 ENCOUNTER — Other Ambulatory Visit: Payer: Self-pay

## 2019-12-20 ENCOUNTER — Ambulatory Visit
Admission: EM | Admit: 2019-12-20 | Discharge: 2019-12-20 | Disposition: A | Discharge status: Home / Self Care | Payer: Federal, State, Local not specified - PPO | Attending: Emergency Medicine | Admitting: Emergency Medicine

## 2019-12-20 DIAGNOSIS — R509 Fever, unspecified: Secondary | ICD-10-CM

## 2019-12-20 DIAGNOSIS — R0981 Nasal congestion: Secondary | ICD-10-CM

## 2019-12-20 DIAGNOSIS — Z1152 Encounter for screening for COVID-19: Secondary | ICD-10-CM | POA: Diagnosis not present

## 2019-12-20 NOTE — Discharge Instructions (Addendum)
Your COVID test is pending - it is important to quarantine / isolate at home until your results are back. °If you test positive and would like further evaluation for persistent or worsening symptoms, you may schedule an E-visit or virtual (video) visit throughout the [facility] MyChart app or website. ° °PLEASE NOTE: If you develop severe chest pain or shortness of breath please go to the ER or call 9-1-1 for further evaluation --> DO NOT schedule electronic or virtual visits for this. °Please call our office for further guidance / recommendations as needed. ° °For information about the Covid vaccine, please visit [email or web address] °

## 2019-12-20 NOTE — ED Provider Notes (Signed)
[facility]    CSN: [id number] Arrival date & time: 12/20/19  1631      History   Chief Complaint Chief Complaint  Patient presents with  . Fever    HPI Geetika Laborde is a 74 y.o. female  Presenting for Covid testing.  Patient refers history: Endorsing subjective fever, chills, myalgias, dry cough and nasal congestion for the last 3 days.  No difficulty breathing or chest pain, change in appetite or activity level, vomiting or diarrhea.  Called her PCP who recommended she present to urgent care for Covid testing.  Past Medical History:  Diagnosis Date  . Ankle fracture    right  . Anxiety   . Depression   . Diverticulitis   . Hyperlipidemia   . Hypertension   . Lupus (HCC)   . Retinal detachment   . Retinal hemorrhage     Patient Active Problem List   Diagnosis Date Noted  . Dizziness 02/28/2019  . Pre-diabetes 11/29/2018  . Blood glucose elevated 08/29/2018  . Abnormal urinalysis 04/03/2018  . Falls 02/24/2018  . CKD (chronic kidney disease) stage 3, GFR 30-59 ml/min 01/10/2018  . Obesity (BMI 30-39.9) 01/10/2018  . Depression 12/22/2017  . Tremor 11/08/2017  . Perioral dermatitis 06/22/2017  . Diverticulitis 05/25/2017  . Hx of systemic lupus erythematosus (SLE) (HCC) 05/25/2017  . Other fatigue 05/25/2017  . Cardiovascular disease 05/25/2017  . Hyperlipidemia 05/25/2017  . Healthcare maintenance 05/25/2017  . Screening for breast cancer 05/25/2017  . Body aches 05/25/2017    Past Surgical History:  Procedure Laterality Date  . ABDOMINAL HYSTERECTOMY    . BREAST BIOPSY    . BREAST SURGERY Right    cyst removal  . CATARACT EXTRACTION Bilateral   . EYE SURGERY Right    hemorrhagia of retina  . HERNIA REPAIR    . RETINAL DETACHMENT SURGERY      OB History   No obstetric history on file.      Home Medications    Prior to Admission medications   Medication Sig Start Date End Date Taking? Authorizing Provider  albuterol (PROVENTIL  HFA;VENTOLIN HFA) 108 (90 Base) MCG/ACT inhaler Inhale 1 puff into the lungs every 6 (six) hours as needed for wheezing or shortness of breath.    [provider]  amLODipine (NORVASC) 5 MG tablet TAKE 1 TABLET(5 MG) BY MOUTH DAILY 11/29/19   Mayer Masker, PA-C  aspirin 325 MG tablet Take 325 mg by mouth once.     [provider]  FLUoxetine (PROZAC) 40 MG capsule TAKE 1 CAPSULE BY MOUTH DAILY 10/01/19   Mayer Masker, PA-C  glycopyrrolate (ROBINUL) 2 MG tablet Take 1 tablet (2 mg total) by mouth 2 (two) times daily. 01/30/19   Sherrilyn Rist, MD  hydroxychloroquine (PLAQUENIL) 200 MG tablet Take 200 mg by mouth 2 (two) times daily.    [provider]  Multiple Vitamins-Minerals (HAIR SKIN AND NAILS FORMULA PO) Take 1 tablet by mouth daily.    [provider]  rosuvastatin (CRESTOR) 5 MG tablet Take 1 tablet (5 mg total) by mouth daily. 07/10/19   Mayer Masker, PA-C  Turmeric 500 MG CAPS Take 1,000 mg by mouth daily.    [provider]    Family History Family History  Problem Relation Age of Onset  . Heart attack Mother   . Liver cancer Father   . Depression Daughter        suicide  . Breast cancer Cousin   .  Colon cancer Neg Hx   . Stomach cancer Neg Hx   . Esophageal cancer Neg Hx   . Rectal cancer Neg Hx     Social History Social History   Tobacco Use  . Smoking status: Never Smoker  . Smokeless tobacco: Never Used  Vaping Use  . Vaping Use: Never used  Substance Use Topics  . Alcohol use: Yes    Comment: rarely  . Drug use: No     Allergies   Aloe, Latex, Other, Shellfish allergy, and Vibramycin [doxycycline calcium]   Review of Systems As per HPI   Physical Exam Triage Vital Signs ED Triage Vitals  Enc Vitals Group     BP      Pulse      Resp      Temp      Temp src      SpO2      Weight      Height      Head Circumference      Peak Flow      Pain Score      Pain Loc      Pain Edu?      Excl.  in GC?    No data found.  Updated Vital Signs BP 135/75 (BP Location: Left Arm)   Pulse 97   Temp 100.1 F (37.8 C) (Oral)   Resp 18   SpO2 97%   Visual Acuity Right Eye Distance:   Left Eye Distance:   Bilateral Distance:    Right Eye Near:   Left Eye Near:    Bilateral Near:     Physical Exam Constitutional:      General: She is not in acute distress.    Appearance: She is not ill-appearing or diaphoretic.  HENT:     Head: Normocephalic and atraumatic.     Mouth/Throat:     Mouth: Mucous membranes are moist.     Pharynx: Oropharynx is clear. No oropharyngeal exudate or posterior oropharyngeal erythema.  Eyes:     General: No scleral icterus.    Conjunctiva/sclera: Conjunctivae normal.     Pupils: Pupils are equal, round, and reactive to light.  Neck:     Comments: Trachea midline, negative JVD Cardiovascular:     Rate and Rhythm: Normal rate and regular rhythm.     Heart sounds: No murmur heard.  No gallop.   Pulmonary:     Effort: Pulmonary effort is normal. No respiratory distress.     Breath sounds: No wheezing, rhonchi or rales.  Musculoskeletal:     Cervical back: Neck supple. No tenderness.  Lymphadenopathy:     Cervical: No cervical adenopathy.  Skin:    Capillary Refill: Capillary refill takes less than 2 seconds.     Coloration: Skin is not jaundiced or pale.     Findings: No rash.  Neurological:     General: No focal deficit present.     Mental Status: She is alert and oriented to person, place, and time.      UC Treatments / Results  Labs (all labs ordered are listed, but only abnormal results are displayed) Labs Reviewed  NOVEL CORONAVIRUS, NAA    EKG   Radiology No results found.  Procedures Procedures (including critical care time)  Medications Ordered in UC Medications - No data to display  Initial Impression / Assessment and Plan / UC Course  I have reviewed the triage vital signs and the nursing notes.  Pertinent labs  & imaging results  that were available during my care of the patient were reviewed by me and considered in my medical decision making (see chart for details).     Patient afebrile, nontoxic, with SpO2 97%.  Covid PCR pending.  Patient to quarantine until results are back.  We will treat supportively as outlined below.  Return precautions discussed, patient verbalized understanding and is agreeable to plan. Final Clinical Impressions(s) / UC Diagnoses   Final diagnoses:  Encounter for screening for COVID-19  Fever, unspecified  Nasal congestion     Discharge Instructions     Your COVID test is pending - it is important to quarantine / isolate at home until your results are back. If you test positive and would like further evaluation for persistent or worsening symptoms, you may schedule an E-visit or virtual (video) visit throughout the [facility] app or website.  PLEASE NOTE: If you develop severe chest pain or shortness of breath please go to the ER or call 9-1-1 for further evaluation --> DO NOT schedule electronic or virtual visits for this. Please call our office for further guidance / recommendations as needed.  For information about the Covid vaccine, please visit [email or web address]    ED Prescriptions    None     PDMP not reviewed this encounter.   Hall-Potvin, [place], [place] 12/20/19 1722

## 2019-12-20 NOTE — Telephone Encounter (Signed)
Patient states she has body aches, chills, fatigue, nasal congestion.   I advised patient these were not allergy symptoms and suggested she get covid tested or go to UC for evaluation. Patient said she would do that tomorrow but doesn't feel like doing anything today.  I advised patient to push fluids, get plenty of rest and to monitor symptoms. AS, CMA

## 2019-12-20 NOTE — ED Triage Notes (Signed)
Pt c/o fever, chills, body aches, slight cough, and nasal congestion x3 nights. PCP sent here for covid testing.

## 2019-12-20 NOTE — Telephone Encounter (Signed)
Patient called in stating symptoms achy and chills and warmness. Can you advise patient on meds or treatments. Monolukast? Husband is taking that. Thanks

## 2019-12-21 ENCOUNTER — Encounter: Payer: Self-pay | Admitting: Physician Assistant

## 2019-12-21 LAB — NOVEL CORONAVIRUS, NAA: SARS-CoV-2, NAA: DETECTED — AB

## 2019-12-21 LAB — SARS-COV-2, NAA 2 DAY TAT

## 2019-12-22 ENCOUNTER — Other Ambulatory Visit: Payer: Self-pay | Admitting: Adult Health

## 2019-12-22 DIAGNOSIS — U071 COVID-19: Secondary | ICD-10-CM

## 2019-12-22 NOTE — Progress Notes (Signed)
I connected by phone with Dawn Cox on 12/22/2019 at 10:27 AM to discuss the potential use of a new treatment for mild to moderate COVID-19 viral infection in non-hospitalized patients.  This patient is a 74 y.o. female that meets the FDA criteria for Emergency Use Authorization of COVID monoclonal antibody casirivimab/imdevimab or bamlanivimab/eteseviamb.  Has a (+) direct SARS-CoV-2 viral test result  Has mild or moderate COVID-19   Is NOT hospitalized due to COVID-19  Is within 10 days of symptom onset  Has at least one of the high risk factor(s) for progression to severe COVID-19 and/or hospitalization as defined in EUA.  Specific high risk criteria : Older age (>/= 73 yo)   I have spoken and communicated the following to the patient or parent/caregiver regarding COVID monoclonal antibody treatment:  1. FDA has authorized the emergency use for the treatment of mild to moderate COVID-19 in adults and pediatric patients with positive results of direct SARS-CoV-2 viral testing who are 39 years of age and older weighing at least 40 kg, and who are at high risk for progressing to severe COVID-19 and/or hospitalization.  2. The significant known and potential risks and benefits of COVID monoclonal antibody, and the extent to which such potential risks and benefits are unknown.  3. Information on available alternative treatments and the risks and benefits of those alternatives, including clinical trials.  4. Patients treated with COVID monoclonal antibody should continue to self-isolate and use infection control measures (e.g., wear mask, isolate, social distance, avoid sharing personal items, clean and disinfect "high touch" surfaces, and frequent handwashing) according to CDC guidelines.   5. The patient or parent/caregiver has the option to accept or refuse COVID monoclonal antibody treatment.  After reviewing this information with the patient, the patient has agreed to receive one of  the available covid 19 monoclonal antibodies and will be provided an appropriate fact sheet prior to infusion.  Set up 10/3 at [facility], NP 12/22/2019 10:27 AM

## 2019-12-23 ENCOUNTER — Ambulatory Visit (HOSPITAL_COMMUNITY)
Admission: RE | Admit: 2019-12-23 | Discharge: 2019-12-23 | Disposition: A | Discharge status: Home / Self Care | Payer: Federal, State, Local not specified - PPO | Source: Ambulatory Visit | Attending: Pulmonary Disease | Admitting: Pulmonary Disease

## 2019-12-23 DIAGNOSIS — U071 COVID-19: Secondary | ICD-10-CM | POA: Diagnosis not present

## 2019-12-23 MED ORDER — METHYLPREDNISOLONE SODIUM SUCC 125 MG IJ SOLR: 125.0000 mg | Freq: Once | INTRAMUSCULAR | Status: DC | PRN

## 2019-12-23 MED ORDER — FAMOTIDINE IN NACL 20-0.9 MG/50ML-% IV SOLN: 20.0000 mg | Freq: Once | INTRAVENOUS | Status: DC | PRN

## 2019-12-23 MED ORDER — ALBUTEROL SULFATE HFA 108 (90 BASE) MCG/ACT IN AERS: 2.0000 | INHALATION_SPRAY | Freq: Once | RESPIRATORY_TRACT | Status: DC | PRN

## 2019-12-23 MED ORDER — SODIUM CHLORIDE 0.9 % IV SOLN: INTRAVENOUS | Status: DC | PRN

## 2019-12-23 MED ORDER — SODIUM CHLORIDE 0.9 % IV SOLN
1200.0000 mg | Freq: Once | INTRAVENOUS | Status: AC
  Administered 2019-12-23: 1200 mg via INTRAVENOUS

## 2019-12-23 MED ORDER — DIPHENHYDRAMINE HCL 50 MG/ML IJ SOLN: 50.0000 mg | Freq: Once | INTRAMUSCULAR | Status: DC | PRN

## 2019-12-23 MED ORDER — EPINEPHRINE 0.3 MG/0.3ML IJ SOAJ: 0.3000 mg | Freq: Once | INTRAMUSCULAR | Status: DC | PRN

## 2019-12-23 NOTE — Discharge Instructions (Signed)

## 2019-12-23 NOTE — Progress Notes (Signed)
  Diagnosis: COVID-19  Physician: Dr. Wright   Procedure: Covid Infusion Clinic Med: casirivimab\imdevimab infusion - Provided patient with casirivimab\imdevimab fact sheet for patients, parents and caregivers prior to infusion.  Complications: No immediate complications noted.  Discharge: Discharged home   Dawn Fatica  Cox 12/23/2019   

## 2019-12-27 ENCOUNTER — Other Ambulatory Visit: Payer: Self-pay | Admitting: Physician Assistant

## 2020-01-02 ENCOUNTER — Other Ambulatory Visit: Payer: Self-pay | Admitting: Physician Assistant

## 2020-01-17 ENCOUNTER — Other Ambulatory Visit: Payer: Self-pay | Admitting: Gastroenterology

## 2020-02-05 ENCOUNTER — Telehealth: Payer: Self-pay | Admitting: Physician Assistant

## 2020-02-05 NOTE — Telephone Encounter (Signed)
Patient scheduled follow up apt with [facility] in January for HTN, HLD with labs a few days before.   Patient states she only has some nasal congestion and is using a nasal spray and allegra but doesn't think she needs medicine or an apt to follow up. AS< CMA

## 2020-02-05 NOTE — Telephone Encounter (Signed)
Patient is having lingering covid symptoms and I advised her our appointments are booked and it will be December. Can you please call her and offer her any advice. Thanks

## 2020-04-01 ENCOUNTER — Other Ambulatory Visit: Payer: Self-pay | Admitting: Physician Assistant

## 2020-04-03 ENCOUNTER — Other Ambulatory Visit: Payer: Self-pay | Admitting: Physician Assistant

## 2020-04-04 ENCOUNTER — Other Ambulatory Visit: Payer: Self-pay | Admitting: Physician Assistant

## 2020-04-04 DIAGNOSIS — R7303 Prediabetes: Secondary | ICD-10-CM

## 2020-04-04 DIAGNOSIS — I10 Essential (primary) hypertension: Secondary | ICD-10-CM

## 2020-04-04 DIAGNOSIS — E785 Hyperlipidemia, unspecified: Secondary | ICD-10-CM

## 2020-04-10 ENCOUNTER — Other Ambulatory Visit: Payer: Self-pay

## 2020-04-10 ENCOUNTER — Other Ambulatory Visit: Payer: Federal, State, Local not specified - PPO

## 2020-04-10 DIAGNOSIS — R7303 Prediabetes: Secondary | ICD-10-CM

## 2020-04-10 DIAGNOSIS — E785 Hyperlipidemia, unspecified: Secondary | ICD-10-CM

## 2020-04-10 DIAGNOSIS — I10 Essential (primary) hypertension: Secondary | ICD-10-CM

## 2020-04-11 ENCOUNTER — Other Ambulatory Visit: Payer: Federal, State, Local not specified - PPO

## 2020-04-11 LAB — COMPREHENSIVE METABOLIC PANEL
ALT: 22 IU/L (ref 0–32)
AST: 28 IU/L (ref 0–40)
Albumin/Globulin Ratio: 1.3 (ref 1.2–2.2)
Albumin: 3.9 g/dL (ref 3.7–4.7)
Alkaline Phosphatase: 83 IU/L (ref 44–121)
BUN/Creatinine Ratio: 12 (ref 12–28)
BUN: 15 mg/dL (ref 8–27)
Bilirubin Total: 0.2 mg/dL (ref 0.0–1.2)
CO2: 23 mmol/L (ref 20–29)
Calcium: 9.1 mg/dL (ref 8.7–10.3)
Chloride: 104 mmol/L (ref 96–106)
Creatinine, Ser: 1.22 mg/dL — ABNORMAL HIGH (ref 0.57–1.00)
GFR calc Af Amer: 50 mL/min/{1.73_m2} — ABNORMAL LOW (ref >59)
GFR calc non Af Amer: 44 mL/min/{1.73_m2} — ABNORMAL LOW (ref >59)
Globulin, Total: 3.1 g/dL (ref 1.5–4.5)
Glucose: 133 mg/dL — ABNORMAL HIGH (ref 65–99)
Potassium: 3.9 mmol/L (ref 3.5–5.2)
Sodium: 140 mmol/L (ref 134–144)
Total Protein: 7 g/dL (ref 6.0–8.5)

## 2020-04-11 LAB — LIPID PANEL
Chol/HDL Ratio: 2.5 ratio (ref 0.0–4.4)
Cholesterol, Total: 136 mg/dL (ref 100–199)
HDL: 55 mg/dL (ref >39)
LDL Chol Calc (NIH): 66 mg/dL (ref 0–99)
Triglycerides: 75 mg/dL (ref 0–149)
VLDL Cholesterol Cal: 15 mg/dL (ref 5–40)

## 2020-04-11 LAB — HEMOGLOBIN A1C
Est. average glucose Bld gHb Est-mCnc: 128 mg/dL
Hgb A1c MFr Bld: 6.1 % — ABNORMAL HIGH (ref 4.8–5.6)

## 2020-04-17 ENCOUNTER — Telehealth: Payer: Self-pay | Admitting: Physician Assistant

## 2020-04-17 NOTE — Telephone Encounter (Signed)
Labs from the 20 th of January faxed to kidney doctor through [facility].

## 2020-04-18 ENCOUNTER — Ambulatory Visit (INDEPENDENT_AMBULATORY_CARE_PROVIDER_SITE_OTHER): Payer: Federal, State, Local not specified - PPO | Admitting: Physician Assistant

## 2020-04-18 ENCOUNTER — Telehealth: Payer: Self-pay | Admitting: Physician Assistant

## 2020-04-18 ENCOUNTER — Encounter: Payer: Self-pay | Admitting: Physician Assistant

## 2020-04-18 VITALS — Ht 65.5 in | Wt 217.0 lb

## 2020-04-18 DIAGNOSIS — I10 Essential (primary) hypertension: Secondary | ICD-10-CM | POA: Diagnosis not present

## 2020-04-18 DIAGNOSIS — R7303 Prediabetes: Secondary | ICD-10-CM | POA: Diagnosis not present

## 2020-04-18 DIAGNOSIS — J069 Acute upper respiratory infection, unspecified: Secondary | ICD-10-CM

## 2020-04-18 DIAGNOSIS — G4700 Insomnia, unspecified: Secondary | ICD-10-CM

## 2020-04-18 DIAGNOSIS — N1831 Chronic kidney disease, stage 3a: Secondary | ICD-10-CM | POA: Diagnosis not present

## 2020-04-18 DIAGNOSIS — E785 Hyperlipidemia, unspecified: Secondary | ICD-10-CM

## 2020-04-18 MED ORDER — ROSUVASTATIN CALCIUM 5 MG PO TABS: 5.0000 mg | ORAL_TABLET | Freq: Every day | ORAL | 3 refills | Status: DC

## 2020-04-18 MED ORDER — TRAZODONE HCL 50 MG PO TABS: 25.0000 mg | ORAL_TABLET | Freq: Every evening | ORAL | 1 refills | Status: DC | PRN

## 2020-04-18 NOTE — Patient Instructions (Signed)
Chronic Kidney Disease, Adult Chronic kidney disease is when lasting damage happens to the kidneys slowly over a long time. The kidneys help to:  Make pee (urine).  Make hormones.  Keep the right amount of fluids and chemicals in the body. Most often, this disease does not go away. You must take steps to help keep the kidney damage from getting worse. If steps are not taken, the kidneys might stop working forever. What are the causes?  Diabetes.  High blood pressure.  Diseases that affect the heart and blood vessels.  Other kidney diseases.  Diseases of the body's disease-fighting system.  A problem with the flow of pee.  Infections of the organs that make pee, store it, and take it out of the body.  Swelling or irritation of your blood vessels. What increases the risk?  Getting older.  Having someone in your family who has kidney disease or kidney failure.  Having a disease caused by genes.  Taking medicines often that harm the kidneys.  Being near or having contact with harmful substances.  Being very overweight.  Using tobacco now or in the past. What are the signs or symptoms?  Feeling very tired.  Having a swollen face, legs, ankles, or feet.  Feeling like you may vomit or vomiting.  Not feeling hungry.  Being confused or not able to focus.  Twitches and cramps in the leg muscles or other muscles.  Dry, itchy skin.  A taste of metal in your mouth.  Making less pee, or making more pee.  Shortness of breath.  Trouble sleeping. You may also become anemic or get weak bones. Anemic means there is not enough red blood cells or hemoglobin in your blood. You may get symptoms slowly. You may not notice them until the kidney damage gets very bad. How is this treated? Often, there is no cure for this disease. Treatment can help with symptoms and help keep the disease from getting worse. You may need to:  Avoid alcohol.  Avoid foods that are high in  salt, potassium, phosphorous, and protein.  Take medicines for symptoms and to help control other conditions.  Have dialysis. This treatment gets harmful waste out of your body.  Treat other problems that cause your kidney disease or make it worse. Follow these instructions at home: Medicines  Take over-the-counter and prescription medicines only as told by your doctor.  Do not take any new medicines, vitamins, or supplements unless your doctor says it is okay. Lifestyle  Do not smoke or use any products that contain nicotine or tobacco. If you need help quitting, ask your doctor.  If you drink alcohol: ? Limit how much you use to:  0-1 drink a day for women who are not pregnant.  0-2 drinks a day for men. ? Know how much alcohol is in your drink. In the [place] one drink equals one 12 oz bottle of beer (355 mL), one 5 oz glass of wine (148 mL), or one 1 oz glass of hard liquor (44 mL).  Stay at a healthy weight. If you need help losing weight, ask your doctor.   General instructions  Follow instructions from your doctor about what you cannot eat or drink.  Track your blood pressure at home. Tell your doctor about any changes.  If you have diabetes, track your blood sugar.  Exercise at least 30 minutes a day, 5 days a week.  Keep your shots (vaccinations) up to date.  Keep all follow-up visits.     Where to find more information  American Association of Kidney Patients: www.aakp.org  National Kidney Foundation: www.kidney.org  American Kidney Fund: www.akfinc.org  Life Options: www.lifeoptions.org  Kidney School: www.kidneyschool.org Contact a doctor if:  Your symptoms get worse.  You get new symptoms. Get help right away if:  You get symptoms of end-stage kidney disease. These include: ? Headaches. ? Losing feeling in your hands or feet. ? Easy bruising. ? Having hiccups often. ? Chest pain. ? Shortness of breath. ? Lack of menstrual periods, in  women.  You have a fever.  You make less pee than normal.  You have pain or you bleed when you pee or poop. These symptoms may be an emergency. Get help right away. Call your local emergency services (911 in the [place]  Do not wait to see if the symptoms will go away.  Do not drive yourself to the hospital. Summary  Chronic kidney disease is when lasting damage happens to the kidneys slowly over a long time.  Causes of this disease include diabetes and high blood pressure.  Often, there is no cure for this disease. Treatment can help symptoms and help keep the disease from getting worse.  Treatment may involve lifestyle changes, medicines, and dialysis. This information is not intended to replace advice given to you by your health care provider. Make sure you discuss any questions you have with your health care provider. Document Revised: 06/13/2019 Document Reviewed: 06/13/2019 Elsevier Patient Education  2021 Elsevier Inc.  

## 2020-04-18 NOTE — Progress Notes (Signed)
Telehealth office visit note for Dawn Masker, PA-C- at Primary Care at [facility]   I connected with current patient today by telephone and verified that I am speaking with the correct person   . Location of the patient: Home . Location of the provider: Office - This visit type was conducted due to national recommendations for restrictions regarding the COVID-19 Pandemic (e.g. social distancing) in an effort to limit this patient's exposure and mitigate transmission in our community.    - No physical exam could be performed with this format, beyond that communicated to [place] by the patient/ family members as noted.   - Additionally my office staff/ schedulers were to discuss with the patient that there may be a monetary charge related to this service, depending on their medical insurance.  My understanding is that patient understood and consented to proceed.     _________________________________________________________________________________   History of Present Illness: Patient calls in to follow up on diabetes mellitus, hypertension and hyperlipidemia.  Has complaints of sneezing, nasal congestion, mild headache, intermittent dry cough, and woke up this morning with eyes mildly swollen underneath. Symptoms started yesterday. Denies facial pain, vision changes, fever, shortness of breath, sore throat, sick contact exposures, or chills. Has been taking Allegra and drinking lemon tea. Is fully vaccinated including booster against Covid-19. UTD on influenza. Also has complaints of not being able to fall asleep for the past 3 weeks. Limits caffeine to one soda/day. Does not drink coffee.  Prediabetes: Pt denies increased urination or thirst. Pt has reduced carbohydrates and candy bars.  HTN: Pt denies chest pain, palpitations, dizziness or lower extremity swelling. Taking medication as directed without side effects. Checks BP at home sometimes, BP readings 125/70s.  HLD: Pt taking  medication as directed without issues. Denies side effects including myalgias and RUQ pain. Has reduced fried chicken and increased fruits and greens.       No flowsheet data found.  Depression screen [facility] 2/9 04/18/2020 12/04/2019 08/22/2019 07/10/2019 02/28/2019  Decreased Interest 1 0 1 1 1   Down, Depressed, Hopeless 0 0 0 0 2  PHQ - 2 Score 1 0 1 1 3   Altered sleeping 1 0 1 1 1   Tired, decreased energy 1 1 2 2 3   Change in appetite 0 0 1 1 1   Feeling bad or failure about yourself  0 0 1 0 1  Trouble concentrating 0 0 0 1 0  Moving slowly or fidgety/restless 0 0 1 0 0  Suicidal thoughts 0 0 0 0 0  PHQ-9 Score 3 1 7 6 9   Difficult doing work/chores Somewhat difficult Not difficult at all Somewhat difficult Somewhat difficult Not difficult at all  Some recent data might be hidden      Impression and Recommendations:     1. Stage 3a chronic kidney disease (HCC)   2. Hyperlipidemia, unspecified hyperlipidemia type   3. Hypertension, unspecified type   4. Pre-diabetes   5. Insomnia, unspecified type   6. Upper respiratory tract infection, unspecified type     Stage IIIa chronic kidney disease: -Recent CMP: Serum creatinine 1.22, GFR 50 stable -Avoid NSAIDs. -Will continue to monitor.  Hyperlipidemia, unspecified hyperlipidemia type: -Recent lipid panel WNL, LDL 66 -Continue current medication regimen. -Continue with dietary changes and reduce saturated and transfats. -Will continue to monitor.  Hypertension, unspecified type: -Unable to obtain BP today. -Continue current medication regimen. -Will continue to monitor.  Prediabetes: -Most recent A1c 6.1, improved from 6.5. -Continue  low carbohydrate and glucose diet. -Will continue to monitor.  Insomnia, unspecified type: -Recommend to avoid caffeinated beverages and establish a good sleep hygiene. -Discussed with patient management options and will send temporary rx for Trazodone to help with sleep.  Upper  respiratory tract infection, unspecified type -Discussed with patient recommend COVID-19 testing and provided information on available testing sites. -Continue with home supportive care, recommend to take a decongestant for 1-2 days and monitor blood pressure/pulse, get plenty of rest, stay hydrated, drink warm liquids/honey and use a humidifier.  -Recommend to follow latest CDC quarantine guidelines until Covid tested and results obtained. -Monitor symptoms and discussed red flag s/sxs to monitor for such as shortness of breath, chest pain, confusion. -Patient has history of Covid-19 infection.       - As part of my medical decision making, I reviewed the following data within the electronic MEDICAL RECORD NUMBER History obtained from pt /family, CMA notes reviewed and incorporated if applicable, Labs reviewed, Radiograph/ tests reviewed if applicable and OV notes from prior OV's with me, as well as any other specialists she/he has seen since seeing me last, were all reviewed and used in my medical decision making process today.    - Additionally, when appropriate, discussion had with patient regarding our treatment plan, and their biases/concerns about that plan were used in my medical decision making today.    - The patient agreed with the plan and demonstrated an understanding of the instructions.   No barriers to understanding were identified.     - The patient was advised to call back or seek an in-person evaluation if the symptoms worsen or if the condition fails to improve as anticipated.   Return in about 5 months (around 09/16/2020) for CPE and FBW 1 wk prior .    No orders of the defined types were placed in this encounter.   Meds ordered this encounter  Medications  . rosuvastatin (CRESTOR) 5 MG tablet    Sig: Take 1 tablet (5 mg total) by mouth daily.    Dispense:  90 tablet    Refill:  3    Order Specific Question:   Supervising Provider    Answer:   Nani Gasser D  [[id number]]  . traZODone (DESYREL) 50 MG tablet    Sig: Take 0.5-1 tablets (25-50 mg total) by mouth at bedtime as needed for sleep.    Dispense:  30 tablet    Refill:  1    Order Specific Question:   Supervising Provider    Answer:   Nani Gasser D [[id number]]    Medications Discontinued During This Encounter  Medication Reason  . rosuvastatin (CRESTOR) 5 MG tablet Reorder       Time spent on visit including pre-visit chart review and post-visit care was 23 minutes.      The 21st Century Cures Act was signed into law in 2016 which includes the topic of electronic health records.  This provides immediate access to information in MyChart.  This includes consultation notes, operative notes, office notes, lab results and pathology reports.  If you have any questions about what you read please let us know at your next visit or call us at the office.  We are right here with you.  Note:  This note was prepared with assistance of Dragon voice recognition software. Occasional wrong-word or sound-a-like substitutions may have occurred due to the inherent limitations of voice recognition software.   __________________________________________________________________________________     Patient Care Team  Relationship Specialty Notifications Start End  Dawn Cox, [place] PCP - General Physician Assistant  07/10/19   Crecencio Mc, MD Referring Physician Gastroenterology  05/31/17    Comment: Office address [address] [place], [place] [address]: phone [phone]  Farrel Conners, MD Consulting Physician Nephrology  11/30/18      -Vitals obtained; medications/ allergies reconciled;  personal medical, social, Sx etc.histories were updated by CMA, reviewed by me and are reflected in chart   Patient Active Problem List   Diagnosis Date Noted  . Dizziness 02/28/2019  . Pre-diabetes 11/29/2018  . Blood glucose elevated 08/29/2018  . Abnormal urinalysis 04/03/2018  . Falls  02/24/2018  . CKD (chronic kidney disease) stage 3, GFR 30-59 ml/min 01/10/2018  . Obesity (BMI 30-39.9) 01/10/2018  . Depression 12/22/2017  . Tremor 11/08/2017  . Perioral dermatitis 06/22/2017  . Diverticulitis 05/25/2017  . Hx of systemic lupus erythematosus (SLE) (HCC) 05/25/2017  . Other fatigue 05/25/2017  . Cardiovascular disease 05/25/2017  . Hyperlipidemia 05/25/2017  . Healthcare maintenance 05/25/2017  . Screening for breast cancer 05/25/2017  . Body aches 05/25/2017     Current Meds  Medication Sig  . albuterol (PROVENTIL HFA;VENTOLIN HFA) 108 (90 Base) MCG/ACT inhaler Inhale 1 puff into the lungs every 6 (six) hours as needed for wheezing or shortness of breath.  Marland Kitchen amLODipine (NORVASC) 5 MG tablet TAKE 1 TABLET(5 MG) BY MOUTH DAILY  . aspirin 325 MG tablet Take 325 mg by mouth once.   Marland Kitchen FLUoxetine (PROZAC) 40 MG capsule TAKE 1 CAPSULE BY MOUTH DAILY  . glycopyrrolate (ROBINUL) 2 MG tablet TAKE 1 TABLET(2 MG) BY MOUTH TWICE DAILY  . hydroxychloroquine (PLAQUENIL) 200 MG tablet Take 200 mg by mouth 2 (two) times daily.  . Multiple Vitamins-Minerals (HAIR SKIN AND NAILS FORMULA PO) Take 1 tablet by mouth daily.  . traZODone (DESYREL) 50 MG tablet Take 0.5-1 tablets (25-50 mg total) by mouth at bedtime as needed for sleep.  . Turmeric 500 MG CAPS Take 1,000 mg by mouth daily.  . [DISCONTINUED] rosuvastatin (CRESTOR) 5 MG tablet Take 1 tablet (5 mg total) by mouth daily.     Allergies:  Allergies  Allergen Reactions  . Aloe   . Latex   . Other     aryrthromycin- IBS Xray dye  . Shellfish Allergy   . Vibramycin [Doxycycline Calcium]      ROS:  See above HPI for pertinent positives and negatives   Objective:   Height 5' 5.5" (1.664 m), weight 217 lb (98.4 kg).  (if some vitals are omitted, this means that patient was UNABLE to obtain them. ) General: A & O * 3; sounds in no acute distress Respiratory: speaking in full sentences, no conversational  dyspnea Psych: insight appears good, mood- appears full

## 2020-04-18 NOTE — Telephone Encounter (Signed)
patient is having COVID symptoms, provided Covid testing # [id number]. thank you

## 2020-04-21 ENCOUNTER — Other Ambulatory Visit: Payer: Self-pay

## 2020-04-21 DIAGNOSIS — Z20822 Contact with and (suspected) exposure to covid-19: Secondary | ICD-10-CM

## 2020-04-22 LAB — SARS-COV-2, NAA 2 DAY TAT

## 2020-04-22 LAB — NOVEL CORONAVIRUS, NAA: SARS-CoV-2, NAA: NOT DETECTED

## 2020-05-05 ENCOUNTER — Encounter: Payer: Self-pay | Admitting: Gastroenterology

## 2020-06-20 ENCOUNTER — Other Ambulatory Visit: Payer: Self-pay | Admitting: Physician Assistant

## 2020-06-20 DIAGNOSIS — G4700 Insomnia, unspecified: Secondary | ICD-10-CM

## 2020-06-29 ENCOUNTER — Other Ambulatory Visit: Payer: Self-pay | Admitting: Physician Assistant

## 2020-06-30 MED ORDER — FLUOXETINE HCL 40 MG PO CAPS
40.0000 mg | ORAL_CAPSULE | Freq: Every day | ORAL | 0 refills | Status: DC
Start: 2020-06-30 — End: 2020-09-24

## 2020-07-17 ENCOUNTER — Telehealth: Payer: Self-pay | Admitting: Physician Assistant

## 2020-07-17 DIAGNOSIS — Z1231 Encounter for screening mammogram for malignant neoplasm of breast: Secondary | ICD-10-CM

## 2020-07-17 NOTE — Telephone Encounter (Signed)
Referral placed per protocol. AS, CMA 

## 2020-09-02 ENCOUNTER — Other Ambulatory Visit: Payer: Self-pay | Admitting: Physician Assistant

## 2020-09-02 DIAGNOSIS — I10 Essential (primary) hypertension: Secondary | ICD-10-CM

## 2020-09-02 NOTE — Telephone Encounter (Signed)
Please contact patient to schedule follow up per last AVS

## 2020-09-15 ENCOUNTER — Telehealth: Payer: Self-pay | Admitting: Physician Assistant

## 2020-09-15 NOTE — Telephone Encounter (Signed)
Please add patient for tomorrow schedule with [facility].

## 2020-09-15 NOTE — Telephone Encounter (Signed)
Patient's husband called in stating patient had bronchitis and she has taken a negative COVID test yesterday. Heavy breathing, cough, no sore throat. Please advise, thanks.

## 2020-09-16 ENCOUNTER — Ambulatory Visit: Payer: Federal, State, Local not specified - PPO | Admitting: Nurse Practitioner

## 2020-09-17 ENCOUNTER — Ambulatory Visit (INDEPENDENT_AMBULATORY_CARE_PROVIDER_SITE_OTHER): Payer: Federal, State, Local not specified - PPO

## 2020-09-17 ENCOUNTER — Ambulatory Visit
Admission: EM | Admit: 2020-09-17 | Discharge: 2020-09-17 | Disposition: A | Discharge status: Home / Self Care | Payer: Federal, State, Local not specified - PPO | Attending: Internal Medicine | Admitting: Internal Medicine

## 2020-09-17 DIAGNOSIS — R0602 Shortness of breath: Secondary | ICD-10-CM | POA: Diagnosis not present

## 2020-09-17 DIAGNOSIS — R059 Cough, unspecified: Secondary | ICD-10-CM | POA: Diagnosis not present

## 2020-09-17 DIAGNOSIS — J189 Pneumonia, unspecified organism: Secondary | ICD-10-CM | POA: Insufficient documentation

## 2020-09-17 LAB — POCT RAPID STREP A (OFFICE): Rapid Strep A Screen: NEGATIVE

## 2020-09-17 MED ORDER — AMOXICILLIN-POT CLAVULANATE 875-125 MG PO TABS: 1.0000 | ORAL_TABLET | Freq: Two times a day (BID) | ORAL | 0 refills | Status: AC

## 2020-09-17 MED ORDER — PREDNISONE 20 MG PO TABS: 40.0000 mg | ORAL_TABLET | Freq: Every day | ORAL | 0 refills | Status: AC

## 2020-09-17 MED ORDER — ALBUTEROL SULFATE HFA 108 (90 BASE) MCG/ACT IN AERS: 1.0000 | INHALATION_SPRAY | Freq: Four times a day (QID) | RESPIRATORY_TRACT | 0 refills | Status: DC | PRN

## 2020-09-17 MED ORDER — CEFDINIR 300 MG PO CAPS: 300.0000 mg | ORAL_CAPSULE | Freq: Two times a day (BID) | ORAL | 0 refills | Status: DC

## 2020-09-17 NOTE — ED Provider Notes (Signed)
[facility]    CSN: [id number] Arrival date & time: 09/17/20  1625      History   Chief Complaint Chief Complaint  Patient presents with   Cough   Tinnitus    bilateral    HPI Dawn Cox is a 75 y.o. female.   Reports a 2-week history of cough, nasal congestion, ringing in ears.  Denies fevers at home.  Patient states that she does have some right-sided abdominal pain only when she coughs.  Patient has been using over-the-counter Robitussin and nasal spray with minimal relief of symptoms denies vomiting or diarrhea but has had occasional nausea.  States that she uses an albuterol inhaler as needed when she gets sick and has been told that she has asthma.  Patient needs a refill on her albuterol inhaler because she has not had it for "a long time" and has not used it for this acute illness.  Patient is having intermittent shortness of breath as well.  Denies current shortness of breath or chest pain.  Patient did have COVID 19 about 1.5 months ago but has not had any sick contacts recently.  Patient states that she took an at home COVID test and it was negative.   Cough  Past Medical History:  Diagnosis Date   Ankle fracture    right   Anxiety    Depression    Diverticulitis    Hyperlipidemia    Hypertension    Lupus (HCC)    Retinal detachment    Retinal hemorrhage     Patient Active Problem List   Diagnosis Date Noted   Dizziness 02/28/2019   Pre-diabetes 11/29/2018   Blood glucose elevated 08/29/2018   Abnormal urinalysis 04/03/2018   Falls 02/24/2018   CKD (chronic kidney disease) stage 3, GFR 30-59 ml/min 01/10/2018   Obesity (BMI 30-39.9) 01/10/2018   Depression 12/22/2017   Tremor 11/08/2017   Perioral dermatitis 06/22/2017   Diverticulitis 05/25/2017   Hx of systemic lupus erythematosus (SLE) (HCC) 05/25/2017   Other fatigue 05/25/2017   Cardiovascular disease 05/25/2017   Hyperlipidemia 05/25/2017   Healthcare maintenance 05/25/2017    Screening for breast cancer 05/25/2017   Body aches 05/25/2017    Past Surgical History:  Procedure Laterality Date   ABDOMINAL HYSTERECTOMY     BREAST BIOPSY     BREAST SURGERY Right    cyst removal   CATARACT EXTRACTION Bilateral    EYE SURGERY Right    hemorrhagia of retina   HERNIA REPAIR     RETINAL DETACHMENT SURGERY      OB History   No obstetric history on file.      Home Medications    Prior to Admission medications   Medication Sig Start Date End Date Taking? Authorizing Provider  albuterol (VENTOLIN HFA) 108 (90 Base) MCG/ACT inhaler Inhale 1-2 puffs into the lungs every 6 (six) hours as needed for wheezing or shortness of breath. 09/17/20  Yes Lance Muss, FNP  amoxicillin-clavulanate (AUGMENTIN) 875-125 MG tablet Take 1 tablet by mouth every 12 (twelve) hours for 10 days. 09/17/20 09/27/20 Yes Lance Muss, FNP  predniSONE (DELTASONE) 20 MG tablet Take 2 tablets (40 mg total) by mouth daily for 5 days. 09/17/20 09/22/20 Yes Lance Muss, FNP  amLODipine (NORVASC) 5 MG tablet TAKE 1 TABLET(5 MG) BY MOUTH DAILY **PLEASE CONTACT OUR OFFICE FOR FUTURE MED REFILLS** 09/02/20   Mayer Masker, PA-C  aspirin 325 MG tablet Take 325 mg by mouth once.  [provider]  FLUoxetine (PROZAC) 40 MG capsule Take 1 capsule (40 mg total) by mouth daily. 06/30/20   Mayer MaskerAbonza, Maritza, PA-C  glycopyrrolate (ROBINUL) 2 MG tablet TAKE 1 TABLET(2 MG) BY MOUTH TWICE DAILY 01/17/20   Sherrilyn Ristanis, Henry L III, MD  hydroxychloroquine (PLAQUENIL) 200 MG tablet Take 200 mg by mouth 2 (two) times daily.    [provider]  Multiple Vitamins-Minerals (HAIR SKIN AND NAILS FORMULA PO) Take 1 tablet by mouth daily.    [provider]  rosuvastatin (CRESTOR) 5 MG tablet Take 1 tablet (5 mg total) by mouth daily. 04/18/20   Mayer MaskerAbonza, Maritza, PA-C  traZODone (DESYREL) 50 MG tablet TAKE 1/2 TO 1 TABLET(25 TO 50 MG) BY MOUTH AT BEDTIME AS NEEDED FOR SLEEP 06/20/20   Abonza, Maritza,  PA-C  Turmeric 500 MG CAPS Take 1,000 mg by mouth daily.    [provider]    Family History Family History  Problem Relation Age of Onset   Heart attack Mother    Liver cancer Father    Depression Daughter        suicide   Breast cancer Cousin    Colon cancer Neg Hx    Stomach cancer Neg Hx    Esophageal cancer Neg Hx    Rectal cancer Neg Hx     Social History Social History   Tobacco Use   Smoking status: Never   Smokeless tobacco: Never  Vaping Use   Vaping Use: Never used  Substance Use Topics   Alcohol use: Yes    Comment: rarely   Drug use: No     Allergies   Aloe, Latex, Other, Shellfish allergy, and Vibramycin [doxycycline calcium]   Review of Systems Review of Systems  Respiratory:  Positive for cough.   Per HPI  Physical Exam Triage Vital Signs ED Triage Vitals  Enc Vitals Group     BP 09/17/20 1739 137/78     Pulse Rate 09/17/20 1739 82     Resp 09/17/20 1739 18     Temp 09/17/20 1739 98 F (36.7 C)     Temp Source 09/17/20 1739 Oral     SpO2 09/17/20 1739 96 %     Weight --      Height --      Head Circumference --      Peak Flow --      Pain Score 09/17/20 1742 8     Pain Loc --      Pain Edu? --      Excl. in GC? --    No data found.  Updated Vital Signs BP 137/78 (BP Location: Left Arm)   Pulse 82   Temp 98 F (36.7 C) (Oral)   Resp 18   SpO2 96%   Visual Acuity Right Eye Distance:   Left Eye Distance:   Bilateral Distance:    Right Eye Near:   Left Eye Near:    Bilateral Near:     Physical Exam Constitutional:      General: She is not in acute distress.    Appearance: Normal appearance.  HENT:     Head: Normocephalic and atraumatic.     Right Ear: Ear canal normal. A middle ear effusion is present.     Left Ear: Ear canal normal. A middle ear effusion is present.     Nose: Congestion present.     Mouth/Throat:     Mouth: Mucous membranes are moist.     Pharynx: Posterior oropharyngeal  erythema  present.  Eyes:     Extraocular Movements: Extraocular movements intact.     Conjunctiva/sclera: Conjunctivae normal.     Pupils: Pupils are equal, round, and reactive to light.  Cardiovascular:     Rate and Rhythm: Normal rate and regular rhythm.     Pulses: Normal pulses.     Heart sounds: Normal heart sounds.  Pulmonary:     Effort: Pulmonary effort is normal. No respiratory distress.     Breath sounds: Normal breath sounds. No wheezing.     Comments: Harsh cough on exam. Abdominal:     General: Abdomen is flat. Bowel sounds are normal. There is no distension.     Palpations: Abdomen is soft. There is no mass.     Tenderness: There is no abdominal tenderness. There is no guarding.     Hernia: No hernia is present.  Musculoskeletal:        General: Normal range of motion.     Cervical back: Normal range of motion.  Skin:    General: Skin is warm and dry.  Neurological:     General: No focal deficit present.     Mental Status: She is alert and oriented to person, place, and time. Mental status is at baseline.  Psychiatric:        Mood and Affect: Mood normal.        Behavior: Behavior normal.     UC Treatments / Results  Labs (all labs ordered are listed, but only abnormal results are displayed) Labs Reviewed  CULTURE, GROUP A STREP ([facility])  NOVEL CORONAVIRUS, NAA  POCT RAPID STREP A (OFFICE)    EKG   Radiology DG Chest 2 View  Result Date: 09/17/2020 CLINICAL DATA:  Shortness of breath EXAM: CHEST - 2 VIEW COMPARISON:  Radiograph 05/04/2017 FINDINGS: Diffuse mild airways thickening. Streaky and bandlike atelectatic changes in the lung bases. More patchy right infrahilar opacity, could reflect further volume loss or early airspace disease. No pneumothorax. No layering effusion. Cardiomediastinal contours are stable. No acute osseous or soft tissue abnormality. IMPRESSION: Bibasilar atelectasis. Mild airways thickening with more patchy right infrahilar opacity which  could reflect developing airspace disease or volume loss. Electronically Signed   By: Kreg Shropshire M.D.   On: 09/17/2020 19:12    Procedures Procedures (including critical care time)  Medications Ordered in UC Medications - No data to display  Initial Impression / Assessment and Plan / UC Course  I have reviewed the triage vital signs and the nursing notes.  Pertinent labs & imaging results that were available during my care of the patient were reviewed by me and considered in my medical decision making (see chart for details).     Chest x-ray results showing signs of community-acquired pneumonia.  Will treat with Augmentin and steroid to decrease inflammation and help with cough.  Called pharmacy to cancel cefdinir prescription.  It is also necessary to add azithromycin in addition to Augmentin to treat community-acquired pneumonia but patient chart is unclear if patient is allergic to this class of antibiotics.  Attempted to call patient to confirm allergy but she did not answer. Left voicemail.  Patient encouraged to continue over-the-counter Mucinex and/or Coricidin HBP as needed. Patient advised to go to the hospital if shortness of breath becomes constant or worsens.  Monitor for fever at home.  COVID-19 viral swab pending.  Rapid strep negative in office.  Throat culture pending. Discussed strict return precautions. Patient verbalized understanding and is agreeable with plan.  Final Clinical Impressions(s) / UC Diagnoses   Final diagnoses:  Acute upper respiratory infection  Shortness of breath  Cough  Community acquired pneumonia, unspecified laterality     Discharge Instructions      You are being treated today with an antibiotic (cefdinir) and a steroid (prednisone) to treat respiratory infection and decrease inflammation.  You may continue over-the-counter medications such as Mucinex and Coricidin HBP for congestion and cough. Covid 19 viral swab and throat culture are  pending. We will call if it is positive.  Please go to the hospital if shortness of breath worsens.  Follow-up with primary care physician if symptoms do not improve with current treatment plan.     ED Prescriptions     Medication Sig Dispense Auth. Provider   predniSONE (DELTASONE) 20 MG tablet Take 2 tablets (40 mg total) by mouth daily for 5 days. 10 tablet Lance Muss, FNP   cefdinir (OMNICEF) 300 MG capsule  (Status: Discontinued) Take 1 capsule (300 mg total) by mouth 2 (two) times daily for 10 days. 20 capsule Lance Muss, FNP   albuterol (VENTOLIN HFA) 108 (90 Base) MCG/ACT inhaler Inhale 1-2 puffs into the lungs every 6 (six) hours as needed for wheezing or shortness of breath. 1 each Lance Muss, FNP   amoxicillin-clavulanate (AUGMENTIN) 875-125 MG tablet Take 1 tablet by mouth every 12 (twelve) hours for 10 days. 20 tablet Lance Muss, FNP      PDMP not reviewed this encounter.   Lance Muss, FNP 09/17/20 1932

## 2020-09-17 NOTE — ED Triage Notes (Signed)
Two week h/o HA, cough, right sided abdominal and thoracic pain when coughing and tinnitus of both ears. Has been taking robitussin and nasal spray with some relief. Confirms nausea. Denies v/d.  Pt has inhaler but needs Albuterol refill.  Dx with covid approximately 1.5 months ago.

## 2020-09-17 NOTE — Discharge Instructions (Addendum)
You are being treated today with an antibiotic (cefdinir) and a steroid (prednisone) to treat respiratory infection and decrease inflammation.  You may continue over-the-counter medications such as Mucinex and Coricidin HBP for congestion and cough. Covid 19 viral swab and throat culture are pending. We will call if it is positive.  Please go to the hospital if shortness of breath worsens.  Follow-up with primary care physician if symptoms do not improve with current treatment plan.

## 2020-09-18 ENCOUNTER — Telehealth (HOSPITAL_COMMUNITY): Payer: Self-pay | Admitting: Emergency Medicine

## 2020-09-18 ENCOUNTER — Telehealth: Payer: Self-pay | Admitting: Internal Medicine

## 2020-09-18 LAB — NOVEL CORONAVIRUS, NAA: SARS-CoV-2, NAA: NOT DETECTED

## 2020-09-18 LAB — SARS-COV-2, NAA 2 DAY TAT

## 2020-09-18 NOTE — Telephone Encounter (Signed)
Reviewed allergies with patient, reviewed with Rolly Salter, APP, and patient updated on plan and ER precautions.  Patient verbalized understanding

## 2020-09-18 NOTE — Telephone Encounter (Signed)
Attempted to contact patient again for the second time in regards to her chest x-ray result and the need to clarify antibiotic allergies in order to add azithromycin to her current treatment plan. Patient did not answer. Left voicemail.

## 2020-09-20 LAB — CULTURE, GROUP A STREP (THRC)

## 2020-09-24 ENCOUNTER — Other Ambulatory Visit: Payer: Self-pay | Admitting: Physician Assistant

## 2020-09-27 ENCOUNTER — Other Ambulatory Visit: Payer: Self-pay | Admitting: Physician Assistant

## 2020-10-17 ENCOUNTER — Encounter: Payer: Self-pay | Admitting: Physician Assistant

## 2020-10-17 ENCOUNTER — Other Ambulatory Visit: Payer: Self-pay

## 2020-10-17 ENCOUNTER — Ambulatory Visit (INDEPENDENT_AMBULATORY_CARE_PROVIDER_SITE_OTHER): Payer: Federal, State, Local not specified - PPO | Admitting: Physician Assistant

## 2020-10-17 VITALS — BP 133/63 | HR 71 | Temp 97.6°F | Ht 65.5 in | Wt 213.5 lb

## 2020-10-17 DIAGNOSIS — N1831 Chronic kidney disease, stage 3a: Secondary | ICD-10-CM

## 2020-10-17 DIAGNOSIS — I10 Essential (primary) hypertension: Secondary | ICD-10-CM

## 2020-10-17 DIAGNOSIS — E785 Hyperlipidemia, unspecified: Secondary | ICD-10-CM

## 2020-10-17 DIAGNOSIS — Z1231 Encounter for screening mammogram for malignant neoplasm of breast: Secondary | ICD-10-CM

## 2020-10-17 DIAGNOSIS — Z0000 Encounter for general adult medical examination without abnormal findings: Secondary | ICD-10-CM | POA: Diagnosis not present

## 2020-10-17 DIAGNOSIS — E2839 Other primary ovarian failure: Secondary | ICD-10-CM | POA: Diagnosis not present

## 2020-10-17 DIAGNOSIS — Z780 Asymptomatic menopausal state: Secondary | ICD-10-CM | POA: Diagnosis not present

## 2020-10-17 DIAGNOSIS — R7303 Prediabetes: Secondary | ICD-10-CM

## 2020-10-17 NOTE — Progress Notes (Signed)
Subjective:   Dawn Cox is a 75 y.o. female who presents for Medicare Annual (Subsequent) preventive examination.  Review of Systems    General:   No F/C, wt loss Pulm:   No DIB, SOB, pleuritic chest pain Card:  No CP, palpitations Abd:  No n/v/d or pain Ext:  No inc edema from baseline    Objective:    Today's Vitals   10/17/20 1117  BP: 133/63  Pulse: 71  Temp: 97.6 F (36.4 C)  SpO2: 98%  Weight: 213 lb 8 oz (96.8 kg)  Height: 5' 5.5" (1.664 m)   Body mass index is 34.99 kg/m.  Advanced Directives 05/25/2017 05/04/2017 04/30/2017  Does Patient Have a Medical Advance Directive? No No No  Would patient like information on creating a medical advance directive? No - Patient declined - No - Patient declined    Current Medications (verified) Outpatient Encounter Medications as of 10/17/2020  Medication Sig   albuterol (VENTOLIN HFA) 108 (90 Base) MCG/ACT inhaler Inhale 1-2 puffs into the lungs every 6 (six) hours as needed for wheezing or shortness of breath.   amLODipine (NORVASC) 5 MG tablet TAKE 1 TABLET(5 MG) BY MOUTH DAILY **PLEASE CONTACT OUR OFFICE FOR FUTURE MED REFILLS**   aspirin 325 MG tablet Take 325 mg by mouth once.    FLUoxetine (PROZAC) 40 MG capsule TAKE 1 CAPSULE(40 MG) BY MOUTH DAILY   glycopyrrolate (ROBINUL) 2 MG tablet TAKE 1 TABLET(2 MG) BY MOUTH TWICE DAILY   hydroxychloroquine (PLAQUENIL) 200 MG tablet Take 200 mg by mouth 2 (two) times daily.   Multiple Vitamins-Minerals (HAIR SKIN AND NAILS FORMULA PO) Take 1 tablet by mouth daily.   rosuvastatin (CRESTOR) 5 MG tablet Take 1 tablet (5 mg total) by mouth daily.   traZODone (DESYREL) 50 MG tablet TAKE 1/2 TO 1 TABLET(25 TO 50 MG) BY MOUTH AT BEDTIME AS NEEDED FOR SLEEP   Turmeric 500 MG CAPS Take 1,000 mg by mouth daily.   No facility-administered encounter medications on file as of 10/17/2020.    Allergies (verified) Aloe, Latex, Other, Shellfish allergy, and Vibramycin [doxycycline calcium]    History: Past Medical History:  Diagnosis Date   Ankle fracture    right   Anxiety    Depression    Diverticulitis    Hyperlipidemia    Hypertension    Lupus (HCC)    Retinal detachment    Retinal hemorrhage    Past Surgical History:  Procedure Laterality Date   ABDOMINAL HYSTERECTOMY     BREAST BIOPSY     BREAST SURGERY Right    cyst removal   CATARACT EXTRACTION Bilateral    EYE SURGERY Right    hemorrhagia of retina   HERNIA REPAIR     RETINAL DETACHMENT SURGERY     Family History  Problem Relation Age of Onset   Heart attack Mother    Liver cancer Father    Depression Daughter        suicide   Breast cancer Cousin    Colon cancer Neg Hx    Stomach cancer Neg Hx    Esophageal cancer Neg Hx    Rectal cancer Neg Hx    Social History   Socioeconomic History   Marital status: Married    Spouse name: [name]   Number of children: 2   Years of education: Not on file   Highest education level: Not on file  Occupational History   Occupation: retired  Tobacco Use   Smoking status:  Never   Smokeless tobacco: Never  Vaping Use   Vaping Use: Never used  Substance and Sexual Activity   Alcohol use: Yes    Comment: rarely   Drug use: No   Sexual activity: Not Currently  Other Topics Concern   Not on file  Social History Narrative   Not on file   Social Determinants of Health   Financial Resource Strain: Not on file  Food Insecurity: Not on file  Transportation Needs: Not on file  Physical Activity: Not on file  Stress: Not on file  Social Connections: Not on file    Tobacco Counseling Counseling given: Not Answered    Diabetic?No   Activities of Daily Living In your present state of health, do you have any difficulty performing the following activities: 10/17/2020 04/18/2020  Hearing? N Y  Vision? Y N  Difficulty concentrating or making decisions? N N  Walking or climbing stairs? Y Y  Dressing or bathing? N Y  Doing errands, shopping?  Y N  Some recent data might be hidden    Patient Care Team: Mayer Masker, PA-C as PCP - General (Physician Assistant) Crecencio Mc, MD as Referring Physician (Gastroenterology) Janit Pagan, Alpha Gula, MD as Consulting Physician (Nephrology)  Indicate any recent Medical Services you may have received from other than [facility] providers in the past year (date may be approximate).     Assessment:   This is a routine wellness examination for Dawn Cox.  Hearing/Vision screen No results found.  Dietary issues and exercise activities discussed: -Continue with monitoring carbohydrates and glucose intake, follow low fat diet. Continue to stay well hydrated. Stay as active as possible.   Goals Addressed   None   Depression Screen PHQ 2/9 Scores 10/17/2020 04/18/2020 12/04/2019 08/22/2019 07/10/2019 02/28/2019 11/29/2018  PHQ - 2 Score 2 1 0 1 1 3 2   PHQ- 9 Score 5 3 1 7 6 9 5     Fall Risk Fall Risk  10/17/2020 04/18/2020 12/04/2019 08/22/2019 07/26/2019  Falls in the past year? 1 0 0 0 0  Number falls in past yr: 0 - - - -  Injury with Fall? 0 - - - -  Risk for fall due to : History of fall(s) - - History of fall(s) -  Follow up Falls evaluation completed Falls evaluation completed Falls evaluation completed Falls evaluation completed Falls evaluation completed    FALL RISK PREVENTION PERTAINING TO THE HOME:  Any stairs in or around the home? Yes  If so, are there any without handrails? Yes  Home free of loose throw rugs in walkways, pet beds, electrical cords, etc? Yes  Adequate lighting in your home to reduce risk of falls? Yes   ASSISTIVE DEVICES UTILIZED TO PREVENT FALLS:  Life alert? No  Use of a cane, walker or w/c? No  Grab bars in the bathroom? Yes  Shower chair or bench in shower? No  Elevated toilet seat or a handicapped toilet? No   TIMED UP AND GO:  Was the test performed? Yes .  Length of time to ambulate 10 feet: 11 sec.   Gait slow and steady without use of assistive  device  Cognitive Function: wnl's     6CIT Screen 10/17/2020  What Year? 0 points  What month? 0 points  What time? 3 points  Count back from 20 0 points  Months in reverse 0 points  Repeat phrase 0 points  Total Score 3    Immunizations Immunization History  Administered Date(s) Administered  Fluad Quad(high Dose 65+) 11/29/2018   Influenza, High Dose Seasonal PF 12/21/2016, 12/22/2017   PFIZER(Purple Top)SARS-COV-2 Vaccination 06/13/2019, 07/14/2019   Pneumococcal Conjugate-13 03/22/2017   Pneumococcal Polysaccharide-23 10/04/2011   Tdap 07/19/2007, 08/22/2019   Zoster Recombinat (Shingrix) 08/22/2019   Zoster, Live 08/10/2011    TDAP status: Up to date  Flu Vaccine status: Up to date  Pneumococcal vaccine status: Up to date  Covid-19 vaccine status: Completed vaccines  Qualifies for Shingles Vaccine? Yes   Zostavax completed No   Shingrix Completed?: No.    Education has been provided regarding the importance of this vaccine. Patient has been advised to call insurance company to determine out of pocket expense if they have not yet received this vaccine. Advised may also receive vaccine at local pharmacy or Health Dept. Verbalized acceptance and understanding.  Screening Tests Health Maintenance  Topic Date Due   MAMMOGRAM  07/28/2019   Zoster Vaccines- Shingrix (2 of 2) 10/17/2019   COVID-19 Vaccine (3 - Booster for Pfizer series) 12/14/2019   INFLUENZA VACCINE  10/20/2020   COLONOSCOPY (Pts [id number] Insurance coverage will need to be confirmed)  05/08/2024   TETANUS/TDAP  08/21/2029   DEXA SCAN  Completed   Hepatitis C Screening  Completed   PNA vac Low Risk Adult  Completed   HPV VACCINES  Aged Out    Health Maintenance  Health Maintenance Due  Topic Date Due   MAMMOGRAM  07/28/2019   Zoster Vaccines- Shingrix (2 of 2) 10/17/2019   COVID-19 Vaccine (3 - Booster for Pfizer series) 12/14/2019   Colorectal: Done in 2016 follow up in 10  years  Mammogram status: Ordered today. Pt provided with contact info and advised to call to schedule appt.   Bone Density status: Ordered today. Pt provided with contact info and advised to call to schedule appt.  Lung Cancer Screening: (Low Dose CT Chest recommended if Age 83-80 years, 30 pack-year currently smoking OR have quit w/in 15years.) does not qualify.   Lung Cancer Screening Referral:   Additional Screening:  Hepatitis C Screening: does qualify; Completed 08/22/2019  Vision Screening: Recommended annual ophthalmology exams for early detection of glaucoma and other disorders of the eye. Is the patient up to date with their annual eye exam?  Yes  Who is the provider or what is the name of the office in which the patient attends annual eye exams? [facility] If pt is not established with a provider, would they like to be referred to a provider to establish care? No .   Dental Screening: Recommended annual dental exams for proper oral hygiene  Community Resource Referral / Chronic Care Management: CRR required this visit?  No   CCM required this visit?  No      Plan:  -Will obtain fasting labs. -Continue current medication regimen. -If vertigo fails to improve or worsen will consider medication therapy. -Follow up in 4 months for HTN, HLD, prediabetes and CKD.  I have personally reviewed and noted the following in the patient's chart:   Medical and social history Use of alcohol, tobacco or illicit drugs  Current medications and supplements including opioid prescriptions.  Functional ability and status Nutritional status Physical activity Advanced directives List of other physicians Hospitalizations, surgeries, and ER visits in previous 12 months Vitals Screenings to include cognitive, depression, and falls Referrals and appointments  In addition, I have reviewed and discussed with patient certain preventive protocols, quality metrics, and best practice  recommendations. A written personalized care plan for  preventive services as well as general preventive health recommendations were provided to patient.

## 2020-10-17 NOTE — Patient Instructions (Signed)
Preventive Care 75 Years and Older, Female Preventive care refers to lifestyle choices and visits with your health care provider that can promote health and wellness. This includes: A yearly physical exam. This is also called an annual wellness visit. Regular dental and eye exams. Immunizations. Screening for certain conditions. Healthy lifestyle choices, such as: Eating a healthy diet. Getting regular exercise. Not using drugs or products that contain nicotine and tobacco. Limiting alcohol use. What can I expect for my preventive care visit? Physical exam Your health care provider will check your: Height and weight. These may be used to calculate your BMI (body mass index). BMI is a measurement that tells if you are at a healthy weight. Heart rate and blood pressure. Body temperature. Skin for abnormal spots. Counseling Your health care provider may ask you questions about your: Past medical problems. Family's medical history. Alcohol, tobacco, and drug use. Emotional well-being. Home life and relationship well-being. Sexual activity. Diet, exercise, and sleep habits. History of falls. Memory and ability to understand (cognition). Work and work Statistician. Pregnancy and menstrual history. Access to firearms. What immunizations do I need?  Vaccines are usually given at various ages, according to a schedule. Your health care provider will recommend vaccines for you based on your age, medicalhistory, and lifestyle or other factors, such as travel or where you work. What tests do I need? Blood tests Lipid and cholesterol levels. These may be checked every 5 years, or more often depending on your overall health. Hepatitis C test. Hepatitis B test. Screening Lung cancer screening. You may have this screening every year starting at age 44 if you have a 30-pack-year history of smoking and currently smoke or have quit within the past 15 years. Colorectal cancer screening. All  adults should have this screening starting at age 39 and continuing until age 65. Your health care provider may recommend screening at age 61 if you are at increased risk. You will have tests every 1-10 years, depending on your results and the type of screening test. Diabetes screening. This is done by checking your blood sugar (glucose) after you have not eaten for a while (fasting). You may have this done every 1-3 years. Mammogram. This may be done every 1-2 years. Talk with your health care provider about how often you should have regular mammograms. Abdominal aortic aneurysm (AAA) screening. You may need this if you are a current or former smoker. BRCA-related cancer screening. This may be done if you have a family history of breast, ovarian, tubal, or peritoneal cancers. Other tests STD (sexually transmitted disease) testing, if you are at risk. Bone density scan. This is done to screen for osteoporosis. You may have this done starting at age 54. Talk with your health care provider about your test results, treatment options,and if necessary, the need for more tests. Follow these instructions at home: Eating and drinking  Eat a diet that includes fresh fruits and vegetables, whole grains, lean protein, and low-fat dairy products. Limit your intake of foods with high amounts of sugar, saturated fats, and salt. Take vitamin and mineral supplements as recommended by your health care provider. Do not drink alcohol if your health care provider tells you not to drink. If you drink alcohol: Limit how much you have to 0-1 drink a day. Be aware of how much alcohol is in your drink. In the [place] one drink equals one 12 oz bottle of beer (355 mL), one 5 oz glass of wine (148 mL), or one 1  oz glass of hard liquor (44 mL).  Lifestyle Take daily care of your teeth and gums. Brush your teeth every morning and night with fluoride toothpaste. Floss one time each day. Stay active. Exercise for at  least 30 minutes 5 or more days each week. Do not use any products that contain nicotine or tobacco, such as cigarettes, e-cigarettes, and chewing tobacco. If you need help quitting, ask your health care provider. Do not use drugs. If you are sexually active, practice safe sex. Use a condom or other form of protection in order to prevent STIs (sexually transmitted infections). Talk with your health care provider about taking a low-dose aspirin or statin. Find healthy ways to cope with stress, such as: Meditation, yoga, or listening to music. Journaling. Talking to a trusted person. Spending time with friends and family. Safety Always wear your seat belt while driving or riding in a vehicle. Do not drive: If you have been drinking alcohol. Do not ride with someone who has been drinking. When you are tired or distracted. While texting. Wear a helmet and other protective equipment during sports activities. If you have firearms in your house, make sure you follow all gun safety procedures. What's next? Visit your health care provider once a year for an annual wellness visit. Ask your health care provider how often you should have your eyes and teeth checked. Stay up to date on all vaccines. This information is not intended to replace advice given to you by your health care provider. Make sure you discuss any questions you have with your healthcare provider. Document Revised: 02/27/2020 Document Reviewed: 03/02/2018 Elsevier Patient Education  2022 [employer].

## 2020-10-18 LAB — COMPREHENSIVE METABOLIC PANEL
ALT: 54 IU/L — ABNORMAL HIGH (ref 0–32)
AST: 43 IU/L — ABNORMAL HIGH (ref 0–40)
Albumin/Globulin Ratio: 1.6 (ref 1.2–2.2)
Albumin: 4.3 g/dL (ref 3.7–4.7)
Alkaline Phosphatase: 97 IU/L (ref 44–121)
BUN/Creatinine Ratio: 11 — ABNORMAL LOW (ref 12–28)
BUN: 14 mg/dL (ref 8–27)
Bilirubin Total: 0.3 mg/dL (ref 0.0–1.2)
CO2: 24 mmol/L (ref 20–29)
Calcium: 9.6 mg/dL (ref 8.7–10.3)
Chloride: 104 mmol/L (ref 96–106)
Creatinine, Ser: 1.33 mg/dL — ABNORMAL HIGH (ref 0.57–1.00)
Globulin, Total: 2.7 g/dL (ref 1.5–4.5)
Glucose: 140 mg/dL — ABNORMAL HIGH (ref 65–99)
Potassium: 4.4 mmol/L (ref 3.5–5.2)
Sodium: 143 mmol/L (ref 134–144)
Total Protein: 7 g/dL (ref 6.0–8.5)
eGFR: 42 mL/min/{1.73_m2} — ABNORMAL LOW (ref >59)

## 2020-10-18 LAB — CBC
Hematocrit: 38.6 % (ref 34.0–46.6)
Hemoglobin: 13.1 g/dL (ref 11.1–15.9)
MCH: 29.4 pg (ref 26.6–33.0)
MCHC: 33.9 g/dL (ref 31.5–35.7)
MCV: 87 fL (ref 79–97)
Platelets: 215 10*3/uL (ref 150–450)
RBC: 4.45 x10E6/uL (ref 3.77–5.28)
RDW: 13.9 % (ref 11.7–15.4)
WBC: 5.3 10*3/uL (ref 3.4–10.8)

## 2020-10-18 LAB — LIPID PANEL
Chol/HDL Ratio: 2.6 ratio (ref 0.0–4.4)
Cholesterol, Total: 147 mg/dL (ref 100–199)
HDL: 57 mg/dL (ref >39)
LDL Chol Calc (NIH): 72 mg/dL (ref 0–99)
Triglycerides: 97 mg/dL (ref 0–149)
VLDL Cholesterol Cal: 18 mg/dL (ref 5–40)

## 2020-10-18 LAB — HEMOGLOBIN A1C
Est. average glucose Bld gHb Est-mCnc: 151 mg/dL
Hgb A1c MFr Bld: 6.9 % — ABNORMAL HIGH (ref 4.8–5.6)

## 2020-10-18 LAB — TSH: TSH: 3.27 u[IU]/mL (ref 0.450–4.500)

## 2020-10-21 ENCOUNTER — Telehealth: Payer: Self-pay

## 2020-10-21 NOTE — Telephone Encounter (Signed)
Incoming fax request for refills on Glycopyrrolate 2 mg tabs 1 po BID as needed. Patient was last seen 09-12-2018 and has no current scheduled follow up. Please advise on refills.

## 2020-10-22 NOTE — Telephone Encounter (Signed)
Office visit needed for ongoing medication management. It can be refilled for a two month supply if she makes a clinic appointment with me or an APP.  If more convenient for her to have PCP manage it ,and they are willing to do so, that is OK too.  - HD

## 2020-10-22 NOTE — Telephone Encounter (Signed)
Follow up has been scheduled per patient request for 12-12-2020 @ 1040am .

## 2020-10-24 ENCOUNTER — Ambulatory Visit
Admission: RE | Admit: 2020-10-24 | Discharge: 2020-10-24 | Disposition: A | Discharge status: Home / Self Care | Payer: Federal, State, Local not specified - PPO | Source: Ambulatory Visit | Attending: Physician Assistant | Admitting: Physician Assistant

## 2020-10-24 ENCOUNTER — Other Ambulatory Visit: Payer: Self-pay

## 2020-10-24 DIAGNOSIS — Z1231 Encounter for screening mammogram for malignant neoplasm of breast: Secondary | ICD-10-CM

## 2020-10-28 ENCOUNTER — Ambulatory Visit
Admission: RE | Admit: 2020-10-28 | Discharge: 2020-10-28 | Disposition: A | Discharge status: Home / Self Care | Payer: Federal, State, Local not specified - PPO | Source: Ambulatory Visit | Attending: Physician Assistant | Admitting: Physician Assistant

## 2020-10-28 ENCOUNTER — Other Ambulatory Visit: Payer: Self-pay

## 2020-10-28 DIAGNOSIS — Z780 Asymptomatic menopausal state: Secondary | ICD-10-CM

## 2020-10-28 DIAGNOSIS — E2839 Other primary ovarian failure: Secondary | ICD-10-CM

## 2020-10-29 ENCOUNTER — Encounter: Payer: Self-pay | Admitting: Physician Assistant

## 2020-12-01 ENCOUNTER — Other Ambulatory Visit: Payer: Self-pay | Admitting: Physician Assistant

## 2020-12-01 DIAGNOSIS — I10 Essential (primary) hypertension: Secondary | ICD-10-CM

## 2020-12-10 ENCOUNTER — Other Ambulatory Visit: Payer: Self-pay | Admitting: Physician Assistant

## 2020-12-10 DIAGNOSIS — I10 Essential (primary) hypertension: Secondary | ICD-10-CM

## 2020-12-12 ENCOUNTER — Ambulatory Visit: Payer: Federal, State, Local not specified - PPO | Admitting: Gastroenterology

## 2020-12-29 ENCOUNTER — Other Ambulatory Visit: Payer: Self-pay | Admitting: Physician Assistant

## 2021-01-01 ENCOUNTER — Other Ambulatory Visit: Payer: Self-pay | Admitting: Physician Assistant

## 2021-02-01 ENCOUNTER — Encounter: Payer: Self-pay | Admitting: Physician Assistant

## 2021-02-02 ENCOUNTER — Other Ambulatory Visit: Payer: Self-pay

## 2021-02-02 DIAGNOSIS — E785 Hyperlipidemia, unspecified: Secondary | ICD-10-CM

## 2021-02-02 MED ORDER — ROSUVASTATIN CALCIUM 5 MG PO TABS: 5.0000 mg | ORAL_TABLET | Freq: Every day | ORAL | 1 refills | Status: DC

## 2021-02-25 ENCOUNTER — Ambulatory Visit: Payer: Federal, State, Local not specified - PPO | Admitting: Physician Assistant

## 2021-02-25 ENCOUNTER — Other Ambulatory Visit: Payer: Self-pay

## 2021-02-25 ENCOUNTER — Encounter: Payer: Self-pay | Admitting: Physician Assistant

## 2021-02-25 VITALS — BP 109/61 | HR 79 | Temp 98.0°F | Ht 65.5 in | Wt 219.5 lb

## 2021-02-25 DIAGNOSIS — N1831 Chronic kidney disease, stage 3a: Secondary | ICD-10-CM

## 2021-02-25 DIAGNOSIS — E785 Hyperlipidemia, unspecified: Secondary | ICD-10-CM

## 2021-02-25 DIAGNOSIS — R410 Disorientation, unspecified: Secondary | ICD-10-CM

## 2021-02-25 DIAGNOSIS — I10 Essential (primary) hypertension: Secondary | ICD-10-CM

## 2021-02-25 DIAGNOSIS — N390 Urinary tract infection, site not specified: Secondary | ICD-10-CM

## 2021-02-25 DIAGNOSIS — R319 Hematuria, unspecified: Secondary | ICD-10-CM

## 2021-02-25 DIAGNOSIS — R7303 Prediabetes: Secondary | ICD-10-CM | POA: Diagnosis not present

## 2021-02-25 LAB — POCT URINALYSIS DIPSTICK
Bilirubin, UA: NEGATIVE
Glucose, UA: NEGATIVE
Ketones, UA: NEGATIVE
Nitrite, UA: NEGATIVE
Protein, UA: NEGATIVE
Spec Grav, UA: >=1.03 — AB (ref 1.010–1.025)
Urobilinogen, UA: 0.2 E.U./dL
pH, UA: 6 (ref 5.0–8.0)

## 2021-02-25 MED ORDER — CEPHALEXIN 500 MG PO CAPS: 500.0000 mg | ORAL_CAPSULE | Freq: Two times a day (BID) | ORAL | 0 refills | Status: DC

## 2021-02-25 NOTE — Patient Instructions (Signed)

## 2021-02-25 NOTE — Progress Notes (Signed)
Established Patient Office Visit  Subjective:  Patient ID: Dawn Cox, female    DOB: Oct 11, 1945  Age: 75 y.o. MRN: [id number]  CC:  Chief Complaint  Patient presents with   Hypertension   Hyperlipidemia    HPI Dawn Cox presents for follow up on hypertension, CKD, prediabetes and hyperlipidemia. Patient reports feeling disoriented which started yesterday. Also reports runny nose and sneezing which have been ongoing since Covid (had June 2022). Since Covid also reports shortness of breath with activity such as going upstairs. Denies chest pain, palpitations, dizziness, dysuria, abdominal pain, nausea, vomiting, fever, or syncope. Reports a fall about 2 weeks ago when coming down a step at movie theatre and fell on right side. Denies head injury or LOC.    HTN: Pt denies chest pain, palpitations, orthopnea or lower extremity swelling. Taking medication as directed without side effects. Checks BP at home and recent readings range 137-157/80-85.   HLD: Pt taking medication as directed without issues. Trying to follow a low fat diet.  CKD: Followed by nephrology. Requesting to have blood work for upcoming visit.  Prediabetes: Does report increased thirst. No increased urination. States has decreased some carbohydrates and sweets.  Past Medical History:  Diagnosis Date   Ankle fracture    right   Anxiety    Depression    Diverticulitis    Hyperlipidemia    Hypertension    Lupus ([place])    Retinal detachment    Retinal hemorrhage     Past Surgical History:  Procedure Laterality Date   ABDOMINAL HYSTERECTOMY     BREAST BIOPSY     BREAST SURGERY Right    cyst removal   CATARACT EXTRACTION Bilateral    EYE SURGERY Right    hemorrhagia of retina   HERNIA REPAIR     RETINAL DETACHMENT SURGERY      Family History  Problem Relation Age of Onset   Heart attack Mother    Liver cancer Father    Depression Daughter        suicide   Breast cancer Cousin    Colon cancer  Neg Hx    Stomach cancer Neg Hx    Esophageal cancer Neg Hx    Rectal cancer Neg Hx     Social History   Socioeconomic History   Marital status: Married    Spouse name: Theodora Blow   Number of children: 2   Years of education: Not on file   Highest education level: Not on file  Occupational History   Occupation: retired  Tobacco Use   Smoking status: Never   Smokeless tobacco: Never  Vaping Use   Vaping Use: Never used  Substance and Sexual Activity   Alcohol use: Yes    Comment: rarely   Drug use: No   Sexual activity: Not Currently  Other Topics Concern   Not on file  Social History Narrative   Not on file   Social Determinants of Health   Financial Resource Strain: Not on file  Food Insecurity: Not on file  Transportation Needs: Not on file  Physical Activity: Not on file  Stress: Not on file  Social Connections: Not on file  Intimate Partner Violence: Not on file    Outpatient Medications Prior to Visit  Medication Sig Dispense Refill   albuterol (VENTOLIN HFA) 108 (90 Base) MCG/ACT inhaler Inhale 1-2 puffs into the lungs every 6 (six) hours as needed for wheezing or shortness of breath. 1 each 0  amLODipine (NORVASC) 5 MG tablet TAKE 1 TABLET(5 MG) BY MOUTH DAILY 90 tablet 0   aspirin 325 MG tablet Take 325 mg by mouth once.      FLUoxetine (PROZAC) 40 MG capsule TAKE 1 CAPSULE(40 MG) BY MOUTH DAILY 90 capsule 0   glycopyrrolate (ROBINUL) 2 MG tablet TAKE 1 TABLET(2 MG) BY MOUTH TWICE DAILY 180 tablet 3   hydroxychloroquine (PLAQUENIL) 200 MG tablet Take 200 mg by mouth 2 (two) times daily.     Multiple Vitamins-Minerals (HAIR SKIN AND NAILS FORMULA PO) Take 1 tablet by mouth daily.     rosuvastatin (CRESTOR) 5 MG tablet Take 1 tablet (5 mg total) by mouth daily. 90 tablet 1   traZODone (DESYREL) 50 MG tablet TAKE 1/2 TO 1 TABLET(25 TO 50 MG) BY MOUTH AT BEDTIME AS NEEDED FOR SLEEP 30 tablet 1   Turmeric 500 MG CAPS Take 1,000 mg by mouth daily.     No  facility-administered medications prior to visit.    Allergies  Allergen Reactions   Aloe    Latex    Other     aryrthromycin- IBS Xray dye   Shellfish Allergy    Vibramycin [Doxycycline Calcium]     ROS Review of Systems Review of Systems:  A fourteen system review of systems was performed and found to be positive as per HPI.   Objective:    Physical Exam General:  cooperative, in no acute distress, non-toxic appearing Neuro:  Alert and oriented x3,  extra-ocular muscles intact, PERRL, horizontal nystagmus with peripheral gaze to the right noted  HEENT:  Normocephalic, atraumatic, neck supple Skin:  no gross rash, warm, pink. Abdomen: Soft, non-tender, non-distended, +BS, no CVA tenderness Cardiac:  RRR, S1 S2 Respiratory: CTA B/L w/o wheezing, crackles or rales, Not using accessory muscles, speaking in full sentences- unlabored. Vascular:  Ext warm, no cyanosis apprec.; cap RF less 2 sec. Psych:  No HI/SI, judgement and insight good, Euthymic mood. Full Affect.  BP 109/61   Pulse 79   Temp 98 F (36.7 C)   Ht 5' 5.5" (1.664 m)   Wt 219 lb 8 oz (99.6 kg)   SpO2 99%   BMI 35.97 kg/m  Wt Readings from Last 3 Encounters:  02/25/21 219 lb 8 oz (99.6 kg)  10/17/20 213 lb 8 oz (96.8 kg)  04/18/20 217 lb (98.4 kg)     Health Maintenance Due  Topic Date Due   COVID-19 Vaccine (3 - Booster for Pfizer series) 09/08/2019   Zoster Vaccines- Shingrix (2 of 2) 10/17/2019   INFLUENZA VACCINE  10/20/2020    There are no preventive care reminders to display for this patient.  Lab Results  Component Value Date   TSH 3.270 10/17/2020   Lab Results  Component Value Date   WBC 5.3 10/17/2020   HGB 13.1 10/17/2020   HCT 38.6 10/17/2020   MCV 87 10/17/2020   PLT 215 10/17/2020   Lab Results  Component Value Date   NA 143 10/17/2020   K 4.4 10/17/2020   CO2 24 10/17/2020   GLUCOSE 140 (H) 10/17/2020   BUN 14 10/17/2020   CREATININE 1.33 (H) 10/17/2020   BILITOT  0.3 10/17/2020   ALKPHOS 97 10/17/2020   AST 43 (H) 10/17/2020   ALT 54 (H) 10/17/2020   PROT 7.0 10/17/2020   ALBUMIN 4.3 10/17/2020   CALCIUM 9.6 10/17/2020   ANIONGAP 9 05/04/2017   EGFR 42 (L) 10/17/2020   GFR 46.43 (L) 10/26/2019   Lab  Results  Component Value Date   CHOL 147 10/17/2020   Lab Results  Component Value Date   HDL 57 10/17/2020   Lab Results  Component Value Date   LDLCALC 72 10/17/2020   Lab Results  Component Value Date   TRIG 97 10/17/2020   Lab Results  Component Value Date   CHOLHDL 2.6 10/17/2020   Lab Results  Component Value Date   HGBA1C 6.9 (H) 10/17/2020      Assessment & Plan:   Problem List Items Addressed This Visit       Genitourinary   CKD (chronic kidney disease) stage 3, GFR 30-59 ml/min   Relevant Orders   Renal Function Panel   Vitamin D (25 hydroxy)   PTH, intact (no Ca)     Other   Hyperlipidemia - Primary   Pre-diabetes   Other Visit Diagnoses     Hypertension, unspecified type       Relevant Orders   CBC w/Diff   Comp Met (CMET)   Disorientation       Relevant Orders   CBC w/Diff   CBC w/Diff   Comp Met (CMET)   TSH   POCT urinalysis dipstick (Completed)   Urine Culture   Urinary tract infection with hematuria, site unspecified       Relevant Medications   cephALEXin (KEFLEX) 500 MG capsule      Hypertension: -Blood pressure lower than baseline. Discussed adequate hydration.  -Will collect CMP to evaluate renal function and electrolytes. -Continue current medication regimen. -Will continue to monitor.  Hyperlipidemia: -Last lipid panel wnl's, LDL 66. -Continue current medication regimen.  -Will continue to monitor.  Prediabetes: -A1c has improved from 6.9 to 6.1, recommend to continue with monitoring carbohydrate and glucose intake. -Will continue to monitor.   CKD, stage 3, GFR 30-59 ml/min: -Followed by Nephrology. -Last CMP, Creatinine 1.33, eGFR 42. -Reviewed last consult  (10/15/2020) and will collect future lab orders detailed and forward to Dr. Neta Ehlers.  Disorientation: -Etiology unclear. Collected UA to evaluate for cystitis and results: trace of leukocytes and blood, negative for glucose, bilirubin, ketones, protein and nitrites, clear yellow appearance. Will send for urine culture and start empiric antibiotic therapy for possibly atypical cystitis presentation. Nystagmus noted during exam suggestive vertigo and likely also contributing to symptoms. Urine specific gravity >1.030 suggestive of dehydration. Recommend to stay well hydrated, at least 64 fl oz/day. Will also collect labs to evaluate for leukocytosis, metabolic or endocrine etiology. Patient is aware to seek immediate medical care if symptoms fail to improve. A&O x 3 with no focal deficits.    Meds ordered this encounter  Medications   cephALEXin (KEFLEX) 500 MG capsule    Sig: Take 1 capsule (500 mg total) by mouth 2 (two) times daily.    Dispense:  10 capsule    Refill:  0     Follow-up: Return in about 3 months (around 05/26/2021) for HTN, HLD.    Lorrene Reid, PA-C

## 2021-02-26 ENCOUNTER — Encounter: Payer: Self-pay | Admitting: Physician Assistant

## 2021-02-26 LAB — SPECIMEN STATUS REPORT

## 2021-02-27 LAB — CBC WITH DIFFERENTIAL/PLATELET
Basophils Absolute: 0 10*3/uL (ref 0.0–0.2)
Basos: 1 %
EOS (ABSOLUTE): 0.2 10*3/uL (ref 0.0–0.4)
Eos: 2 %
Hematocrit: 40.2 % (ref 34.0–46.6)
Hemoglobin: 13.1 g/dL (ref 11.1–15.9)
Immature Grans (Abs): 0 10*3/uL (ref 0.0–0.1)
Immature Granulocytes: 0 %
Lymphocytes Absolute: 4.2 10*3/uL — ABNORMAL HIGH (ref 0.7–3.1)
Lymphs: 60 %
MCH: 28.9 pg (ref 26.6–33.0)
MCHC: 32.6 g/dL (ref 31.5–35.7)
MCV: 89 fL (ref 79–97)
Monocytes Absolute: 0.5 10*3/uL (ref 0.1–0.9)
Monocytes: 7 %
Neutrophils Absolute: 2.1 10*3/uL (ref 1.4–7.0)
Neutrophils: 30 %
Platelets: 195 10*3/uL (ref 150–450)
RBC: 4.53 x10E6/uL (ref 3.77–5.28)
RDW: 13.1 % (ref 11.7–15.4)
WBC: 7 10*3/uL (ref 3.4–10.8)

## 2021-02-27 LAB — RENAL FUNCTION PANEL: Phosphorus: 3.9 mg/dL (ref 3.0–4.3)

## 2021-02-27 LAB — COMPREHENSIVE METABOLIC PANEL
ALT: 21 IU/L (ref 0–32)
AST: 28 IU/L (ref 0–40)
Albumin/Globulin Ratio: 1.6 (ref 1.2–2.2)
Albumin: 4.5 g/dL (ref 3.7–4.7)
Alkaline Phosphatase: 77 IU/L (ref 44–121)
BUN/Creatinine Ratio: 13 (ref 12–28)
BUN: 16 mg/dL (ref 8–27)
Bilirubin Total: 0.3 mg/dL (ref 0.0–1.2)
CO2: 22 mmol/L (ref 20–29)
Calcium: 9.6 mg/dL (ref 8.7–10.3)
Chloride: 99 mmol/L (ref 96–106)
Creatinine, Ser: 1.22 mg/dL — ABNORMAL HIGH (ref 0.57–1.00)
Globulin, Total: 2.8 g/dL (ref 1.5–4.5)
Glucose: 117 mg/dL — ABNORMAL HIGH (ref 70–99)
Potassium: 4.3 mmol/L (ref 3.5–5.2)
Sodium: 140 mmol/L (ref 134–144)
Total Protein: 7.3 g/dL (ref 6.0–8.5)
eGFR: 47 mL/min/{1.73_m2} — ABNORMAL LOW (ref >59)

## 2021-02-27 LAB — TSH: TSH: 2.89 u[IU]/mL (ref 0.450–4.500)

## 2021-02-27 LAB — VITAMIN D 25 HYDROXY (VIT D DEFICIENCY, FRACTURES): Vit D, 25-Hydroxy: 31.9 ng/mL (ref 30.0–100.0)

## 2021-02-27 LAB — PARATHYROID HORMONE, INTACT (NO CA): PTH: 48 pg/mL (ref 15–65)

## 2021-02-28 LAB — URINE CULTURE

## 2021-03-02 ENCOUNTER — Telehealth: Payer: Self-pay | Admitting: Physician Assistant

## 2021-03-02 NOTE — Telephone Encounter (Signed)
Patient left a message asking if she needs to continue the Keflex since she does not have an infection? [phone]

## 2021-03-03 NOTE — Telephone Encounter (Signed)
Called the patient and left a voicemail telling the patient it was okay to stop taking the keflex.

## 2021-03-03 NOTE — Telephone Encounter (Signed)
Notified pt. 

## 2021-03-18 ENCOUNTER — Other Ambulatory Visit: Payer: Self-pay | Admitting: Physician Assistant

## 2021-03-18 DIAGNOSIS — I10 Essential (primary) hypertension: Secondary | ICD-10-CM

## 2021-03-19 MED ORDER — AMLODIPINE BESYLATE 5 MG PO TABS: ORAL_TABLET | ORAL | 0 refills | Status: DC

## 2021-04-06 ENCOUNTER — Other Ambulatory Visit: Payer: Self-pay | Admitting: Physician Assistant

## 2021-04-07 MED ORDER — FLUOXETINE HCL 40 MG PO CAPS: 40.0000 mg | ORAL_CAPSULE | Freq: Every day | ORAL | 1 refills | Status: DC

## 2021-04-08 ENCOUNTER — Encounter: Payer: Self-pay | Admitting: Physician Assistant

## 2021-04-08 ENCOUNTER — Ambulatory Visit (INDEPENDENT_AMBULATORY_CARE_PROVIDER_SITE_OTHER): Payer: Federal, State, Local not specified - PPO | Admitting: Physician Assistant

## 2021-04-08 ENCOUNTER — Other Ambulatory Visit: Payer: Self-pay

## 2021-04-08 VITALS — Ht 65.0 in | Wt 214.0 lb

## 2021-04-08 DIAGNOSIS — Z20828 Contact with and (suspected) exposure to other viral communicable diseases: Secondary | ICD-10-CM | POA: Diagnosis not present

## 2021-04-08 DIAGNOSIS — R6889 Other general symptoms and signs: Secondary | ICD-10-CM | POA: Diagnosis not present

## 2021-04-08 NOTE — Patient Instructions (Signed)
Influenza, Adult °Influenza, also called "the flu," is a viral infection that mainly affects the respiratory tract. This includes the lungs, nose, and throat. The flu spreads easily from person to person (is contagious). It causes common cold symptoms, along with high fever and body aches. °What are the causes? °This condition is caused by the influenza virus. You can get the virus by: °Breathing in droplets that are in the air from an infected person's cough or sneeze. °Touching something that has the virus on it (has been contaminated) and then touching your mouth, nose, or eyes. °What increases the risk? °The following factors may make you more likely to get the flu: °Not washing or sanitizing your hands often. °Having close contact with many people during cold and flu season. °Touching your mouth, eyes, or nose without first washing or sanitizing your hands. °Not getting an annual flu shot. °You may have a higher risk for the flu, including serious problems, such as a lung infection (pneumonia), if you: °Are older than 65. °Are pregnant. °Have a weakened disease-fighting system (immune system). This includes people who have HIV or AIDS, are on chemotherapy, or are taking medicines that reduce (suppress) the immune system. °Have a long-term (chronic) illness, such as heart disease, kidney disease, diabetes, or lung disease. °Have a liver disorder. °Are severely overweight (morbidly obese). °Have anemia. °Have asthma. °What are the signs or symptoms? °Symptoms of this condition usually begin suddenly and last 4-14 days. These may include: °Fever and chills. °Headaches, body aches, or muscle aches. °Sore throat. °Cough. °Runny or stuffy (congested) nose. °Chest discomfort. °Poor appetite. °Weakness or fatigue. °Dizziness. °Nausea or vomiting. °How is this diagnosed? °This condition may be diagnosed based on: °Your symptoms and medical history. °A physical exam. °Swabbing your nose or throat and testing the fluid  for the influenza virus. °How is this treated? °If the flu is diagnosed early, you can be treated with antiviral medicine that is given by mouth (orally) or through an IV. This can help reduce how severe the illness is and how long it lasts. °Taking care of yourself at home can help relieve symptoms. Your health care provider may recommend: °Taking over-the-counter medicines. °Drinking plenty of fluids. °In many cases, the flu goes away on its own. If you have severe symptoms or complications, you may be treated in a hospital. °Follow these instructions at home: °Activity °Rest as needed and get plenty of sleep. °Stay home from work or school as told by your health care provider. Unless you are visiting your health care provider, avoid leaving home until your fever has been gone for 24 hours without taking medicine. °Eating and drinking °Take an oral rehydration solution (ORS). This is a drink that is sold at pharmacies and retail stores. °Drink enough fluid to keep your urine pale yellow. °Drink clear fluids in small amounts as you are able. Clear fluids include water, ice chips, fruit juice mixed with water, and low-calorie sports drinks. °Eat bland, easy-to-digest foods in small amounts as you are able. These foods include bananas, applesauce, rice, lean meats, toast, and crackers. °Avoid drinking fluids that contain a lot of sugar or caffeine, such as energy drinks, regular sports drinks, and soda. °Avoid alcohol. °Avoid spicy or fatty foods. °General instructions °  °Take over-the-counter and prescription medicines only as told by your health care provider. °Use a cool mist humidifier to add humidity to the air in your home. This can make it easier to breathe. °When using a cool mist humidifier,   clean it daily. Empty the water and replace it with clean water. °Cover your mouth and nose when you cough or sneeze. °Wash your hands with soap and water often and for at least 20 seconds, especially after you cough or  sneeze. If soap and water are not available, use alcohol-based hand sanitizer. °Keep all follow-up visits. This is important. °How is this prevented? ° °Get an annual flu shot. This is usually available in late summer, fall, or winter. Ask your health care provider when you should get your flu shot. °Avoid contact with people who are sick during cold and flu season. This is generally fall and winter. °Contact a health care provider if: °You develop new symptoms. °You have: °Chest pain. °Diarrhea. °A fever. °Your cough gets worse. °You produce more mucus. °You feel nauseous or you vomit. °Get help right away if you: °Develop shortness of breath or have difficulty breathing. °Have skin or nails that turn a bluish color. °Have severe pain or stiffness in your neck. °Develop a sudden headache or sudden pain in your face or ear. °Cannot eat or drink without vomiting. °These symptoms may represent a serious problem that is an emergency. Do not wait to see if the symptoms will go away. Get medical help right away. Call your local emergency services (911 in the [place] Do not drive yourself to the hospital. °Summary °Influenza, also called "the flu," is a viral infection that primarily affects your respiratory tract. °Symptoms of the flu usually begin suddenly and last 4-14 days. °Getting an annual flu shot is the best way to prevent getting the flu. °Stay home from work or school as told by your health care provider. Unless you are visiting your health care provider, avoid leaving home until your fever has been gone for 24 hours without taking medicine. °Keep all follow-up visits. This is important. °This information is not intended to replace advice given to you by your health care provider. Make sure you discuss any questions you have with your health care provider. °Document Revised: 10/26/2019 Document Reviewed: 10/26/2019 °Elsevier Patient Education © 2022 [employer] ° °

## 2021-04-08 NOTE — Progress Notes (Signed)
Telehealth office visit note for Dawn Reid, PA-C- at Primary Care at [facility]   I connected with current patient today by telephone and verified that I am speaking with the correct person    Location of the patient: Home  Location of the provider: Office - This visit type was conducted due to national recommendations for restrictions regarding the COVID-19 Pandemic (e.g. social distancing) in an effort to limit this patient's exposure and mitigate transmission in our community.    - No physical exam could be performed with this format, beyond that communicated to [place] by the patient/ family members as noted.   - Additionally my office staff/ schedulers were to discuss with the patient that there may be a monetary charge related to this service, depending on their medical insurance.  My understanding is that patient understood and consented to proceed.     _________________________________________________________________________________   History of Present Illness: Patient calls in with c/o runny nose, sneezing, headache, general weakness, mild nausea, body aches, and mild cough mostly at night x at least 5 days. Reports today is feeling better. Her headache has improved and is mild. Had diarrhea for a couple of days which has resolved. No fever, did have hot flashes and cold chills. Denies chest pain, shortness of breath, vomiting, sore throat or earache. Patient's husband tested positive for flu yesterday. Has been drinking tea and resting.      GAD 7 : Generalized Anxiety Score 02/25/2021 10/17/2020  Nervous, Anxious, on Edge 1 1  Control/stop worrying 1 1  Worry too much - different things 1 1  Trouble relaxing 1 1  Restless 0 0  Easily annoyed or irritable 1 1  Afraid - awful might happen 0 1  Total GAD 7 Score 5 6  Anxiety Difficulty Somewhat difficult Somewhat difficult    Depression screen [facility] 2/9 02/25/2021 10/17/2020 04/18/2020 12/04/2019 08/22/2019  Decreased  Interest 1 1 1  0 1  Down, Depressed, Hopeless 1 1 0 0 0  PHQ - 2 Score 2 2 1  0 1  Altered sleeping 1 1 1  0 1  Tired, decreased energy 2 2 1 1 2   Change in appetite 1 0 0 0 1  Feeling bad or failure about yourself  1 0 0 0 1  Trouble concentrating 1 0 0 0 0  Moving slowly or fidgety/restless 1 0 0 0 1  Suicidal thoughts 0 0 0 0 0  PHQ-9 Score 9 5 3 1 7   Difficult doing work/chores Somewhat difficult Somewhat difficult Somewhat difficult Not difficult at all Somewhat difficult  Some recent data might be hidden      Impression and Recommendations:     1. Flu-like symptoms   2. Exposure to the flu     Discussed with patient symptoms most likely related to influenza, especially with close exposure. Symptoms ongoing >5 days with recent improvement so reasonable to continue with supportive care, will defer oral antiviral. Advised to monitor for severe or worsening symptoms and should seek immediate medical care.    - As part of my medical decision making, I reviewed the following data within the [place] History obtained from pt /family, CMA notes reviewed and incorporated if applicable, Labs reviewed, Radiograph/ tests reviewed if applicable and OV notes from prior OV's with me, as well as any other specialists she/he has seen since seeing me last, were all reviewed and used in my medical decision making process today.    -  Additionally, when appropriate, discussion had with patient regarding our treatment plan, and their biases/concerns about that plan were used in my medical decision making today.    - The patient agreed with the plan and demonstrated an understanding of the instructions.   No barriers to understanding were identified.     - The patient was advised to call back or seek an in-person evaluation if the symptoms worsen or if the condition fails to improve as anticipated.   Return if symptoms worsen or fail to improve.    No orders of the defined types  were placed in this encounter.   No orders of the defined types were placed in this encounter.   Medications Discontinued During This Encounter  Medication Reason   cephALEXin (KEFLEX) 500 MG capsule Completed Course       Time spent on telephone encounter was 8 minutes.      The [place] was signed into law in 2016 which includes the topic of electronic health records.  This provides immediate access to information in MyChart.  This includes consultation notes, operative notes, office notes, lab results and pathology reports.  If you have any questions about what you read please let us know at your next visit or call us at the office.  We are right here with you.   __________________________________________________________________________________     Patient Care Team    Relationship Specialty Notifications Start End  Dawn Cox, [place] PCP - General Physician Assistant  07/10/19   Floyce Stakes, MD Referring Physician Gastroenterology  05/31/17    Comment: Office address [address], [place] [address]: phone [phone]  Tracie Harrier, MD Consulting Physician Nephrology  11/30/18      -Vitals obtained; medications/ allergies reconciled;  personal medical, social, Sx etc.histories were updated by CMA, reviewed by me and are reflected in chart   Patient Active Problem List   Diagnosis Date Noted   Dizziness 02/28/2019   Pre-diabetes 11/29/2018   Blood glucose elevated 08/29/2018   Abnormal urinalysis 04/03/2018   Falls 02/24/2018   CKD (chronic kidney disease) stage 3, GFR 30-59 ml/min 01/10/2018   Obesity (BMI 30-39.9) 01/10/2018   Depression 12/22/2017   Tremor 11/08/2017   Perioral dermatitis 06/22/2017   Diverticulitis 05/25/2017   Hx of systemic lupus erythematosus (SLE) ([place]) 05/25/2017   Other fatigue 05/25/2017   Cardiovascular disease 05/25/2017   Hyperlipidemia 05/25/2017   Healthcare maintenance 05/25/2017   Screening  for breast cancer 05/25/2017   Body aches 05/25/2017     Current Meds  Medication Sig   albuterol (VENTOLIN HFA) 108 (90 Base) MCG/ACT inhaler Inhale 1-2 puffs into the lungs every 6 (six) hours as needed for wheezing or shortness of breath.   amLODipine (NORVASC) 5 MG tablet TAKE 1 TABLET(5 MG) BY MOUTH DAILY   aspirin 325 MG tablet Take 325 mg by mouth once.    FLUoxetine (PROZAC) 40 MG capsule Take 1 capsule (40 mg total) by mouth daily.   glycopyrrolate (ROBINUL) 2 MG tablet TAKE 1 TABLET(2 MG) BY MOUTH TWICE DAILY   hydroxychloroquine (PLAQUENIL) 200 MG tablet Take 200 mg by mouth 2 (two) times daily.   Multiple Vitamins-Minerals (HAIR SKIN AND NAILS FORMULA PO) Take 1 tablet by mouth daily.   rosuvastatin (CRESTOR) 5 MG tablet Take 1 tablet (5 mg total) by mouth daily.   traZODone (DESYREL) 50 MG tablet TAKE 1/2 TO 1 TABLET(25 TO 50 MG) BY MOUTH AT BEDTIME AS NEEDED FOR SLEEP   Turmeric  500 MG CAPS Take 1,000 mg by mouth daily.   [DISCONTINUED] cephALEXin (KEFLEX) 500 MG capsule Take 1 capsule (500 mg total) by mouth 2 (two) times daily.     Allergies:  Allergies  Allergen Reactions   Aloe    Latex    Other     aryrthromycin- IBS Xray dye   Shellfish Allergy    Vibramycin [Doxycycline Calcium]      ROS:  See above HPI for pertinent positives and negatives   Objective:   Height 5\' 5"  (1.651 m), weight 214 lb (97.1 kg).  (if some vitals are omitted, this means that patient was UNABLE to obtain them.) General: A & O * 3; sounds in no acute distress, Respiratory: speaking in full sentences, no conversational dyspnea Psych: insight appears good, mood- appears full

## 2021-05-11 ENCOUNTER — Other Ambulatory Visit: Payer: Self-pay | Admitting: Physician Assistant

## 2021-05-11 DIAGNOSIS — E785 Hyperlipidemia, unspecified: Secondary | ICD-10-CM

## 2021-05-18 ENCOUNTER — Encounter: Payer: Self-pay | Admitting: Physician Assistant

## 2021-05-18 ENCOUNTER — Ambulatory Visit: Payer: Federal, State, Local not specified - PPO | Admitting: Physician Assistant

## 2021-05-18 ENCOUNTER — Other Ambulatory Visit: Payer: Self-pay

## 2021-05-18 VITALS — BP 126/73 | HR 84 | Temp 97.1°F | Ht 65.0 in | Wt 209.0 lb

## 2021-05-18 DIAGNOSIS — E1159 Type 2 diabetes mellitus with other circulatory complications: Secondary | ICD-10-CM | POA: Insufficient documentation

## 2021-05-18 DIAGNOSIS — G4700 Insomnia, unspecified: Secondary | ICD-10-CM

## 2021-05-18 DIAGNOSIS — R7303 Prediabetes: Secondary | ICD-10-CM

## 2021-05-18 DIAGNOSIS — I10 Essential (primary) hypertension: Secondary | ICD-10-CM | POA: Diagnosis not present

## 2021-05-18 DIAGNOSIS — E785 Hyperlipidemia, unspecified: Secondary | ICD-10-CM | POA: Diagnosis not present

## 2021-05-18 DIAGNOSIS — F32A Depression, unspecified: Secondary | ICD-10-CM

## 2021-05-18 DIAGNOSIS — M329 Systemic lupus erythematosus, unspecified: Secondary | ICD-10-CM

## 2021-05-18 MED ORDER — AMLODIPINE BESYLATE 5 MG PO TABS: ORAL_TABLET | ORAL | 1 refills | Status: DC

## 2021-05-18 NOTE — Assessment & Plan Note (Signed)
Followed by Rheumatology

## 2021-05-18 NOTE — Assessment & Plan Note (Signed)
-  Will repeat A1c. Discussed with patient last A1c at 6.9 which was in the diabetes range. Discussed low carbohydrate and glucose diet. Pending lab results will make treatment adjustments if indicated such as considering medication therapy.

## 2021-05-18 NOTE — Assessment & Plan Note (Signed)
-  Last lipid panel: HDL 57, LDL 72 -Continue current medication regimen. -Will repeat lipid panel and hepatic function.

## 2021-05-18 NOTE — Assessment & Plan Note (Signed)
-  Stable. -Discussed good sleep hygiene.  -Continue Trazodone as needed. -Will continue to monitor.

## 2021-05-18 NOTE — Progress Notes (Signed)
Established Patient Office Visit  Subjective:  Patient ID: Dawn Cox, female    DOB: [birth year]  Age: 76 y.o. MRN: [id number]  CC:  Chief Complaint  Patient presents with   Follow-up    HPI DELORIES MAURI presents for follow up on hypertension, hyperlipidemia, prediabetes and mood.  HTN: Pt denies chest pain, palpitations, dizziness, headache, or lower extremity swelling. Taking medication as directed without side effects. Pt continues to monitor sodium intake.   HLD: Pt taking medication as directed without issues. States limits red meat and fried foods. Likes to bake instead of fry.   Prediabetes: No increased thirst or urination. Reports has not been eating as much chocolate or candy as before.   Mood: Reports medication compliance. States mood has been stable. Denies mood fluctuations, SI/HI.   Insomnia: Reports takes Trazodone 50 mg once every few weeks for sleep which helps.   Depression screen [facility] 2/9 05/18/2021 02/25/2021 10/17/2020 04/18/2020 12/04/2019  Decreased Interest '1 1 1 1 ' 0  Down, Depressed, Hopeless '1 1 1 ' 0 0  PHQ - 2 Score '2 2 2 1 ' 0  Altered sleeping '1 1 1 1 ' 0  Tired, decreased energy '2 2 2 1 1  ' Change in appetite 1 1 0 0 0  Feeling bad or failure about yourself  1 1 0 0 0  Trouble concentrating 1 1 0 0 0  Moving slowly or fidgety/restless 1 1 0 0 0  Suicidal thoughts 0 0 0 0 0  PHQ-9 Score '9 9 5 3 1  ' Difficult doing work/chores Somewhat difficult Somewhat difficult Somewhat difficult Somewhat difficult Not difficult at all  Some recent data might be hidden     Past Medical History:  Diagnosis Date   Ankle fracture    right   Anxiety    Depression    Diverticulitis    Hyperlipidemia    Hypertension    Lupus ([place])    Retinal detachment    Retinal hemorrhage     Past Surgical History:  Procedure Laterality Date   ABDOMINAL HYSTERECTOMY     BREAST BIOPSY     BREAST SURGERY Right    cyst removal   CATARACT EXTRACTION Bilateral    EYE  SURGERY Right    hemorrhagia of retina   HERNIA REPAIR     RETINAL DETACHMENT SURGERY      Family History  Problem Relation Age of Onset   Heart attack Mother    Liver cancer Father    Depression Daughter        suicide   Breast cancer Cousin    Colon cancer Neg Hx    Stomach cancer Neg Hx    Esophageal cancer Neg Hx    Rectal cancer Neg Hx     Social History   Socioeconomic History   Marital status: Married    Spouse name: Theodora Blow   Number of children: 2   Years of education: Not on file   Highest education level: Not on file  Occupational History   Occupation: retired  Tobacco Use   Smoking status: Never   Smokeless tobacco: Never  Vaping Use   Vaping Use: Never used  Substance and Sexual Activity   Alcohol use: Yes    Comment: rarely   Drug use: No   Sexual activity: Not Currently  Other Topics Concern   Not on file  Social History Narrative   Not on file   Social Determinants of Health   Financial Resource Strain:  Not on file  Food Insecurity: Not on file  Transportation Needs: Not on file  Physical Activity: Not on file  Stress: Not on file  Social Connections: Not on file  Intimate Partner Violence: Not on file    Outpatient Medications Prior to Visit  Medication Sig Dispense Refill   albuterol (VENTOLIN HFA) 108 (90 Base) MCG/ACT inhaler Inhale 1-2 puffs into the lungs every 6 (six) hours as needed for wheezing or shortness of breath. 1 each 0   aspirin 325 MG tablet Take 325 mg by mouth once.      FLUoxetine (PROZAC) 40 MG capsule Take 1 capsule (40 mg total) by mouth daily. 90 capsule 1   glycopyrrolate (ROBINUL) 2 MG tablet TAKE 1 TABLET(2 MG) BY MOUTH TWICE DAILY 180 tablet 3   hydroxychloroquine (PLAQUENIL) 200 MG tablet Take 200 mg by mouth 2 (two) times daily.     Multiple Vitamins-Minerals (HAIR SKIN AND NAILS FORMULA PO) Take 1 tablet by mouth daily.     rosuvastatin (CRESTOR) 5 MG tablet TAKE 1 TABLET(5 MG) BY MOUTH DAILY 90 tablet 1    traZODone (DESYREL) 50 MG tablet TAKE 1/2 TO 1 TABLET(25 TO 50 MG) BY MOUTH AT BEDTIME AS NEEDED FOR SLEEP 30 tablet 1   Turmeric 500 MG CAPS Take 1,000 mg by mouth daily.     amLODipine (NORVASC) 5 MG tablet TAKE 1 TABLET(5 MG) BY MOUTH DAILY 90 tablet 0   No facility-administered medications prior to visit.    Allergies  Allergen Reactions   Aloe    Latex    Other     aryrthromycin- IBS Xray dye   Shellfish Allergy    Vibramycin [Doxycycline Calcium]     ROS Review of Systems Review of Systems:  A fourteen system review of systems was performed and found to be positive as per HPI.   Objective:    Physical Exam General:  Pleasant and cooperative, appropriate for stated age.  Neuro:  Alert and oriented,  extra-ocular muscles intact  HEENT:  Normocephalic, atraumatic, neck supple  Skin:  no gross rash, warm, pink. Cardiac:  RRR, S1 S2 Respiratory: CTA B/L  Vascular:  Ext warm, no cyanosis apprec.; cap RF less 2 sec. Psych:  No HI/SI, judgement and insight good, Euthymic mood. Full Affect.  BP 126/73    Pulse 84    Temp (!) 97.1 F (36.2 C)    Ht '5\' 5"'  (1.651 m)    Wt 209 lb (94.8 kg)    SpO2 98%    BMI 34.78 kg/m  Wt Readings from Last 3 Encounters:  05/18/21 209 lb (94.8 kg)  04/08/21 214 lb (97.1 kg)  02/25/21 219 lb 8 oz (99.6 kg)     Health Maintenance Due  Topic Date Due   COVID-19 Vaccine (3 - Booster for Pfizer series) 09/08/2019   Zoster Vaccines- Shingrix (2 of 2) 10/17/2019   INFLUENZA VACCINE  10/20/2020    There are no preventive care reminders to display for this patient.  Lab Results  Component Value Date   TSH 2.890 02/25/2021   Lab Results  Component Value Date   WBC 7.0 02/25/2021   HGB 13.1 02/25/2021   HCT 40.2 02/25/2021   MCV 89 02/25/2021   PLT 195 02/25/2021   Lab Results  Component Value Date   NA 140 02/25/2021   K 4.3 02/25/2021   CO2 22 02/25/2021   GLUCOSE 117 (H) 02/25/2021   BUN 16 02/25/2021   CREATININE 1.22  (H)  02/25/2021   BILITOT 0.3 02/25/2021   ALKPHOS 77 02/25/2021   AST 28 02/25/2021   ALT 21 02/25/2021   PROT 7.3 02/25/2021   ALBUMIN 4.5 02/25/2021   CALCIUM 9.6 02/25/2021   ANIONGAP 9 05/04/2017   EGFR 47 (L) 02/25/2021   GFR 46.43 (L) 10/26/2019   Lab Results  Component Value Date   CHOL 147 10/17/2020   Lab Results  Component Value Date   HDL 57 10/17/2020   Lab Results  Component Value Date   LDLCALC 72 10/17/2020   Lab Results  Component Value Date   TRIG 97 10/17/2020   Lab Results  Component Value Date   CHOLHDL 2.6 10/17/2020   Lab Results  Component Value Date   HGBA1C 6.9 (H) 10/17/2020      Assessment & Plan:   Problem List Items Addressed This Visit       Cardiovascular and Mediastinum   Hypertension   Relevant Medications   amLODipine (NORVASC) 5 MG tablet   Other Relevant Orders   Comp Met (CMET)   HgB A1c   Lipid Profile     Other   Depression (Chronic)    -Stable. -Continue current medication regimen. See med list. -Will continue to monitor.      Hx of systemic lupus erythematosus (SLE) ([place])    -Followed by Rheumatology.        Hyperlipidemia - Primary    -Last lipid panel: HDL 57, LDL 72 -Continue current medication regimen. -Will repeat lipid panel and hepatic function.      Relevant Medications   amLODipine (NORVASC) 5 MG tablet   Other Relevant Orders   Comp Met (CMET)   HgB A1c   Lipid Profile   Pre-diabetes    -Will repeat A1c. Discussed with patient last A1c at 6.9 which was in the diabetes range. Discussed low carbohydrate and glucose diet. Pending lab results will make treatment adjustments if indicated such as considering medication therapy.        Relevant Orders   Comp Met (CMET)   HgB A1c   Lipid Profile   Insomnia    -Stable. -Discussed good sleep hygiene.  -Continue Trazodone as needed. -Will continue to monitor.       Meds ordered this encounter  Medications   amLODipine (NORVASC) 5 MG  tablet    Sig: TAKE 1 TABLET(5 MG) BY MOUTH DAILY    Dispense:  90 tablet    Refill:  1    Order Specific Question:   Supervising Provider    Answer:   Beatrice Lecher D [[id number]]    Follow-up: Return in about 5 months (around 10/15/2021) for CPE.    Lorrene Reid, PA-C

## 2021-05-18 NOTE — Patient Instructions (Signed)
Postnasal Drip ?Postnasal drip is the feeling of mucus going down the back of your throat. Mucus is a slimy substance that moistens and cleans your nose and throat, as well as the air pockets in face bones near your forehead and cheeks (sinuses). Small amounts of mucus pass from your nose and sinuses down the back of your throat all the time. This is normal. When you produce too much mucus or the mucus gets too thick, you can feel it. ?Some common causes of postnasal drip include: ?Having more mucus because of: ?A cold or the flu. ?Allergies. ?Cold air. ?Certain medicines. ?Having more mucus that is thicker because of: ?A sinus or nasal infection. ?Dry air. ?A food allergy. ?Follow these instructions at home: ?Relieving discomfort ? ?Gargle with a salt-water mixture 3-4 times a day or as needed. To make a salt-water mixture, completely dissolve ?-1 tsp of salt in 1 cup of warm water. ?If the air in your home is dry, use a humidifier to add moisture to the air. ?Use a saline spray or container (neti pot) to flush out the nose (nasal irrigation). These methods can help clear away mucus and keep the nasal passages moist. ?General instructions ?Take over-the-counter and prescription medicines only as told by your health care provider. ?Follow instructions from your health care provider about eating or drinking restrictions. You may need to avoid caffeine. ?Avoid things that you know you are allergic to (allergens), like dust, mold, pollen, pets, or certain foods. ?Drink enough fluid to keep your urine pale yellow. ?Keep all follow-up visits as told by your health care provider. This is important. ?Contact a health care provider if: ?You have a fever. ?You have a sore throat. ?You have difficulty swallowing. ?You have headache. ?You have sinus pain. ?You have a cough that does not go away. ?The mucus from your nose becomes thick and is green or yellow in color. ?You have cold or flu symptoms that last more than 10  days. ?Summary ?Postnasal drip is the feeling of mucus going down the back of your throat. ?If your health care provider approves, use nasal irrigation or a nasal spray 2?4 times a day. ?Avoid things that you know you are allergic to (allergens), like dust, mold, pollen, pets, or certain foods. ?This information is not intended to replace advice given to you by your health care provider. Make sure you discuss any questions you have with your health care provider. ?Document Revised: 12/18/2019 Document Reviewed: 12/18/2019 ?Elsevier Patient Education ? 2022 [employer] ? ?

## 2021-05-18 NOTE — Assessment & Plan Note (Signed)
-  Stable.  ?-Continue current medication regimen. See med list. ?-Will continue to monitor. ?

## 2021-05-19 LAB — HEMOGLOBIN A1C
Est. average glucose Bld gHb Est-mCnc: 137 mg/dL
Hgb A1c MFr Bld: 6.4 % — ABNORMAL HIGH (ref 4.8–5.6)

## 2021-05-19 LAB — COMPREHENSIVE METABOLIC PANEL
ALT: 19 IU/L (ref 0–32)
AST: 25 IU/L (ref 0–40)
Albumin/Globulin Ratio: 1.6 (ref 1.2–2.2)
Albumin: 4.2 g/dL (ref 3.7–4.7)
Alkaline Phosphatase: 68 IU/L (ref 44–121)
BUN/Creatinine Ratio: 14 (ref 12–28)
BUN: 16 mg/dL (ref 8–27)
Bilirubin Total: 0.4 mg/dL (ref 0.0–1.2)
CO2: 24 mmol/L (ref 20–29)
Calcium: 9.4 mg/dL (ref 8.7–10.3)
Chloride: 105 mmol/L (ref 96–106)
Creatinine, Ser: 1.13 mg/dL — ABNORMAL HIGH (ref 0.57–1.00)
Globulin, Total: 2.6 g/dL (ref 1.5–4.5)
Glucose: 132 mg/dL — ABNORMAL HIGH (ref 70–99)
Potassium: 4.5 mmol/L (ref 3.5–5.2)
Sodium: 143 mmol/L (ref 134–144)
Total Protein: 6.8 g/dL (ref 6.0–8.5)
eGFR: 51 mL/min/{1.73_m2} — ABNORMAL LOW (ref >59)

## 2021-05-19 LAB — LIPID PANEL
Chol/HDL Ratio: 3 ratio (ref 0.0–4.4)
Cholesterol, Total: 161 mg/dL (ref 100–199)
HDL: 54 mg/dL (ref >39)
LDL Chol Calc (NIH): 90 mg/dL (ref 0–99)
Triglycerides: 94 mg/dL (ref 0–149)
VLDL Cholesterol Cal: 17 mg/dL (ref 5–40)

## 2021-05-25 ENCOUNTER — Other Ambulatory Visit: Payer: Self-pay

## 2021-05-25 ENCOUNTER — Ambulatory Visit (INDEPENDENT_AMBULATORY_CARE_PROVIDER_SITE_OTHER): Payer: Federal, State, Local not specified - PPO | Admitting: Nurse Practitioner

## 2021-05-25 ENCOUNTER — Encounter: Payer: Self-pay | Admitting: Nurse Practitioner

## 2021-05-25 VITALS — BP 120/73 | HR 73 | Temp 97.9°F | Ht 65.0 in | Wt 220.8 lb

## 2021-05-25 DIAGNOSIS — H6123 Impacted cerumen, bilateral: Secondary | ICD-10-CM | POA: Diagnosis not present

## 2021-05-25 DIAGNOSIS — H60503 Unspecified acute noninfective otitis externa, bilateral: Secondary | ICD-10-CM

## 2021-05-25 MED ORDER — OFLOXACIN 0.3 % OT SOLN: 5.0000 [drp] | Freq: Every day | OTIC | 0 refills | Status: DC

## 2021-05-25 NOTE — Progress Notes (Signed)
Established patient visit ? ? ?Patient: Dawn Cox   DOB: 07/30/1945   76 y.o. Female  MRN: [id number] ?Visit Date: 05/25/2021 ? ?Chief Complaint  ?Patient presents with  ? Ear Pain  ? ?Subjective  ?  ?Otalgia  ?There is pain in both ears. This is a new problem. The current episode started in the past 7 days. The problem has been gradually worsening. There has been no fever. Associated symptoms include hearing loss and rhinorrhea. Pertinent negatives include no abdominal pain, coughing, diarrhea, ear discharge, headaches, neck pain, rash, sore throat or vomiting. Associated symptoms comments: Nasal congestion . She has tried ear drops for the symptoms. The treatment provided no relief.   ? ? ?Medications: ?Outpatient Medications Prior to Visit  ?Medication Sig  ? albuterol (VENTOLIN HFA) 108 (90 Base) MCG/ACT inhaler Inhale 1-2 puffs into the lungs every 6 (six) hours as needed for wheezing or shortness of breath.  ? amLODipine (NORVASC) 5 MG tablet TAKE 1 TABLET(5 MG) BY MOUTH DAILY  ? aspirin 325 MG tablet Take 325 mg by mouth once.   ? FLUoxetine (PROZAC) 40 MG capsule Take 1 capsule (40 mg total) by mouth daily.  ? glycopyrrolate (ROBINUL) 2 MG tablet TAKE 1 TABLET(2 MG) BY MOUTH TWICE DAILY  ? hydroxychloroquine (PLAQUENIL) 200 MG tablet Take 200 mg by mouth 2 (two) times daily.  ? Multiple Vitamins-Minerals (HAIR SKIN AND NAILS FORMULA PO) Take 1 tablet by mouth daily.  ? rosuvastatin (CRESTOR) 5 MG tablet TAKE 1 TABLET(5 MG) BY MOUTH DAILY  ? traZODone (DESYREL) 50 MG tablet TAKE 1/2 TO 1 TABLET(25 TO 50 MG) BY MOUTH AT BEDTIME AS NEEDED FOR SLEEP  ? Turmeric 500 MG CAPS Take 1,000 mg by mouth daily.  ? ?No facility-administered medications prior to visit.  ? ? ?Review of Systems  ?Constitutional:  Negative for activity change, appetite change, chills, fatigue and fever.  ?HENT:  Positive for ear pain, hearing loss and rhinorrhea. Negative for congestion, ear discharge, postnasal drip, sinus pressure, sinus  pain, sneezing and sore throat.   ?Eyes: Negative.   ?Respiratory:  Negative for cough, chest tightness, shortness of breath and wheezing.   ?Cardiovascular:  Negative for chest pain and palpitations.  ?Gastrointestinal:  Negative for abdominal pain, constipation, diarrhea, nausea and vomiting.  ?Endocrine: Negative for cold intolerance, heat intolerance, polydipsia and polyuria.  ?Genitourinary:  Negative for dyspareunia, dysuria, flank pain, frequency and urgency.  ?Musculoskeletal:  Negative for arthralgias, back pain, myalgias and neck pain.  ?Skin:  Negative for rash.  ?Allergic/Immunologic: Negative for environmental allergies.  ?Neurological:  Negative for dizziness, weakness and headaches.  ?Hematological:  Negative for adenopathy.  ?Psychiatric/Behavioral:  The patient is not nervous/anxious.   ? ? Objective  ?  ? ?Today's Vitals  ? 05/25/21 1001  ?BP: 120/73  ?Pulse: 73  ?Temp: 97.9 ?F (36.6 ?C)  ?SpO2: 97%  ?Weight: 220 lb 12.8 oz (100.2 kg)  ?Height: 5\' 5"  (1.651 m)  ? ?Body mass index is 36.74 kg/m?.  ? ?Physical Exam ?Vitals and nursing note reviewed.  ?Constitutional:   ?   Appearance: Normal appearance. She is well-developed. She is obese.  ?HENT:  ?   Head: Normocephalic and atraumatic.  ?   Right Ear: Ear canal and external ear normal. Decreased hearing noted. There is impacted cerumen. Tympanic membrane is erythematous.  ?   Left Ear: Ear canal and external ear normal. Decreased hearing noted. There is impacted cerumen. Tympanic membrane is bulging.  ?   Nose:  Nose normal.  ?   Comments: Bilateral ear lavage performed in the office today with very little wax able to be removed from the canals. Patient tolerated the procedure well.  ?   Mouth/Throat:  ?   Mouth: Mucous membranes are moist.  ?   Pharynx: Oropharynx is clear.  ?Eyes:  ?   Extraocular Movements: Extraocular movements intact.  ?   Conjunctiva/sclera: Conjunctivae normal.  ?   Pupils: Pupils are equal, round, and reactive to light.   ?Cardiovascular:  ?   Rate and Rhythm: Normal rate and regular rhythm.  ?   Pulses: Normal pulses.  ?   Heart sounds: Normal heart sounds.  ?Pulmonary:  ?   Effort: Pulmonary effort is normal.  ?   Breath sounds: Normal breath sounds.  ?Abdominal:  ?   Palpations: Abdomen is soft.  ?Musculoskeletal:     ?   General: Normal range of motion.  ?   Cervical back: Normal range of motion and neck supple.  ?Lymphadenopathy:  ?   Cervical: No cervical adenopathy.  ?Skin: ?   General: Skin is warm and dry.  ?   Capillary Refill: Capillary refill takes less than 2 seconds.  ?Neurological:  ?   General: No focal deficit present.  ?   Mental Status: She is alert and oriented to person, place, and time.  ?Psychiatric:     ?   Mood and Affect: Mood normal.     ?   Behavior: Behavior normal.     ?   Thought Content: Thought content normal.     ?   Judgment: Judgment normal.  ?  ? ? Assessment & Plan  ?  ? ?1. Bilateral impacted cerumen ?Bilateral ear lavage performed during today's visit.  Very little wax was able to be removed.  Refer to ENT for further evaluation. ?- Ambulatory referral to ENT ? ?2. Acute otitis externa of both ears, unspecified type ?Start Floxin eardrops.  Place 5 drops into both ears daily for next 7 days.  Referral to ENT made today. ?- ofloxacin (FLOXIN OTIC) 0.3 % OTIC solution; Place 5 drops into both ears daily.  Dispense: 5 mL; Refill: 0  ? ?Return for prn worsening or persistent symptoms.  ?   ? ? ? ?Carlean Jews, NP  ?[facility] ?[phone] (phone) ?[phone] (fax) ? ?[facility] ?

## 2021-05-26 ENCOUNTER — Ambulatory Visit: Payer: Federal, State, Local not specified - PPO | Admitting: Nurse Practitioner

## 2021-05-31 DIAGNOSIS — H60503 Unspecified acute noninfective otitis externa, bilateral: Secondary | ICD-10-CM | POA: Insufficient documentation

## 2021-05-31 DIAGNOSIS — H6123 Impacted cerumen, bilateral: Secondary | ICD-10-CM | POA: Insufficient documentation

## 2021-07-08 ENCOUNTER — Encounter: Payer: Self-pay | Admitting: Nurse Practitioner

## 2021-07-13 ENCOUNTER — Telehealth: Payer: Self-pay | Admitting: Gastroenterology

## 2021-07-13 NOTE — Telephone Encounter (Signed)
Inbound call from patient stating she needs a pre authorization and refill for Robinul. Please advise.  ?

## 2021-07-13 NOTE — Telephone Encounter (Signed)
Patient needs a routine follow up visit, last seen 2020 ?

## 2021-07-21 ENCOUNTER — Encounter: Payer: Self-pay | Admitting: Physician Assistant

## 2021-07-21 ENCOUNTER — Ambulatory Visit: Payer: Federal, State, Local not specified - PPO | Admitting: Physician Assistant

## 2021-07-21 VITALS — BP 128/68 | HR 72 | Ht 65.0 in | Wt 221.4 lb

## 2021-07-21 DIAGNOSIS — Z86010 Personal history of colonic polyps: Secondary | ICD-10-CM

## 2021-07-21 DIAGNOSIS — K580 Irritable bowel syndrome with diarrhea: Secondary | ICD-10-CM | POA: Diagnosis not present

## 2021-07-21 MED ORDER — GLYCOPYRROLATE 2 MG PO TABS: ORAL_TABLET | ORAL | 4 refills | Status: DC

## 2021-07-21 NOTE — Progress Notes (Signed)
? ?Subjective:  ? ? Patient ID: Dawn Cox, female    DOB: September 03, 1945, 76 y.o.   MRN: [id number] ? ?HPI ?Dawn Cox is a pleasant 76 year old African-American female, established with Dr. Loletha Carrow who comes in today for follow-up of IBS-D and medication refill.  She was last seen in the office in 08/2018. ?She has long history of IBS-D which has been managed with Robinul Forte with good control.  She says she generally takes this once daily as twice daily dosing causes dry mouth etc.  With once daily dosing she will usually have 1-2 formed stools daily.  She says occasionally she will have a "flareup" with diarrhea but that is infrequent. ?No current complaints of abdominal pain, no changes in bowel habits, no melena or hematochezia. ? ?Prior notes relate that her [place], we do have a copy of that report in our records, no polyps found at that time, random biopsies were taken to rule out microscopic colitis and were negative.  Dr. Loletha Carrow indicated that 5-year interval colonoscopy would be indicated which would have been mid 2021. ?Today patient says she is fairly certain that she had a colonoscopy sooner than 2016 and she would like to review her records at home prior to scheduling for follow-up colonoscopy. ? ?Other medical problems include hypertension, chronic kidney disease, SLE with polyarthralgias, hyperlipidemia and depression. ? ?Review of Systems. Pertinent positive and negative review of systems were noted in the above HPI section.  All other review of systems was otherwise negative.  ? ?Outpatient Encounter Medications as of 07/21/2021  ?Medication Sig  ? albuterol (VENTOLIN HFA) 108 (90 Base) MCG/ACT inhaler Inhale 1-2 puffs into the lungs every 6 (six) hours as needed for wheezing or shortness of breath.  ? amLODipine (NORVASC) 5 MG tablet TAKE 1 TABLET(5 MG) BY MOUTH DAILY  ? aspirin 325 MG tablet Take 325 mg by mouth once.   ? FLUoxetine (PROZAC) 40 MG  capsule Take 1 capsule (40 mg total) by mouth daily.  ? hydroxychloroquine (PLAQUENIL) 200 MG tablet Take 200 mg by mouth 2 (two) times daily.  ? Multiple Vitamins-Minerals (HAIR SKIN AND NAILS FORMULA PO) Take 1 tablet by mouth daily.  ? rosuvastatin (CRESTOR) 5 MG tablet TAKE 1 TABLET(5 MG) BY MOUTH DAILY  ? Turmeric 500 MG CAPS Take 1,000 mg by mouth daily.  ? [DISCONTINUED] glycopyrrolate (ROBINUL) 2 MG tablet TAKE 1 TABLET(2 MG) BY MOUTH TWICE DAILY  ? glycopyrrolate (ROBINUL) 2 MG tablet TAKE 1 TABLET(2 MG) BY MOUTH TWICE DAILY  ? [DISCONTINUED] ofloxacin (FLOXIN OTIC) 0.3 % OTIC solution Place 5 drops into both ears daily.  ? [DISCONTINUED] traZODone (DESYREL) 50 MG tablet TAKE 1/2 TO 1 TABLET(25 TO 50 MG) BY MOUTH AT BEDTIME AS NEEDED FOR SLEEP  ? ?No facility-administered encounter medications on file as of 07/21/2021.  ? ?Allergies  ?Allergen Reactions  ? Aloe   ? Latex   ? Other   ?  aryrthromycin- IBS ?Xray dye  ? Shellfish Allergy   ? Vibramycin [Doxycycline Calcium]   ? ?Patient Active Problem List  ? Diagnosis Date Noted  ? Acute otitis externa of both ears 05/31/2021  ? Bilateral impacted cerumen 05/31/2021  ? Insomnia 05/18/2021  ? Hypertension 05/18/2021  ? Dizziness 02/28/2019  ? Pre-diabetes 11/29/2018  ? Blood glucose elevated 08/29/2018  ? Abnormal urinalysis 04/03/2018  ? Falls 02/24/2018  ? CKD (chronic kidney disease) stage 3, GFR 30-59 ml/min 01/10/2018  ? Obesity (BMI 30-39.9) 01/10/2018  ?  Depression 12/22/2017  ? Tremor 11/08/2017  ? Perioral dermatitis 06/22/2017  ? Diverticulitis 05/25/2017  ? Hx of systemic lupus erythematosus (SLE) ([place]) 05/25/2017  ? Other fatigue 05/25/2017  ? Cardiovascular disease 05/25/2017  ? Hyperlipidemia 05/25/2017  ? Healthcare maintenance 05/25/2017  ? Screening for breast cancer 05/25/2017  ? Body aches 05/25/2017  ? ?Social History  ? ?Socioeconomic History  ? Marital status: Married  ?  Spouse name: Theodora Blow  ? Number of children: 2  ? Years of  education: Not on file  ? Highest education level: Not on file  ?Occupational History  ? Occupation: retired  ?Tobacco Use  ? Smoking status: Never  ? Smokeless tobacco: Never  ?Vaping Use  ? Vaping Use: Never used  ?Substance and Sexual Activity  ? Alcohol use: Yes  ?  Comment: rarely  ? Drug use: No  ? Sexual activity: Not Currently  ?Other Topics Concern  ? Not on file  ?Social History Narrative  ? Not on file  ? ?Social Determinants of Health  ? ?Financial Resource Strain: Not on file  ?Food Insecurity: Not on file  ?Transportation Needs: Not on file  ?Physical Activity: Not on file  ?Stress: Not on file  ?Social Connections: Not on file  ?Intimate Partner Violence: Not on file  ? ? ?Dawn Cox's family history includes Breast cancer in her cousin; Depression in her daughter; Heart attack in her mother; Liver cancer in her father. ? ? ?   ?Objective:  ?  ?Vitals:  ? 07/21/21 1141  ?BP: 128/68  ?Pulse: 72  ?SpO2: 99%  ? ? ?Physical Exam Well-developed well-nourished older African-American female in no acute distress.  Height, Weight, 221 BMI 36.8 ? ?HEENT; nontraumatic normocephalic, EOMI, PE R LA, sclera anicteric. ?Oropharynx; not examined today ?Neck; supple, no JVD ?Cardiovascular; regular rate and rhythm with S1-S2, no murmur rub or gallop ?Pulmonary; Clear bilaterally ?Abdomen; soft, obese nontender, nondistended, no palpable mass or hepatosplenomegaly, bowel sounds are active ?Rectal; not done today ?Skin; benign exam, no jaundice rash or appreciable lesions ?Extremities; no clubbing cyanosis or edema skin warm and dry ?Neuro/Psych; alert and oriented x4, grossly nonfocal mood and affect appropriate  ? ? ? ?   ?Assessment & Plan:  ? ?#24 76 year old African-American female with long history of IBS-D, managed well with Robinul Forte 2 mg once daily. ?Generally having 1-2 formed stools per day with very infrequent "flareups" ? ?#2 history of colon polyps-type uncertain-Per prior notes last colonoscopy was done  in 2016/[place] .  We do have a copy of that report, no polyps at that time, random biopsies were taken and were negative for microscopic colitis. ?Per Dr. Loletha Carrow is indicated for 5-year interval follow-up which would have been 2021 ? ?Patient relates today that she is fairly certain she had a colonoscopy more recently than 2016 and would like to review her records at home, prior to making decision about scheduling follow-up colonoscopy. ?We discussed the rationale for interval follow-up colonoscopy. ? ?#3 history of SLE, with polyarthralgias ?#4 hypertension ?#5.  Chronic kidney disease ?#6.  Hyperlipidemia ? ?Plan :  Refill Robinul Forte 2 mg p.o. daily to twice daily x1 year ?I have asked patient to review her colonoscopy reports that she has at home including any path reports, and to call back and relay that information to Dr. Loletha Carrow  nurse.  If she has not had colonoscopy since 2016 and she will need to be scheduled for follow-up colonoscopy with Dr. Loletha Carrow.  She is  agreeable with this plan. ? ? ? ?Charvis Lightner S Charisse Wendell PA-C ?07/21/2021 ? ? ?Cc: Lorrene Reid, PA-C ?  ?

## 2021-07-21 NOTE — Progress Notes (Signed)
____________________________________________________________  Attending physician addendum:  Thank you for sending this case to me. I have reviewed the entire note and agree with the plan.   Rolin Schult Danis, MD  ____________________________________________________________  

## 2021-07-21 NOTE — Patient Instructions (Addendum)
If you are age 76 or older, your body mass index should be between 23-30. Your Body mass index is 36.84 kg/m?Marland Kitchen If this is out of the aforementioned range listed, please consider follow up with your Primary Care Provider. ?________________________________________________________ ? ?The [place] providers would like to encourage you to use [facility] to communicate with providers for non-urgent requests or questions.  Due to long hold times on the telephone, sending your provider a message by [facility] may be a faster and more efficient way to get a response.  Please allow 48 business hours for a response.  Please remember that this is for non-urgent requests.  ?_______________________________________________________ ? ?Refills of your Glycopyrrolate ? ?Please be sure to call the office once you find out when your last Colonoscopy was an discuss with Dr. Loletha Carrow' nurse . ? ?Thank you for entrusting me with your care and choosing [facility]. ? ?Amy Esterwood, PA-C ? ?

## 2021-08-07 NOTE — Patient Instructions (Signed)
Hypertension, Adult ?Hypertension is another name for high blood pressure. High blood pressure forces your heart to work harder to pump blood. This can cause problems over time. ?There are two numbers in a blood pressure reading. There is a top number (systolic) over a bottom number (diastolic). It is best to have a blood pressure that is below 120/80. ?What are the causes? ?The cause of this condition is not known. Some other conditions can lead to high blood pressure. ?What increases the risk? ?Some lifestyle factors can make you more likely to develop high blood pressure: ?Smoking. ?Not getting enough exercise or physical activity. ?Being overweight. ?Having too much fat, sugar, calories, or salt (sodium) in your diet. ?Drinking too much alcohol. ?Other risk factors include: ?Having any of these conditions: ?Heart disease. ?Diabetes. ?High cholesterol. ?Kidney disease. ?Obstructive sleep apnea. ?Having a family history of high blood pressure and high cholesterol. ?Age. The risk increases with age. ?Stress. ?What are the signs or symptoms? ?High blood pressure may not cause symptoms. Very high blood pressure (hypertensive crisis) may cause: ?Headache. ?Fast or uneven heartbeats (palpitations). ?Shortness of breath. ?Nosebleed. ?Vomiting or feeling like you may vomit (nauseous). ?Changes in how you see. ?Very bad chest pain. ?Feeling dizzy. ?Seizures. ?How is this treated? ?This condition is treated by making healthy lifestyle changes, such as: ?Eating healthy foods. ?Exercising more. ?Drinking less alcohol. ?Your doctor may prescribe medicine if lifestyle changes do not help enough and if: ?Your top number is above 130. ?Your bottom number is above 80. ?Your personal target blood pressure may vary. ?Follow these instructions at home: ?Eating and drinking ? ?If told, follow the DASH eating plan. To follow this plan: ?Fill one half of your plate at each meal with fruits and vegetables. ?Fill one fourth of your plate  at each meal with whole grains. Whole grains include whole-wheat pasta, brown rice, and whole-grain bread. ?Eat or drink low-fat dairy products, such as skim milk or low-fat yogurt. ?Fill one fourth of your plate at each meal with low-fat (lean) proteins. Low-fat proteins include fish, chicken without skin, eggs, beans, and tofu. ?Avoid fatty meat, cured and processed meat, or chicken with skin. ?Avoid pre-made or processed food. ?Limit the amount of salt in your diet to less than 1,500 mg each day. ?Do not drink alcohol if: ?Your doctor tells you not to drink. ?You are pregnant, may be pregnant, or are planning to become pregnant. ?If you drink alcohol: ?Limit how much you have to: ?0-1 drink a day for women. ?0-2 drinks a day for men. ?Know how much alcohol is in your drink. In the [place] one drink equals one 12 oz bottle of beer (355 mL), one 5 oz glass of wine (148 mL), or one 1? oz glass of hard liquor (44 mL). ?Lifestyle ? ?Work with your doctor to stay at a healthy weight or to lose weight. Ask your doctor what the best weight is for you. ?Get at least 30 minutes of exercise that causes your heart to beat faster (aerobic exercise) most days of the week. This may include walking, swimming, or biking. ?Get at least 30 minutes of exercise that strengthens your muscles (resistance exercise) at least 3 days a week. This may include lifting weights or doing Pilates. ?Do not smoke or use any products that contain nicotine or tobacco. If you need help quitting, ask your doctor. ?Check your blood pressure at home as told by your doctor. ?Keep all follow-up visits. ?Medicines ?Take over-the-counter and prescription medicines   only as told by your doctor. Follow directions carefully. ?Do not skip doses of blood pressure medicine. The medicine does not work as well if you skip doses. Skipping doses also puts you at risk for problems. ?Ask your doctor about side effects or reactions to medicines that you should watch  for. ?Contact a doctor if: ?You think you are having a reaction to the medicine you are taking. ?You have headaches that keep coming back. ?You feel dizzy. ?You have swelling in your ankles. ?You have trouble with your vision. ?Get help right away if: ?You get a very bad headache. ?You start to feel mixed up (confused). ?You feel weak or numb. ?You feel faint. ?You have very bad pain in your: ?Chest. ?Belly (abdomen). ?You vomit more than once. ?You have trouble breathing. ?These symptoms may be an emergency. Get help right away. Call 911. ?Do not wait to see if the symptoms will go away. ?Do not drive yourself to the hospital. ?Summary ?Hypertension is another name for high blood pressure. ?High blood pressure forces your heart to work harder to pump blood. ?For most people, a normal blood pressure is less than 120/80. ?Making healthy choices can help lower blood pressure. If your blood pressure does not get lower with healthy choices, you may need to take medicine. ?This information is not intended to replace advice given to you by your health care provider. Make sure you discuss any questions you have with your health care provider. ?Document Revised: 12/25/2020 Document Reviewed: 12/25/2020 ?Elsevier Patient Education ? 2023 [employer] ? ?

## 2021-08-11 ENCOUNTER — Ambulatory Visit (INDEPENDENT_AMBULATORY_CARE_PROVIDER_SITE_OTHER): Payer: Federal, State, Local not specified - PPO | Admitting: Physician Assistant

## 2021-08-11 ENCOUNTER — Encounter: Payer: Self-pay | Admitting: Physician Assistant

## 2021-08-11 VITALS — BP 101/62 | HR 83 | Temp 97.7°F | Ht 65.0 in | Wt 222.0 lb

## 2021-08-11 DIAGNOSIS — E785 Hyperlipidemia, unspecified: Secondary | ICD-10-CM

## 2021-08-11 DIAGNOSIS — F32A Depression, unspecified: Secondary | ICD-10-CM | POA: Diagnosis not present

## 2021-08-11 DIAGNOSIS — R7301 Impaired fasting glucose: Secondary | ICD-10-CM | POA: Diagnosis not present

## 2021-08-11 DIAGNOSIS — I10 Essential (primary) hypertension: Secondary | ICD-10-CM

## 2021-08-11 DIAGNOSIS — E119 Type 2 diabetes mellitus without complications: Secondary | ICD-10-CM

## 2021-08-11 NOTE — Assessment & Plan Note (Signed)
-  Controlled. ?-Continue current medication regimen. ?-Will collect CMP to monitor renal function and electrolytes. ?-Will continue to monitor. ?

## 2021-08-11 NOTE — Progress Notes (Signed)
Established patient visit   Patient: Dawn Cox   DOB: 11-21-1945   76 y.o. Female  MRN: [id number] Visit Date: 08/11/2021  Chief Complaint  Patient presents with   Follow-up   Subjective    HPI  Patient presents for chronic follow-up visit.   Prediabetes: Pt denies increased urination or thirst from baseline. Continues to work on limiting candy and chocolate. Tries to have sugar-free.   HTN: Pt denies chest pain, palpitations, dizziness or lower extremity swelling. Taking medication as directed without side effects. Checks BP at home and readings range <130/80.   HLD: Pt taking medication as directed without problems. States trying to lose weight. Has worked on portion control.   Mood: Patient reports medication compliance. Denies labile mood, SI/HI or severe anxiety.   Medications: Outpatient Medications Prior to Visit  Medication Sig   albuterol (VENTOLIN HFA) 108 (90 Base) MCG/ACT inhaler Inhale 1-2 puffs into the lungs every 6 (six) hours as needed for wheezing or shortness of breath.   amLODipine (NORVASC) 5 MG tablet TAKE 1 TABLET(5 MG) BY MOUTH DAILY   aspirin 325 MG tablet Take 325 mg by mouth once.    FLUoxetine (PROZAC) 40 MG capsule Take 1 capsule (40 mg total) by mouth daily.   glycopyrrolate (ROBINUL) 2 MG tablet TAKE 1 TABLET(2 MG) BY MOUTH TWICE DAILY   hydroxychloroquine (PLAQUENIL) 200 MG tablet Take 200 mg by mouth 2 (two) times daily.   Multiple Vitamins-Minerals (HAIR SKIN AND NAILS FORMULA PO) Take 1 tablet by mouth daily.   rosuvastatin (CRESTOR) 5 MG tablet TAKE 1 TABLET(5 MG) BY MOUTH DAILY   Turmeric 500 MG CAPS Take 1,000 mg by mouth daily.   No facility-administered medications prior to visit.    Review of Systems Review of Systems:  A fourteen system review of systems was performed and found to be positive as per HPI.  Last CBC Lab Results  Component Value Date   WBC 7.0 02/25/2021   HGB 13.1 02/25/2021   HCT 40.2 02/25/2021   MCV 89  02/25/2021   MCH 28.9 02/25/2021   RDW 13.1 02/25/2021   PLT 195 70/14/1030   Last metabolic panel Lab Results  Component Value Date   GLUCOSE 132 (H) 05/18/2021   NA 143 05/18/2021   K 4.5 05/18/2021   CL 105 05/18/2021   CO2 24 05/18/2021   BUN 16 05/18/2021   CREATININE 1.13 (H) 05/18/2021   EGFR 51 (L) 05/18/2021   CALCIUM 9.4 05/18/2021   PHOS 3.9 02/25/2021   PROT 6.8 05/18/2021   ALBUMIN 4.2 05/18/2021   LABGLOB 2.6 05/18/2021   AGRATIO 1.6 05/18/2021   BILITOT 0.4 05/18/2021   ALKPHOS 68 05/18/2021   AST 25 05/18/2021   ALT 19 05/18/2021   ANIONGAP 9 05/04/2017   Last lipids Lab Results  Component Value Date   CHOL 161 05/18/2021   HDL 54 05/18/2021   LDLCALC 90 05/18/2021   TRIG 94 05/18/2021   CHOLHDL 3.0 05/18/2021   Last hemoglobin A1c Lab Results  Component Value Date   HGBA1C 6.4 (H) 05/18/2021   Last thyroid functions Lab Results  Component Value Date   TSH 2.890 02/25/2021   Last vitamin D Lab Results  Component Value Date   VD25OH 31.9 02/25/2021   Last vitamin B12 and Folate No results found for: VITAMINB12, FOLATE   Objective    BP 101/62   Pulse 83   Temp 97.7 F (36.5 C)   Ht $R'5\' 5"'UJ$  (1.651 m)  Wt 222 lb (100.7 kg)   SpO2 96%   BMI 36.94 kg/m  BP Readings from Last 3 Encounters:  08/11/21 101/62  07/21/21 128/68  05/25/21 120/73   Wt Readings from Last 3 Encounters:  08/11/21 222 lb (100.7 kg)  07/21/21 221 lb 6.4 oz (100.4 kg)  05/25/21 220 lb 12.8 oz (100.2 kg)      08/11/2021    8:20 AM 05/25/2021   10:06 AM 05/18/2021   10:00 AM 02/25/2021   11:03 AM 10/17/2020   11:19 AM  Depression screen PHQ 2/9  Decreased Interest 0 1 1 1 1   Down, Depressed, Hopeless 0 1 1 1 1   PHQ - 2 Score 0 2 2 2 2   Altered sleeping 1 1 1 1 1   Tired, decreased energy 1 1 2 2 2   Change in appetite 0 1 1 1  0  Feeling bad or failure about yourself  0 1 1 1  0  Trouble concentrating 1 1 1 1  0  Moving slowly or fidgety/restless 1 1 1 1  0   Suicidal thoughts 0 1 0 0 0  PHQ-9 Score 4 9 9 9 5   Difficult doing work/chores Somewhat difficult  Somewhat difficult Somewhat difficult Somewhat difficult      05/25/2021   10:06 AM 02/25/2021   11:04 AM 10/17/2020   11:19 AM  GAD 7 : Generalized Anxiety Score  Nervous, Anxious, on Edge 1 1 1   Control/stop worrying 1 1 1   Worry too much - different things 1 1 1   Trouble relaxing 1 1 1   Restless 0 0 0  Easily annoyed or irritable 0 1 1  Afraid - awful might happen 0 0 1  Total GAD 7 Score 4 5 6   Anxiety Difficulty  Somewhat difficult Somewhat difficult      Physical Exam  General:  Well Developed, well nourished, appropriate for stated age.  Neuro:  Alert and oriented,  extra-ocular muscles intact  HEENT:  Normocephalic, atraumatic, neck supple  Skin:  no gross rash, warm, pink. Cardiac:  RRR, S1 S2 Respiratory: CTA B/L  Vascular:  Ext warm, no cyanosis apprec.; cap RF less 2 sec. No edema  Psych:  No HI/SI, judgement and insight good, Euthymic mood. Full Affect.   No results found for any visits on 08/11/21.  Assessment & Plan      Problem List Items Addressed This Visit       Cardiovascular and Mediastinum   Hypertension    -Controlled. Continue current medication regimen. Will collect CMP to monitor renal function and electrolytes. Will continue to monitor.       Relevant Orders   Comp Met (CMET)     Other   Depression (Chronic)    -PHQ-9 score at patient's baseline. Will continue current medication regimen. Will continue to monitor.       Hyperlipidemia - Primary    -Last lipid panel: HDL 57, LDL 90 -Will repeat lipid panel and collect hepatic function. -Continue current medication regimen. -Recommend to follow a heart healthy diet. -Will continue to monitor.       Relevant Orders   Comp Met (CMET)   Lipid Profile   Other Visit Diagnoses     Impaired fasting glucose       Relevant Orders   HgB A1c      Impaired fasting glucose: -Last  A1c 6.4, will repeat A1c today. Discussed with patient low carb/glucose diet. Will continue to monitor.  Return in about 4 months (around 12/12/2021) for [place].  Lorrene Reid, PA-C  [facility] at [facility] [phone] (phone) [phone] (fax)  [place]

## 2021-08-11 NOTE — Assessment & Plan Note (Signed)
-  Last lipid panel: HDL 57, LDL 90 -Will repeat lipid panel and collect hepatic function. -Continue current medication regimen. -Recommend to follow a heart healthy diet. -Will continue to monitor.

## 2021-08-11 NOTE — Assessment & Plan Note (Signed)
-  PHQ-9 score at patient's baseline. Will continue current medication regimen. Will continue to monitor.

## 2021-08-12 LAB — COMPREHENSIVE METABOLIC PANEL
ALT: 22 IU/L (ref 0–32)
AST: 24 IU/L (ref 0–40)
Albumin/Globulin Ratio: 1.8 (ref 1.2–2.2)
Albumin: 4.2 g/dL (ref 3.7–4.7)
Alkaline Phosphatase: 74 IU/L (ref 44–121)
BUN/Creatinine Ratio: 15 (ref 12–28)
BUN: 19 mg/dL (ref 8–27)
Bilirubin Total: <0.2 mg/dL (ref 0.0–1.2)
CO2: 25 mmol/L (ref 20–29)
Calcium: 9.6 mg/dL (ref 8.7–10.3)
Chloride: 103 mmol/L (ref 96–106)
Creatinine, Ser: 1.28 mg/dL — ABNORMAL HIGH (ref 0.57–1.00)
Globulin, Total: 2.4 g/dL (ref 1.5–4.5)
Glucose: 136 mg/dL — ABNORMAL HIGH (ref 70–99)
Potassium: 4.4 mmol/L (ref 3.5–5.2)
Sodium: 142 mmol/L (ref 134–144)
Total Protein: 6.6 g/dL (ref 6.0–8.5)
eGFR: 44 mL/min/{1.73_m2} — ABNORMAL LOW (ref >59)

## 2021-08-12 LAB — LIPID PANEL
Chol/HDL Ratio: 2.6 ratio (ref 0.0–4.4)
Cholesterol, Total: 137 mg/dL (ref 100–199)
HDL: 52 mg/dL (ref >39)
LDL Chol Calc (NIH): 64 mg/dL (ref 0–99)
Triglycerides: 117 mg/dL (ref 0–149)
VLDL Cholesterol Cal: 21 mg/dL (ref 5–40)

## 2021-08-12 LAB — HEMOGLOBIN A1C
Est. average glucose Bld gHb Est-mCnc: 140 mg/dL
Hgb A1c MFr Bld: 6.5 % — ABNORMAL HIGH (ref 4.8–5.6)

## 2021-08-18 ENCOUNTER — Telehealth: Payer: Self-pay | Admitting: Physician Assistant

## 2021-08-18 DIAGNOSIS — E119 Type 2 diabetes mellitus without complications: Secondary | ICD-10-CM

## 2021-08-18 MED ORDER — DAPAGLIFLOZIN PROPANEDIOL 5 MG PO TABS: 5.0000 mg | ORAL_TABLET | Freq: Every day | ORAL | 0 refills | Status: DC

## 2021-08-18 NOTE — Telephone Encounter (Signed)
Patient requesting refill of the [place]. Please advise.

## 2021-08-18 NOTE — Addendum Note (Signed)
Addended by: Sylvester Harder on: 08/18/2021 02:12 PM   Modules accepted: Orders

## 2021-08-19 MED ORDER — DAPAGLIFLOZIN PROPANEDIOL 5 MG PO TABS: 5.0000 mg | ORAL_TABLET | Freq: Every day | ORAL | 0 refills | Status: DC

## 2021-08-19 NOTE — Telephone Encounter (Signed)
Rx sent to pharmacy. AS, CMA 

## 2021-09-22 ENCOUNTER — Other Ambulatory Visit: Payer: Self-pay | Admitting: Physician Assistant

## 2021-10-06 ENCOUNTER — Encounter: Payer: Self-pay | Admitting: Physician Assistant

## 2021-12-15 ENCOUNTER — Ambulatory Visit: Payer: Federal, State, Local not specified - PPO

## 2021-12-16 ENCOUNTER — Ambulatory Visit: Payer: Federal, State, Local not specified - PPO | Admitting: Physician Assistant

## 2021-12-16 ENCOUNTER — Encounter: Payer: Self-pay | Admitting: Physician Assistant

## 2021-12-16 VITALS — BP 151/77 | HR 73 | Temp 97.8°F | Ht 65.5 in | Wt 217.0 lb

## 2021-12-16 DIAGNOSIS — N1831 Chronic kidney disease, stage 3a: Secondary | ICD-10-CM

## 2021-12-16 DIAGNOSIS — E119 Type 2 diabetes mellitus without complications: Secondary | ICD-10-CM

## 2021-12-16 DIAGNOSIS — R42 Dizziness and giddiness: Secondary | ICD-10-CM | POA: Diagnosis not present

## 2021-12-16 MED ORDER — DAPAGLIFLOZIN PROPANEDIOL 5 MG PO TABS: 5.0000 mg | ORAL_TABLET | Freq: Every day | ORAL | 0 refills | Status: DC

## 2021-12-16 MED ORDER — MECLIZINE HCL 25 MG PO TABS: 25.0000 mg | ORAL_TABLET | Freq: Two times a day (BID) | ORAL | 0 refills | Status: DC | PRN

## 2021-12-16 NOTE — Patient Instructions (Addendum)
Benign Positional Vertigo Vertigo is the feeling that you or your surroundings are moving when they are not. Benign positional vertigo is the most common form of vertigo. This is usually a harmless condition (benign). This condition is positional. This means that symptoms are triggered by certain movements and positions. This condition can be dangerous if it occurs while you are doing something that could cause harm to yourself or others. This includes activities such as driving or operating machinery. What are the causes? The inner ear has fluid-filled canals that help your brain sense movement and balance. When the fluid moves, the brain receives messages about your body's position. With benign positional vertigo, calcium crystals in the inner ear break free and disturb the inner ear area. This causes your brain to receive confusing messages about your body's position. What increases the risk? You are more likely to develop this condition if: You are a woman. You are 50 years of age or older. You have recently had a head injury. You have an inner ear disease. What are the signs or symptoms? Symptoms of this condition usually happen when you move your head or your eyes in different directions. Symptoms may start suddenly and usually last for less than a minute. They include: Loss of balance and falling. Feeling like you are spinning or moving. Feeling like your surroundings are spinning or moving. Nausea and vomiting. Blurred vision. Dizziness. Involuntary eye movement (nystagmus). Symptoms can be mild and cause only minor problems, or they can be severe and interfere with daily life. Episodes of benign positional vertigo may return (recur) over time. Symptoms may also improve over time. How is this diagnosed? This condition may be diagnosed based on: Your medical history. A physical exam of the head, neck, and ears. Positional tests to check for or stimulate vertigo. You may be asked to  turn your head and change positions, such as going from sitting to lying down. A health care provider will watch for symptoms of vertigo. You may be referred to a health care provider who specializes in ear, nose, and throat problems (ENT or otolaryngologist) or a provider who specializes in disorders of the nervous system (neurologist). How is this treated?  This condition may be treated in a session in which your health care provider moves your head in specific positions to help the displaced crystals in your inner ear move. Treatment for this condition may take several sessions. Surgery may be needed in severe cases, but this is rare. In some cases, benign positional vertigo may resolve on its own in 2-4 weeks. Follow these instructions at home: Safety Move slowly. Avoid sudden body or head movements or certain positions, as told by your health care provider. Avoid driving or operating machinery until your health care provider says it is safe. Avoid doing any tasks that would be dangerous to you or others if vertigo occurs. If you have trouble walking or keeping your balance, try using a cane for stability. If you feel dizzy or unstable, sit down right away. Return to your normal activities as told by your health care provider. Ask your health care provider what activities are safe for you. General instructions Take over-the-counter and prescription medicines only as told by your health care provider. Drink enough fluid to keep your urine pale yellow. Keep all follow-up visits. This is important. Contact a health care provider if: You have a fever. Your condition gets worse or you develop new symptoms. Your family or friends notice any behavioral changes.   You have nausea or vomiting that gets worse. You have numbness or a prickling and tingling sensation. Get help right away if you: Have difficulty speaking or moving. Are always dizzy or faint. Develop severe headaches. Have weakness in  your legs or arms. Have changes in your hearing or vision. Develop a stiff neck. Develop sensitivity to light. These symptoms may represent a serious problem that is an emergency. Do not wait to see if the symptoms will go away. Get medical help right away. Call your local emergency services (911 in the [place] Do not drive yourself to the hospital. Summary Vertigo is the feeling that you or your surroundings are moving when they are not. Benign positional vertigo is the most common form of vertigo. This condition is caused by calcium crystals in the inner ear that become displaced. This causes a disturbance in an area of the inner ear that helps your brain sense movement and balance. Symptoms include loss of balance and falling, feeling that you or your surroundings are moving, nausea and vomiting, and blurred vision. This condition can be diagnosed based on symptoms, a physical exam, and positional tests. Follow safety instructions as told by your health care provider and keep all follow-up visits. This is important. This information is not intended to replace advice given to you by your health care provider. Make sure you discuss any questions you have with your health care provider. Document Revised: 02/06/2020 Document Reviewed: 02/06/2020 Elsevier Patient Education  2023 Elsevier Inc.   Dizziness Dizziness is a common problem. It makes you feel unsteady or light-headed. You may feel like you are about to pass out (faint). Dizziness can lead to getting hurt if you stumble or fall. Dizziness can be caused by many things, including: Medicines. Not having enough water in your body (dehydration). Illness. Follow these instructions at home: Eating and drinking  Drink enough fluid to keep your pee (urine) pale yellow. This helps to keep you from getting dehydrated. Try to drink more clear fluids, such as water. Do not drink alcohol. Limit how much caffeine you drink or eat, if your doctor  tells you to do that. Limit how much salt (sodium) you drink or eat, if your doctor tells you to do that. Activity  Avoid making quick movements. Stand up slowly from sitting in a chair, and steady yourself until you feel okay. In the morning, first sit up on the side of the bed. When you feel okay, stand up slowly while you hold onto something. Do this until you know that your balance is okay. If you need to stand in one place for a long time, move your legs often. Tighten and relax the muscles in your legs while you are standing. Do not drive or use machinery if you feel dizzy. Avoid bending down if you feel dizzy. Place items in your home so you can reach them easily without leaning over. Lifestyle Do not smoke or use any products that contain nicotine or tobacco. If you need help quitting, ask your doctor. Try to lower your stress level. You can do this by using methods such as yoga or meditation. Talk with your doctor if you need help. General instructions Watch your dizziness for any changes. Take over-the-counter and prescription medicines only as told by your doctor. Talk with your doctor if you think that you are dizzy because of a medicine that you are taking. Tell a friend or a family member that you are feeling dizzy. If he or she  notices any changes in your behavior, have this person call your doctor. Keep all follow-up visits. Contact a doctor if: Your dizziness does not go away. Your dizziness or light-headedness gets worse. You feel like you may vomit (are nauseous). You have trouble hearing. You have new symptoms. You are unsteady on your feet. You feel like the room is spinning. You have neck pain or a stiff neck. You have a fever. Get help right away if: You vomit or have watery poop (diarrhea), and you cannot eat or drink anything. You have trouble: Talking. Walking. Swallowing. Using your arms, hands, or legs. You feel generally weak. You are not thinking  clearly, or you have trouble forming sentences. A friend or family member may notice this. You have: Chest pain. Pain in your belly (abdomen). Shortness of breath. Sweating. Your vision changes. You are bleeding. You have a very bad headache. These symptoms may be an emergency. Get help right away. Call your local emergency services (911 in the [place] Do not wait to see if the symptoms will go away. Do not drive yourself to the hospital. Summary Dizziness makes you feel unsteady or light-headed. You may feel like you are about to pass out (faint). Drink enough fluid to keep your pee (urine) pale yellow. Do not drink alcohol. Avoid making quick movements if you feel dizzy. Watch your dizziness for any changes. This information is not intended to replace advice given to you by your health care provider. Make sure you discuss any questions you have with your health care provider. Document Revised: 02/11/2020 Document Reviewed: 02/11/2020 Elsevier Patient Education  [place].

## 2021-12-16 NOTE — Progress Notes (Signed)
Established patient acute visit   Patient: Dawn Cox   DOB: May 13, 1945   76 y.o. Female  MRN: [id number] Visit Date: 12/16/2021  Chief Complaint  Patient presents with   Follow-up    Dizzy x 1 month   Subjective    HPI HPI     Follow-up    Additional comments: Dizzy x 1 month      Last edited by Adelfa Koh, CMA on 12/16/2021  9:00 AM.      Patient presents with c/o dizziness for 1 month. Dizziness occurs which seems to be triggered with movement and position changes. Dizziness lasts 30 minutes or less. No chest pain. Reports sometimes feels short of breath with dizziness. When feeling dizzy feels like she is going to pass out or room is spinning. Patient has chronic tinnitus. Denies starting new medications.    Medications: Outpatient Medications Prior to Visit  Medication Sig   albuterol (VENTOLIN HFA) 108 (90 Base) MCG/ACT inhaler Inhale 1-2 puffs into the lungs every 6 (six) hours as needed for wheezing or shortness of breath.   amLODipine (NORVASC) 5 MG tablet TAKE 1 TABLET(5 MG) BY MOUTH DAILY   aspirin 325 MG tablet Take 325 mg by mouth once.    FLUoxetine (PROZAC) 40 MG capsule TAKE 1 CAPSULE(40 MG) BY MOUTH DAILY   glycopyrrolate (ROBINUL) 2 MG tablet TAKE 1 TABLET(2 MG) BY MOUTH TWICE DAILY   hydroxychloroquine (PLAQUENIL) 200 MG tablet Take 200 mg by mouth 2 (two) times daily.   Multiple Vitamins-Minerals (HAIR SKIN AND NAILS FORMULA PO) Take 1 tablet by mouth daily.   rosuvastatin (CRESTOR) 5 MG tablet TAKE 1 TABLET(5 MG) BY MOUTH DAILY   Turmeric 500 MG CAPS Take 1,000 mg by mouth daily.   [DISCONTINUED] dapagliflozin propanediol (FARXIGA) 5 MG TABS tablet Take 1 tablet (5 mg total) by mouth daily before breakfast. (Patient not taking: Reported on 12/16/2021)   No facility-administered medications prior to visit.    Review of Systems Review of Systems:  A fourteen system review of systems was performed and found to be positive as per HPI.  Last  CBC Lab Results  Component Value Date   WBC 7.0 02/25/2021   HGB 13.1 02/25/2021   HCT 40.2 02/25/2021   MCV 89 02/25/2021   MCH 28.9 02/25/2021   RDW 13.1 02/25/2021   PLT 195 82/42/3536   Last metabolic panel Lab Results  Component Value Date   GLUCOSE 136 (H) 08/11/2021   NA 142 08/11/2021   K 4.4 08/11/2021   CL 103 08/11/2021   CO2 25 08/11/2021   BUN 19 08/11/2021   CREATININE 1.28 (H) 08/11/2021   EGFR 44 (L) 08/11/2021   CALCIUM 9.6 08/11/2021   PHOS 3.9 02/25/2021   PROT 6.6 08/11/2021   ALBUMIN 4.2 08/11/2021   LABGLOB 2.4 08/11/2021   AGRATIO 1.8 08/11/2021   BILITOT <0.2 08/11/2021   ALKPHOS 74 08/11/2021   AST 24 08/11/2021   ALT 22 08/11/2021   ANIONGAP 9 05/04/2017   Last lipids Lab Results  Component Value Date   CHOL 137 08/11/2021   HDL 52 08/11/2021   LDLCALC 64 08/11/2021   TRIG 117 08/11/2021   CHOLHDL 2.6 08/11/2021   Last hemoglobin A1c Lab Results  Component Value Date   HGBA1C 6.5 (H) 08/11/2021   Last thyroid functions Lab Results  Component Value Date   TSH 2.890 02/25/2021   Last vitamin D Lab Results  Component Value Date   VD25OH 31.9 02/25/2021  Objective    BP (!) 151/77   Pulse 73   Temp 97.8 F (36.6 C) (Temporal)   Ht 5' 5.5" (1.664 m)   Wt 217 lb (98.4 kg)   BMI 35.56 kg/m    Physical Exam  General:  Pleasant and cooperative, appropriate for stated age.  Neuro:  Alert and oriented,  extra-ocular muscles intact, nystagmus noted with peripheral gaze  HEENT:  Normocephalic, atraumatic, bilateral cerumen impaction, neck supple  Skin:  no gross rash, warm, pink. Cardiac:  RRR, S1 S2 Respiratory: CTA B/L  Vascular:  Ext warm, no cyanosis apprec.; cap RF less 2 sec. Psych:  No HI/SI, judgement and insight good, Euthymic mood. Full Affect.   No results found for any visits on 12/16/21.  Assessment & Plan     Discussed with patient various etiologies for dizziness. Orthostatics obtained and negative.  Mild nystagmus noted on exam so s/sx likely secondary to vertigo. Will trial meclizine 25 mg BID prn for dizziness. Bilateral cerumen also likely contributing to dizziness, advised to use debrox ear drops and ear lavage can be attempted at her follow-up visit. Will also collect labs for further evaluation. If dizziness fails to improve or worsen we can consider referral to neurology.   Patient reports was unable to get [place] due to insurance. Will resend rx and resubmit PA if needed. Patient with hx of T2DM and CKD.   Return in about 2 months (around 02/15/2022) for DM, HTN, HLD, ear lavage.        Lorrene Reid, PA-C  [facility] at [facility] [phone] (phone) [phone] (fax)  [place]

## 2021-12-20 LAB — COMPREHENSIVE METABOLIC PANEL
ALT: 27 IU/L (ref 0–32)
AST: 31 IU/L (ref 0–40)
Albumin/Globulin Ratio: 1.8 (ref 1.2–2.2)
Albumin: 4.2 g/dL (ref 3.8–4.8)
Alkaline Phosphatase: 86 IU/L (ref 44–121)
BUN/Creatinine Ratio: 14 (ref 12–28)
BUN: 18 mg/dL (ref 8–27)
Bilirubin Total: <0.2 mg/dL (ref 0.0–1.2)
CO2: 18 mmol/L — ABNORMAL LOW (ref 20–29)
Calcium: 9.3 mg/dL (ref 8.7–10.3)
Chloride: 102 mmol/L (ref 96–106)
Creatinine, Ser: 1.29 mg/dL — ABNORMAL HIGH (ref 0.57–1.00)
Globulin, Total: 2.4 g/dL (ref 1.5–4.5)
Glucose: 123 mg/dL — ABNORMAL HIGH (ref 70–99)
Potassium: 4.5 mmol/L (ref 3.5–5.2)
Sodium: 142 mmol/L (ref 134–144)
Total Protein: 6.6 g/dL (ref 6.0–8.5)
eGFR: 43 mL/min/{1.73_m2} — ABNORMAL LOW (ref >59)

## 2021-12-20 LAB — CBC WITH DIFFERENTIAL/PLATELET
Basophils Absolute: 0.1 10*3/uL (ref 0.0–0.2)
Basos: 1 %
EOS (ABSOLUTE): 0.2 10*3/uL (ref 0.0–0.4)
Eos: 3 %
Hematocrit: 37.3 % (ref 34.0–46.6)
Hemoglobin: 12.2 g/dL (ref 11.1–15.9)
Immature Grans (Abs): 0 10*3/uL (ref 0.0–0.1)
Immature Granulocytes: 0 %
Lymphocytes Absolute: 3.1 10*3/uL (ref 0.7–3.1)
Lymphs: 52 %
MCH: 28.9 pg (ref 26.6–33.0)
MCHC: 32.7 g/dL (ref 31.5–35.7)
MCV: 88 fL (ref 79–97)
Monocytes Absolute: 0.4 10*3/uL (ref 0.1–0.9)
Monocytes: 7 %
Neutrophils Absolute: 2.2 10*3/uL (ref 1.4–7.0)
Neutrophils: 37 %
Platelets: 251 10*3/uL (ref 150–450)
RBC: 4.22 x10E6/uL (ref 3.77–5.28)
RDW: 14.3 % (ref 11.7–15.4)
WBC: 6 10*3/uL (ref 3.4–10.8)

## 2021-12-20 LAB — HEMOGLOBIN A1C
Est. average glucose Bld gHb Est-mCnc: 137 mg/dL
Hgb A1c MFr Bld: 6.4 % — ABNORMAL HIGH (ref 4.8–5.6)

## 2021-12-22 ENCOUNTER — Encounter: Payer: Federal, State, Local not specified - PPO | Admitting: Physician Assistant

## 2021-12-26 ENCOUNTER — Other Ambulatory Visit: Payer: Self-pay | Admitting: Physician Assistant

## 2021-12-26 DIAGNOSIS — I10 Essential (primary) hypertension: Secondary | ICD-10-CM

## 2022-01-11 ENCOUNTER — Ambulatory Visit
Admission: RE | Admit: 2022-01-11 | Discharge: 2022-01-11 | Disposition: A | Discharge status: Home / Self Care | Payer: Federal, State, Local not specified - PPO | Source: Ambulatory Visit | Attending: Adult Health | Admitting: Adult Health

## 2022-01-19 ENCOUNTER — Other Ambulatory Visit: Payer: Self-pay | Admitting: Physician Assistant

## 2022-01-19 DIAGNOSIS — E785 Hyperlipidemia, unspecified: Secondary | ICD-10-CM

## 2022-03-24 ENCOUNTER — Other Ambulatory Visit: Payer: Self-pay

## 2022-03-24 MED ORDER — FLUOXETINE HCL 40 MG PO CAPS: ORAL_CAPSULE | ORAL | 0 refills | Status: DC

## 2022-03-26 ENCOUNTER — Ambulatory Visit: Payer: Federal, State, Local not specified - PPO | Admitting: Gastroenterology

## 2022-04-13 ENCOUNTER — Ambulatory Visit: Payer: Federal, State, Local not specified - PPO | Admitting: Gastroenterology

## 2022-04-23 ENCOUNTER — Encounter: Payer: Self-pay | Admitting: Gastroenterology

## 2022-04-23 ENCOUNTER — Ambulatory Visit: Payer: Federal, State, Local not specified - PPO | Admitting: Gastroenterology

## 2022-04-23 VITALS — BP 122/68 | HR 90 | Ht 65.0 in | Wt 215.0 lb

## 2022-04-23 DIAGNOSIS — K580 Irritable bowel syndrome with diarrhea: Secondary | ICD-10-CM | POA: Diagnosis not present

## 2022-04-23 NOTE — Patient Instructions (Addendum)
Call to schedule colonoscopy in April for May 2024  Follow up as needed _______________________________________________________  If your blood pressure at your visit was 140/90 or greater, please contact your primary care physician to follow up on this.  _______________________________________________________  If you are age 77 or older, your body mass index should be between 23-30. Your Body mass index is 35.78 kg/m. If this is out of the aforementioned range listed, please consider follow up with your Primary Care Provider.  If you are age 60 or younger, your body mass index should be between 19-25. Your Body mass index is 35.78 kg/m. If this is out of the aformentioned range listed, please consider follow up with your Primary Care Provider.   ________________________________________________________  The [place] [facility] providers would like to encourage you to use [facility] to communicate with providers for non-urgent requests or questions.  Due to long hold times on the telephone, sending your provider a message by [facility] may be a faster and more efficient way to get a response.  Please allow 48 business hours for a response.  Please remember that this is for non-urgent requests.   Thank you for entrusting me with your care and choosing [name].  Alonza Bogus PA-C

## 2022-04-23 NOTE — Progress Notes (Signed)
04/23/2022 Dawn Cox [id number] 1946-02-15   HISTORY OF PRESENT ILLNESS:  This is a pleasant 77 year old African-American female, established with Dr. Loletha Carrow who comes in today for follow-up of IBS-D.  She was last seen in the office in 07/2021 and was doing well at that time.  She has long history of IBS-D which has been managed with Robinul Forte with good control.  She says she generally takes this once daily as twice daily dosing causes dry mouth etc.  With once daily dosing she will usually have 1-2 formed stools daily.  She says occasionally she will have a "flareup" with diarrhea but that is infrequent.  No current complaints of abdominal pain, no changes in bowel habits, no melena or hematochezia.  Colonoscopy October 2013 in [place], [place] with a poor prep, so was rescheduled and had another 13 days later on January 17, 2012.  It was also incomplete with stool seen throughout the colon.  Diverticulosis was noted as well as internal and external hemorrhoids.  Colonoscopy February 2016 at [facility] colonoscopy was normal with normal terminal ileum and random colon biopsies, negative for microscopic colitis.  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   Past Medical History:  Diagnosis Date   Ankle fracture    right   Anxiety    Depression    Diverticulitis    Hyperlipidemia    Hypertension    Lupus ([place])    Retinal detachment    Retinal hemorrhage    Past Surgical History:  Procedure Laterality Date   ABDOMINAL HYSTERECTOMY     BREAST BIOPSY     BREAST SURGERY Right    cyst removal   CATARACT EXTRACTION Bilateral    CATARACT EXTRACTION     EYE SURGERY Right    hemorrhagia of retina   HERNIA REPAIR     RETINAL DETACHMENT SURGERY      reports that she has never smoked. She has never used smokeless tobacco. She reports current alcohol use. She reports that she does not use drugs. family history includes Breast cancer in her  cousin; Depression in her daughter; Heart attack in her mother; Liver cancer in her father. Allergies  Allergen Reactions   Aloe    Latex    Other     aryrthromycin- IBS Xray dye   Shellfish Allergy    Vibramycin [Doxycycline Calcium]       Outpatient Encounter Medications as of 04/23/2022  Medication Sig   albuterol (VENTOLIN HFA) 108 (90 Base) MCG/ACT inhaler Inhale 1-2 puffs into the lungs every 6 (six) hours as needed for wheezing or shortness of breath.   amLODipine (NORVASC) 5 MG tablet TAKE 1 TABLET(5 MG) BY MOUTH DAILY   aspirin 325 MG tablet Take 325 mg by mouth once.    dapagliflozin propanediol (FARXIGA) 5 MG TABS tablet Take 1 tablet (5 mg total) by mouth daily before breakfast.   FLUoxetine (PROZAC) 40 MG capsule TAKE 1 CAPSULE(40 MG) BY MOUTH DAILY   glycopyrrolate (ROBINUL) 2 MG tablet TAKE 1 TABLET(2 MG) BY MOUTH TWICE DAILY   hydroxychloroquine (PLAQUENIL) 200 MG tablet Take 200 mg by mouth 2 (two) times daily.   Multiple Vitamins-Minerals (HAIR SKIN AND NAILS FORMULA PO) Take 1 tablet by mouth daily.   rosuvastatin (CRESTOR) 5 MG tablet TAKE 1 TABLET(5 MG) BY MOUTH DAILY   Turmeric 500 MG CAPS Take 1,000 mg by mouth daily.   meclizine (ANTIVERT) 25 MG tablet Take 1 tablet (25  mg total) by mouth 2 (two) times daily as needed for dizziness. (Patient not taking: Reported on 04/23/2022)   No facility-administered encounter medications on file as of 04/23/2022.    REVIEW OF SYSTEMS  : All other systems reviewed and negative except where noted in the History of Present Illness.   PHYSICAL EXAM: BP 122/68   Pulse 90   Ht 5\' 5"  (6.659 m)   Wt 215 lb (97.5 kg)   SpO2 97%   BMI 35.78 kg/m  General: Well developed female in no acute distress Head: Normocephalic and atraumatic Eyes:  Sclerae anicteric, conjunctiva pink. Ears: Normal auditory acuity Lungs: Clear throughout to auscultation; no W/R/R. Heart: Regular rate and rhythm; no M/R/G. Abdomen: Soft, non-distended.   BS present.  Non-tender. Musculoskeletal: Symmetrical with no gross deformities  Skin: No lesions on visible extremities Extremities: No edema  Neurological: Alert oriented x 4, grossly non-focal Psychological:  Alert and cooperative. Normal mood and affect  ASSESSMENT AND PLAN: *IBS-D currently doing well with her Robinul 2 mg twice daily.  We discussed considering repeat colonoscopy as it has been 8 years.  Her colonoscopy report from 2016 in [place] was not extensive.  She would like to wait and have it done in May or June.  She will call back when she is ready to schedule.  ?  Need for 2-day bowel prep as she had 2 incomplete colonoscopies due to retained stool in 2013, although now she is followed for IBS-D.  Interestingly, biopsies from 04/2014 colonoscopy showed changes c/w melanosis coli.   CC:  Lorrene Reid, PA-C

## 2022-04-27 ENCOUNTER — Encounter: Payer: Self-pay | Admitting: Gastroenterology

## 2022-04-27 DIAGNOSIS — K580 Irritable bowel syndrome with diarrhea: Secondary | ICD-10-CM | POA: Insufficient documentation

## 2022-04-27 DIAGNOSIS — K589 Irritable bowel syndrome without diarrhea: Secondary | ICD-10-CM | POA: Insufficient documentation

## 2022-04-27 NOTE — Progress Notes (Signed)
____________________________________________________________  Attending physician addendum:  Thank you for sending this case to me. I have reviewed the entire note.  When you can, please discuss with me in more detail your reference about to the 2016 colonoscopy report.  If it is believed to been a complete exam to the cecum or terminal ileum with a good preparation and no polyps found, then she would not need another colonoscopy until 2026.  If there was some question about the prep quality, then it would be reasonable to proceed this year.  If so, then I prefer a split dose GoLytely bowel preparation.  Wilfrid Lund, MD  ____________________________________________________________

## 2022-05-03 LAB — HM DIABETES EYE EXAM

## 2022-05-04 ENCOUNTER — Encounter: Payer: Self-pay | Admitting: Family Medicine

## 2022-05-13 ENCOUNTER — Other Ambulatory Visit: Payer: Self-pay | Admitting: Nurse Practitioner

## 2022-06-05 ENCOUNTER — Other Ambulatory Visit: Payer: Self-pay | Admitting: Family Medicine

## 2022-06-07 ENCOUNTER — Encounter: Payer: Self-pay | Admitting: Family Medicine

## 2022-06-07 ENCOUNTER — Ambulatory Visit: Payer: Federal, State, Local not specified - PPO | Admitting: Family Medicine

## 2022-06-07 VITALS — BP 126/78 | HR 60 | Resp 18 | Ht 65.0 in | Wt 211.0 lb

## 2022-06-07 DIAGNOSIS — E1122 Type 2 diabetes mellitus with diabetic chronic kidney disease: Secondary | ICD-10-CM | POA: Diagnosis not present

## 2022-06-07 DIAGNOSIS — N1831 Chronic kidney disease, stage 3a: Secondary | ICD-10-CM

## 2022-06-07 DIAGNOSIS — I10 Essential (primary) hypertension: Secondary | ICD-10-CM | POA: Diagnosis not present

## 2022-06-07 DIAGNOSIS — G4700 Insomnia, unspecified: Secondary | ICD-10-CM | POA: Diagnosis not present

## 2022-06-07 DIAGNOSIS — M329 Systemic lupus erythematosus, unspecified: Secondary | ICD-10-CM

## 2022-06-07 DIAGNOSIS — E785 Hyperlipidemia, unspecified: Secondary | ICD-10-CM | POA: Diagnosis not present

## 2022-06-07 DIAGNOSIS — G250 Essential tremor: Secondary | ICD-10-CM

## 2022-06-07 LAB — POCT UA - MICROALBUMIN
Albumin/Creatinine Ratio, Urine, POC: <30
Creatinine, POC: 100 mg/dL
Microalbumin Ur, POC: 80 mg/L

## 2022-06-07 MED ORDER — AMLODIPINE BESYLATE 5 MG PO TABS: 5.0000 mg | ORAL_TABLET | Freq: Every day | ORAL | 1 refills | Status: DC

## 2022-06-07 MED ORDER — PROPRANOLOL HCL 20 MG PO TABS: ORAL_TABLET | ORAL | 2 refills | Status: DC

## 2022-06-07 MED ORDER — ROSUVASTATIN CALCIUM 5 MG PO TABS: ORAL_TABLET | ORAL | 1 refills | Status: DC

## 2022-06-07 MED ORDER — DAPAGLIFLOZIN PROPANEDIOL 5 MG PO TABS: 5.0000 mg | ORAL_TABLET | Freq: Every day | ORAL | 0 refills | Status: DC

## 2022-06-07 MED ORDER — TRAZODONE HCL 50 MG PO TABS: ORAL_TABLET | ORAL | 1 refills | Status: AC

## 2022-06-07 NOTE — Progress Notes (Signed)
Established Patient Office Visit  Subjective   Patient ID: Dawn Cox, female    DOB: 1945-12-20  Age: 77 y.o. MRN: [id number]  Chief Complaint  Patient presents with   Medical Management of Chronic Issues   Diabetes   Hyperlipidemia   Hypertension   Insomnia         HPI Dawn Cox is a 77 y.o. female presenting today for follow up of hypertension, hyperlipidemia, diabetes. Hypertension: Patient here for follow-up of elevated blood pressure.   Pt denies chest pain, SOB, dizziness, edema, syncope, fatigue or heart palpitations. Taking amlodipine, reports excellent compliance with treatment. Denies side effects. Hyperlipidemia: tolerating rosuvastatin well with no myalgias or significant side effects. The 10-year ASCVD risk score (Arnett DK, et al., 2019) is: 24.4% Diabetes: denies hypoglycemic events, wounds or sores that are not healing well, increased thirst or urination. Denies vision problems, eye exam 05/03/2022. Patient has not been checking glucose ranges at home as she is out of needles, but she is planning on getting a different device so that she does not need. Taking Wilder Glade as prescribed without any side effects.  Insomnia: Previously took trazodone 25 to 50 mg to help her sleep, she has not had it in some time but would like to restart it as she is having difficulty sleeping and it is affecting her energy levels throughout the day. Tremor: Patient has experienced a bilateral action tremor in her hands that has been gradually worsening over the past few years.  She notices it the most when she is doing activities like cooking or trying to pick something up, and her loved ones recommended that she bring it up at her appointment to discuss treatment options. ROS Negative unless otherwise noted in HPI   Objective:     BP 126/78 (BP Location: Left Arm, Patient Position: Sitting, Cuff Size: Large)   Pulse 60   Resp 18   Ht 5\' 5"  (1.651 m)   Wt 211 lb (95.7 kg)   SpO2  95%   BMI 35.11 kg/m   Physical Exam Constitutional:      General: She is not in acute distress.    Appearance: Normal appearance.  HENT:     Head: Normocephalic and atraumatic.  Cardiovascular:     Rate and Rhythm: Normal rate and regular rhythm.     Pulses: Normal pulses.          Dorsalis pedis pulses are 2+ on the right side and 2+ on the left side.       Posterior tibial pulses are 2+ on the right side and 2+ on the left side.     Heart sounds: No murmur heard.    No friction rub. No gallop.  Pulmonary:     Effort: Pulmonary effort is normal. No respiratory distress.     Breath sounds: No wheezing, rhonchi or rales.  Musculoskeletal:     Right foot: Normal range of motion. No deformity, bunion or foot drop.     Left foot: Normal range of motion. No deformity, bunion or foot drop.  Feet:     Right foot:     Skin integrity: Skin integrity normal.     Toenail Condition: Right toenails are normal.     Left foot:     Skin integrity: Skin integrity normal.     Toenail Condition: Left toenails are normal.  Skin:    General: Skin is warm and dry.  Neurological:     Mental Status: She is  alert and oriented to person, place, and time.    Results for orders placed or performed in visit on 06/07/22  POCT UA - Microalbumin  Result Value Ref Range   Microalbumin Ur, POC 80 mg/L   Creatinine, POC 100 mg/dL   Albumin/Creatinine Ratio, Urine, POC <30       Assessment & Plan:  Hypertension, unspecified type Assessment & Plan: Stable.  Continue amlodipine 5 mg daily.  Repeating CMP for electrolytes and renal function today.  Will continue to monitor.  Orders: -     CBC with Differential/Platelet; Future -     Comprehensive metabolic panel; Future -     amLODIPine Besylate; Take 1 tablet (5 mg total) by mouth daily.  Dispense: 90 tablet; Refill: 1  Hyperlipidemia, unspecified hyperlipidemia type Assessment & Plan: Last lipid panel: LDL 64, HDL 52.  Rechecking lipid panel  today.  Continue rosuvastatin 5 mg daily.  Will continue to monitor.  Orders: -     Lipid panel; Future -     Rosuvastatin Calcium; TAKE 1 TABLET(5 MG) BY MOUTH DAILY  Dispense: 90 tablet; Refill: 1  Type 2 diabetes mellitus with stage 3 chronic kidney disease, without long-term current use of insulin, unspecified whether stage 3a or 3b CKD ([place]) Assessment & Plan: Initially diagnosed with diabetes 10/17/2020 with A1c 6.9.  Most recent A1c at 6.4, will recheck today.  Continue Farxiga 5 mg daily unless lab results warrant change.  Will continue to monitor.  Diabetic foot exam completed today.  Orders: -     Hemoglobin A1c; Future -     POCT UA - Microalbumin -     Dapagliflozin Propanediol; Take 1 tablet (5 mg total) by mouth daily before breakfast.  Dispense: 90 tablet; Refill: 0  Insomnia, unspecified type Assessment & Plan: Having difficulty sleeping, restarting trazodone 25 to 50 mg nightly as needed for sleep.  Will continue to monitor.  Orders: -     traZODone HCl; TAKE 1/2 TO 1 TABLET(25 TO 50 MG) BY MOUTH AT BEDTIME AS NEEDED FOR SLEEP  Dispense: 30 tablet; Refill: 1  Essential tremor Assessment & Plan: Tremor since 2019 has been gradually worsening.  Classified as action tremor without rest component, bilateral in hands.  Checking CMP and TSH today to rule out physiologic tremor causes.  She has been taking fluoxetine since 1995 which can also be associated with enhanced physiologic tremor, but we discussed that this is less likely given that she has been on it for so long.  There are no neurologic signs other than tremors such as bradykinesia, altered gait, or ataxia.  Essential tremor is the most likely diagnosis and we will treat as such.  Starting trial of very low-dose of propranolol given decreased renal function and possible history of she is only using her albuterol a couple of times a year, but provided education to start propranolol immediately if it was causing difficulty  breathing.  If fails propranolol for tremor, will refer to neurology for further workup and options, primidone not a good option given her age.  Patient verbalized understanding and is agreeable to this plan.  Orders: -     TSH; Future -     Propranolol HCl; Take 1 tablet (20 mg total) by mouth up to 2 (two) times daily.  Dispense: 90 tablet; Refill: 2  Stage 3a chronic kidney disease ([place]) Assessment & Plan: Followed by nephrology.  Most recent CMP stable from last, rechecking CMP today.  Orders: -  Dapagliflozin Propanediol; Take 1 tablet (5 mg total) by mouth daily before breakfast.  Dispense: 90 tablet; Refill: 0  Hx of systemic lupus erythematosus (SLE) ([place]) Assessment & Plan: Followed by rheumatology, taking hydroxychloroquine 200 mg daily.   Return in about 3 months (around 09/07/2022) for follow-up on new medicine for tremor.    Velva Harman, PA

## 2022-06-07 NOTE — Assessment & Plan Note (Signed)
Followed by nephrology.  Most recent CMP stable from last, rechecking CMP today.

## 2022-06-07 NOTE — Patient Instructions (Addendum)
I am actually starting you on propranolol, as that is the better options for anyone who is over 77 years old.  We are starting at a very low dose, you can take it 1 or 2 times a day until I see you again.  If it does make it difficult to breathe at all, then stop taking it and we can discuss other options.

## 2022-06-07 NOTE — Assessment & Plan Note (Signed)
Followed by rheumatology, taking hydroxychloroquine 200 mg daily.

## 2022-06-07 NOTE — Assessment & Plan Note (Addendum)
Initially diagnosed with diabetes 10/17/2020 with A1c 6.9.  Most recent A1c at 6.4, will recheck today.  Continue Farxiga 5 mg daily unless lab results warrant change.  Will continue to monitor.  Diabetic foot exam completed today.

## 2022-06-07 NOTE — Assessment & Plan Note (Signed)
Having difficulty sleeping, restarting trazodone 25 to 50 mg nightly as needed for sleep.  Will continue to monitor.

## 2022-06-07 NOTE — Assessment & Plan Note (Signed)
Tremor since 2019 has been gradually worsening.  Classified as action tremor without rest component, bilateral in hands.  Checking CMP and TSH today to rule out physiologic tremor causes.  She has been taking fluoxetine since 1995 which can also be associated with enhanced physiologic tremor, but we discussed that this is less likely given that she has been on it for so long.  There are no neurologic signs other than tremors such as bradykinesia, altered gait, or ataxia.  Essential tremor is the most likely diagnosis and we will treat as such.  Starting trial of very low-dose of propranolol given decreased renal function and possible history of she is only using her albuterol a couple of times a year, but provided education to start propranolol immediately if it was causing difficulty breathing.  If fails propranolol for tremor, will refer to neurology for further workup and options, primidone not a good option given her age.  Patient verbalized understanding and is agreeable to this plan.

## 2022-06-07 NOTE — Assessment & Plan Note (Signed)
Stable.  Continue amlodipine 5 mg daily.  Repeating CMP for electrolytes and renal function today.  Will continue to monitor.

## 2022-06-07 NOTE — Assessment & Plan Note (Signed)
Last lipid panel: LDL 64, HDL 52.  Rechecking lipid panel today.  Continue rosuvastatin 5 mg daily.  Will continue to monitor.

## 2022-06-08 LAB — CBC WITH DIFFERENTIAL/PLATELET
Basophils Absolute: 0 10*3/uL (ref 0.0–0.2)
Basos: 1 %
EOS (ABSOLUTE): 0 10*3/uL (ref 0.0–0.4)
Eos: 1 %
Hematocrit: 40.9 % (ref 34.0–46.6)
Hemoglobin: 13.8 g/dL (ref 11.1–15.9)
Immature Grans (Abs): 0 10*3/uL (ref 0.0–0.1)
Immature Granulocytes: 0 %
Lymphocytes Absolute: 3 10*3/uL (ref 0.7–3.1)
Lymphs: 45 %
MCH: 29.2 pg (ref 26.6–33.0)
MCHC: 33.7 g/dL (ref 31.5–35.7)
MCV: 87 fL (ref 79–97)
Monocytes Absolute: 0.4 10*3/uL (ref 0.1–0.9)
Monocytes: 6 %
Neutrophils Absolute: 3.1 10*3/uL (ref 1.4–7.0)
Neutrophils: 47 %
Platelets: 193 10*3/uL (ref 150–450)
RBC: 4.72 x10E6/uL (ref 3.77–5.28)
RDW: 13.8 % (ref 11.7–15.4)
WBC: 6.5 10*3/uL (ref 3.4–10.8)

## 2022-06-08 LAB — COMPREHENSIVE METABOLIC PANEL
ALT: 23 IU/L (ref 0–32)
AST: 29 IU/L (ref 0–40)
Albumin/Globulin Ratio: 1.4 (ref 1.2–2.2)
Albumin: 4.3 g/dL (ref 3.8–4.8)
Alkaline Phosphatase: 79 IU/L (ref 44–121)
BUN/Creatinine Ratio: 16 (ref 12–28)
BUN: 20 mg/dL (ref 8–27)
Bilirubin Total: 0.5 mg/dL (ref 0.0–1.2)
CO2: 20 mmol/L (ref 20–29)
Calcium: 9.2 mg/dL (ref 8.7–10.3)
Chloride: 100 mmol/L (ref 96–106)
Creatinine, Ser: 1.29 mg/dL — ABNORMAL HIGH (ref 0.57–1.00)
Globulin, Total: 3 g/dL (ref 1.5–4.5)
Glucose: 102 mg/dL — ABNORMAL HIGH (ref 70–99)
Potassium: 4.3 mmol/L (ref 3.5–5.2)
Sodium: 139 mmol/L (ref 134–144)
Total Protein: 7.3 g/dL (ref 6.0–8.5)
eGFR: 43 mL/min/{1.73_m2} — ABNORMAL LOW (ref >59)

## 2022-06-08 LAB — LIPID PANEL
Chol/HDL Ratio: 2.6 ratio (ref 0.0–4.4)
Cholesterol, Total: 157 mg/dL (ref 100–199)
HDL: 61 mg/dL (ref >39)
LDL Chol Calc (NIH): 82 mg/dL (ref 0–99)
Triglycerides: 74 mg/dL (ref 0–149)
VLDL Cholesterol Cal: 14 mg/dL (ref 5–40)

## 2022-06-08 LAB — HEMOGLOBIN A1C

## 2022-06-13 ENCOUNTER — Encounter: Payer: Self-pay | Admitting: Family Medicine

## 2022-09-07 ENCOUNTER — Ambulatory Visit (INDEPENDENT_AMBULATORY_CARE_PROVIDER_SITE_OTHER): Payer: Federal, State, Local not specified - PPO | Admitting: Family Medicine

## 2022-09-07 ENCOUNTER — Encounter: Payer: Self-pay | Admitting: Family Medicine

## 2022-09-07 VITALS — BP 126/79 | HR 74 | Temp 97.4°F | Ht 65.0 in | Wt 215.0 lb

## 2022-09-07 DIAGNOSIS — G250 Essential tremor: Secondary | ICD-10-CM

## 2022-09-07 DIAGNOSIS — E1122 Type 2 diabetes mellitus with diabetic chronic kidney disease: Secondary | ICD-10-CM

## 2022-09-07 DIAGNOSIS — E7800 Pure hypercholesterolemia, unspecified: Secondary | ICD-10-CM

## 2022-09-07 DIAGNOSIS — I10 Essential (primary) hypertension: Secondary | ICD-10-CM | POA: Diagnosis not present

## 2022-09-07 DIAGNOSIS — Z7984 Long term (current) use of oral hypoglycemic drugs: Secondary | ICD-10-CM

## 2022-09-07 DIAGNOSIS — N1831 Chronic kidney disease, stage 3a: Secondary | ICD-10-CM

## 2022-09-07 DIAGNOSIS — R42 Dizziness and giddiness: Secondary | ICD-10-CM

## 2022-09-07 NOTE — Patient Instructions (Signed)
If your pain gets worse, please let your rheumatologist know so they can adjust your medication accordingly.

## 2022-09-07 NOTE — Assessment & Plan Note (Signed)
Patient continuing with trial of propranolol 20 mg up to twice daily, reasonable to refer to neurology for confirmation of diagnosis in addition to workup for dizziness.

## 2022-09-07 NOTE — Assessment & Plan Note (Signed)
Initially diagnosed with diabetes 10/17/2020 with A1c 6.9.  Most recent A1c at 6.4, ordered A1c at last office visit 3 months ago but for some reason it was canceled.  Repeating A1c with labs today.  Continue Farxiga 5 mg daily unless lab results warrant change.  Will continue to monitor.

## 2022-09-07 NOTE — Assessment & Plan Note (Signed)
Stable.  Continue amlodipine 5 mg daily.  Repeating CMP for electrolytes and renal function today.  Will continue to monitor. 

## 2022-09-07 NOTE — Assessment & Plan Note (Signed)
Last lipid panel: LDL 82, HDL 61.  Rechecking lipid panel today.  Continue rosuvastatin 5 mg daily.  Will continue to monitor.

## 2022-09-07 NOTE — Progress Notes (Signed)
Established Patient Office Visit  Subjective   Patient ID: DOLLIE WALDOCH, female    DOB: 01/18/1946  Age: 77 y.o. MRN: [id number]  Chief Complaint  Patient presents with   Tremors    HPI JOHNICE NUFFER is a 77 y.o. female presenting today for follow up of tremor, HTN, HLD, DM.  At previous appointment, complained of bilateral action tremor in hands gradually worsening over several years.  Most noticeable during activities like cooking or trying to pick something up.  Started low-dose of propranolol 20 mg up to twice daily.  She has only used it here and there, not every single day and feels that because of this it is too early to tell exactly how effective it is.  She states that she believes it may be helping some, but will need to give it more time to be sure.  She is still experiencing occasional dizziness as well, first complained about on 12/16/2021.  Trial meclizine 25 mg twice daily failed. Hypertension: Patient here for follow-up of elevated blood pressure.  Pt denies chest pain, SOB, dizziness, edema, syncope, fatigue or heart palpitations. Taking amlodipine, reports excellent compliance with treatment. Denies side effects. Hyperlipidemia: tolerating rosuvastatin well with no myalgias or significant side effects.  The 10-year ASCVD risk score (Arnett DK, et al., 2019) is: 29% Diabetes: denies hypoglycemic events, wounds or sores that are not healing well, increased thirst or urination. Denies vision problems, eye exam completed 05/03/2022. Taking Marcelline Deist as prescribed without any side effects.   ROS Negative unless otherwise noted in HPI   Objective:     BP 126/79   Pulse 74   Temp (!) 97.4 F (36.3 C) (Oral)   Ht 5\' 5"  (1.651 m)   Wt 215 lb (97.5 kg)   SpO2 97%   BMI 35.78 kg/m   Physical Exam Constitutional:      General: She is not in acute distress.    Appearance: Normal appearance.  HENT:     Head: Normocephalic and atraumatic.  Cardiovascular:     Rate and  Rhythm: Normal rate and regular rhythm.     Heart sounds: No murmur heard.    No friction rub. No gallop.  Pulmonary:     Effort: Pulmonary effort is normal. No respiratory distress.     Breath sounds: No wheezing, rhonchi or rales.  Skin:    General: Skin is warm and dry.  Neurological:     Mental Status: She is alert and oriented to person, place, and time.     Assessment & Plan:  Essential tremor Assessment & Plan: Patient continuing with trial of propranolol 20 mg up to twice daily, reasonable to refer to neurology for confirmation of diagnosis in addition to workup for dizziness.  Orders: -     CBC with Differential/Platelet; Future -     Comprehensive metabolic panel; Future -     Ambulatory referral to Neurology  Type 2 diabetes mellitus with stage 3a chronic kidney disease, without long-term current use of insulin ([facility]) Assessment & Plan: Initially diagnosed with diabetes 10/17/2020 with A1c 6.9.  Most recent A1c at 6.4, ordered A1c at last office visit 3 months ago but for some reason it was canceled.  Repeating A1c with labs today.  Continue Farxiga 5 mg daily unless lab results warrant change.  Will continue to monitor.    Orders: -     Hemoglobin A1c; Future  Hypertension, unspecified type Assessment & Plan: Stable.  Continue amlodipine 5 mg daily.  Repeating CMP for electrolytes and renal function today.  Will continue to monitor.  Orders: -     CBC with Differential/Platelet; Future -     Comprehensive metabolic panel; Future  Hypercholesterolemia Assessment & Plan: Last lipid panel: LDL 82, HDL 61.  Rechecking lipid panel today.  Continue rosuvastatin 5 mg daily.  Will continue to monitor.  Orders: -     Lipid panel; Future  Dizziness -     Ambulatory referral to Neurology  Recommended to follow-up with rheumatology if body aches do not improve over the next few weeks.  Return in about 4 months (around 01/07/2023) for follow-up for HTN, HLD, DM.     Melida Quitter, PA

## 2022-09-08 LAB — LIPID PANEL
Chol/HDL Ratio: 2.6 ratio (ref 0.0–4.4)
Cholesterol, Total: 154 mg/dL (ref 100–199)
HDL: 59 mg/dL (ref >39)
LDL Chol Calc (NIH): 79 mg/dL (ref 0–99)
Triglycerides: 88 mg/dL (ref 0–149)
VLDL Cholesterol Cal: 16 mg/dL (ref 5–40)

## 2022-09-08 LAB — CBC WITH DIFFERENTIAL/PLATELET
Basophils Absolute: 0 10*3/uL (ref 0.0–0.2)
Basos: 1 %
EOS (ABSOLUTE): 0.2 10*3/uL (ref 0.0–0.4)
Eos: 3 %
Hematocrit: 39.7 % (ref 34.0–46.6)
Hemoglobin: 13.1 g/dL (ref 11.1–15.9)
Immature Grans (Abs): 0 10*3/uL (ref 0.0–0.1)
Immature Granulocytes: 0 %
Lymphocytes Absolute: 2.7 10*3/uL (ref 0.7–3.1)
Lymphs: 50 %
MCH: 29.2 pg (ref 26.6–33.0)
MCHC: 33 g/dL (ref 31.5–35.7)
MCV: 89 fL (ref 79–97)
Monocytes Absolute: 0.4 10*3/uL (ref 0.1–0.9)
Monocytes: 7 %
Neutrophils Absolute: 2.2 10*3/uL (ref 1.4–7.0)
Neutrophils: 39 %
Platelets: 220 10*3/uL (ref 150–450)
RBC: 4.48 x10E6/uL (ref 3.77–5.28)
RDW: 13.6 % (ref 11.7–15.4)
WBC: 5.5 10*3/uL (ref 3.4–10.8)

## 2022-09-08 LAB — COMPREHENSIVE METABOLIC PANEL
ALT: 21 IU/L (ref 0–32)
AST: 28 IU/L (ref 0–40)
Albumin: 4.1 g/dL (ref 3.8–4.8)
Alkaline Phosphatase: 74 IU/L (ref 44–121)
BUN/Creatinine Ratio: 14 (ref 12–28)
BUN: 21 mg/dL (ref 8–27)
Bilirubin Total: 0.3 mg/dL (ref 0.0–1.2)
CO2: 24 mmol/L (ref 20–29)
Calcium: 9.7 mg/dL (ref 8.7–10.3)
Chloride: 102 mmol/L (ref 96–106)
Creatinine, Ser: 1.46 mg/dL — ABNORMAL HIGH (ref 0.57–1.00)
Globulin, Total: 2.6 g/dL (ref 1.5–4.5)
Glucose: 114 mg/dL — ABNORMAL HIGH (ref 70–99)
Potassium: 4.1 mmol/L (ref 3.5–5.2)
Sodium: 140 mmol/L (ref 134–144)
Total Protein: 6.7 g/dL (ref 6.0–8.5)
eGFR: 37 mL/min/{1.73_m2} — ABNORMAL LOW (ref >59)

## 2022-09-08 LAB — HEMOGLOBIN A1C
Est. average glucose Bld gHb Est-mCnc: 131 mg/dL
Hgb A1c MFr Bld: 6.2 % — ABNORMAL HIGH (ref 4.8–5.6)

## 2022-09-08 LAB — TSH: TSH: 3.92 u[IU]/mL (ref 0.450–4.500)

## 2022-09-10 ENCOUNTER — Encounter: Payer: Self-pay | Admitting: Neurology

## 2022-10-11 ENCOUNTER — Other Ambulatory Visit: Payer: Self-pay | Admitting: Family Medicine

## 2022-10-11 DIAGNOSIS — N1831 Chronic kidney disease, stage 3a: Secondary | ICD-10-CM

## 2022-10-11 DIAGNOSIS — E1122 Type 2 diabetes mellitus with diabetic chronic kidney disease: Secondary | ICD-10-CM

## 2022-10-18 ENCOUNTER — Other Ambulatory Visit: Payer: Self-pay | Admitting: Physician Assistant

## 2022-10-20 ENCOUNTER — Ambulatory Visit: Payer: Federal, State, Local not specified - PPO | Admitting: Neurology

## 2022-11-17 ENCOUNTER — Encounter: Payer: Self-pay | Admitting: Family Medicine

## 2022-11-17 DIAGNOSIS — B3731 Acute candidiasis of vulva and vagina: Secondary | ICD-10-CM

## 2022-11-17 MED ORDER — FLUCONAZOLE 150 MG PO TABS
150.0000 mg | ORAL_TABLET | Freq: Once | ORAL | 0 refills | Status: DC
Start: 2022-11-17 — End: 2022-11-18

## 2022-11-17 NOTE — Addendum Note (Signed)
Addended by: Saralyn Pilar on: 11/17/2022 05:05 PM   Modules accepted: Orders

## 2022-11-18 MED ORDER — FLUCONAZOLE 150 MG PO TABS
150.0000 mg | ORAL_TABLET | Freq: Once | ORAL | 0 refills | Status: AC
Start: 2022-11-18 — End: 2022-11-18

## 2022-11-18 NOTE — Addendum Note (Signed)
Addended by: Tonny Bollman on: 11/18/2022 08:01 AM   Modules accepted: Orders

## 2022-11-23 ENCOUNTER — Other Ambulatory Visit: Payer: Self-pay | Admitting: Family Medicine

## 2022-11-25 ENCOUNTER — Other Ambulatory Visit: Payer: Self-pay | Admitting: Family Medicine

## 2022-11-25 DIAGNOSIS — I10 Essential (primary) hypertension: Secondary | ICD-10-CM

## 2023-01-19 ENCOUNTER — Telehealth: Payer: Self-pay

## 2023-01-19 DIAGNOSIS — B3731 Acute candidiasis of vulva and vagina: Secondary | ICD-10-CM

## 2023-01-19 MED ORDER — FLUCONAZOLE 150 MG PO TABS
150.0000 mg | ORAL_TABLET | Freq: Once | ORAL | 0 refills | Status: AC
Start: 2023-01-19 — End: 2023-01-19

## 2023-01-19 NOTE — Telephone Encounter (Signed)
Pt scheduled an appt for Nov 11 for generalize not feeling well. Pt also has a yeast infection and is requesting Rx for treatment for that.   Pharmacy: [id number] - [place], [place] - [address] AT [facility]

## 2023-01-19 NOTE — Telephone Encounter (Signed)
Meds ordered this encounter  Medications   fluconazole (DIFLUCAN) 150 MG tablet    Sig: Take 1 tablet (150 mg total) by mouth once for 1 dose.    Dispense:  1 tablet    Refill:  0    Order Specific Question:   Supervising Provider    Answer:   Sandre Kitty [[id number]]

## 2023-01-19 NOTE — Addendum Note (Signed)
Addended by: Saralyn Pilar on: 01/19/2023 03:20 PM   Modules accepted: Orders

## 2023-01-27 ENCOUNTER — Telehealth: Payer: Self-pay

## 2023-01-27 DIAGNOSIS — I10 Essential (primary) hypertension: Secondary | ICD-10-CM

## 2023-01-27 IMAGING — MG MM DIGITAL SCREENING BILAT W/ TOMO AND CAD
8 series · 8 of 24 positions shown · non-contrast
Comparison: Previous exam(s).

ACR Breast Density Category a: The breast tissue is almost entirely
fatty.

CLINICAL DATA: Screening.

EXAM:
DIGITAL SCREENING BILATERAL MAMMOGRAM WITH TOMOSYNTHESIS AND CAD
TECHNIQUE: Bilateral screening digital craniocaudal and mediolateral oblique
mammograms were obtained. Bilateral screening digital breast
tomosynthesis was performed. The images were evaluated with
computer-aided detection.

[L MLO synth-2D]
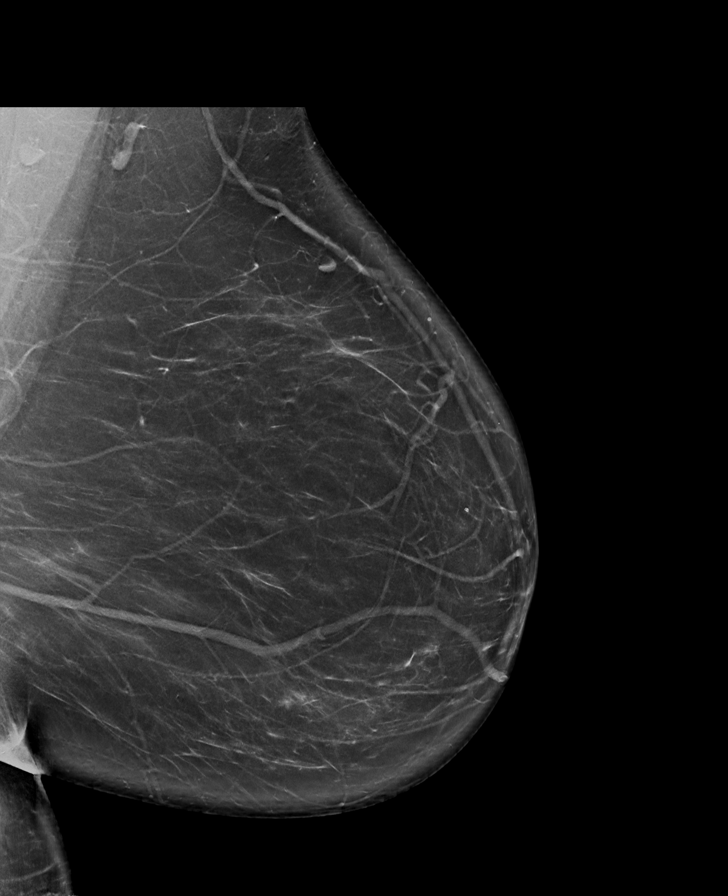

[R MLO synth-2D]
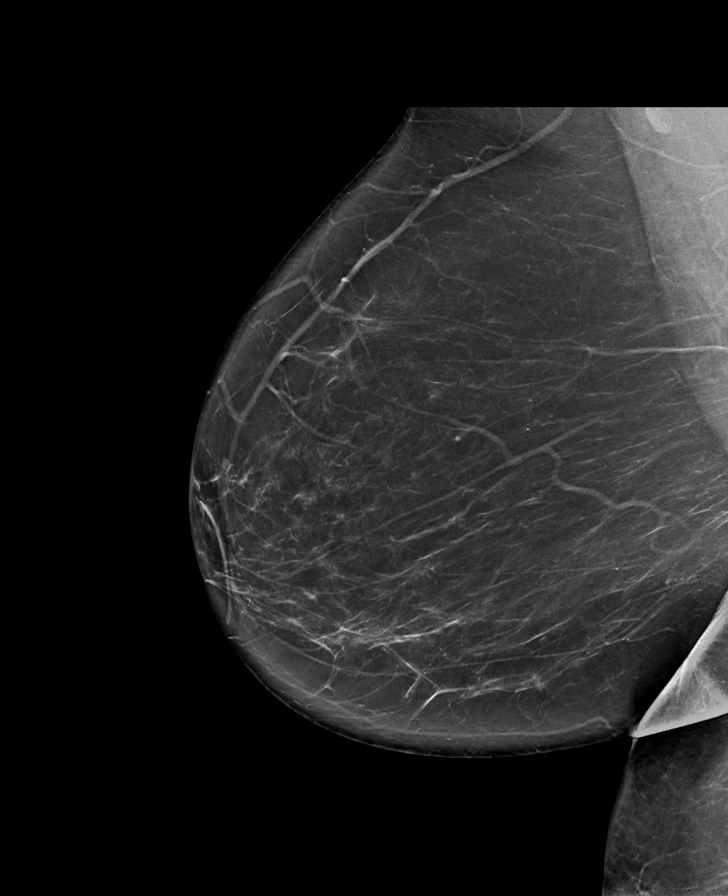

[L CC synth-2D]
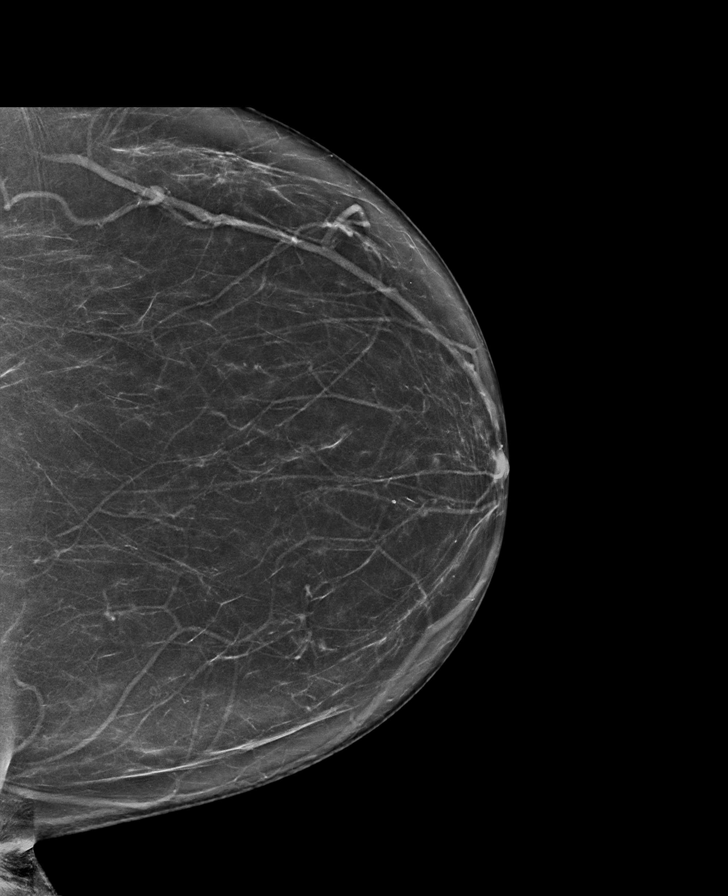

[R CC synth-2D]
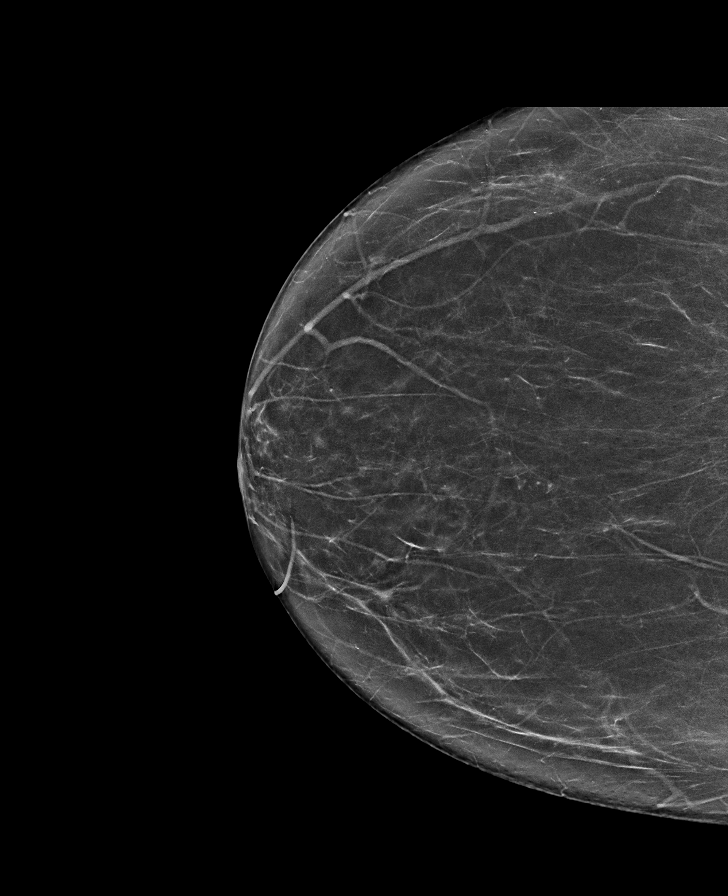

[R MLO tomo · tomo slice 45/90.0]
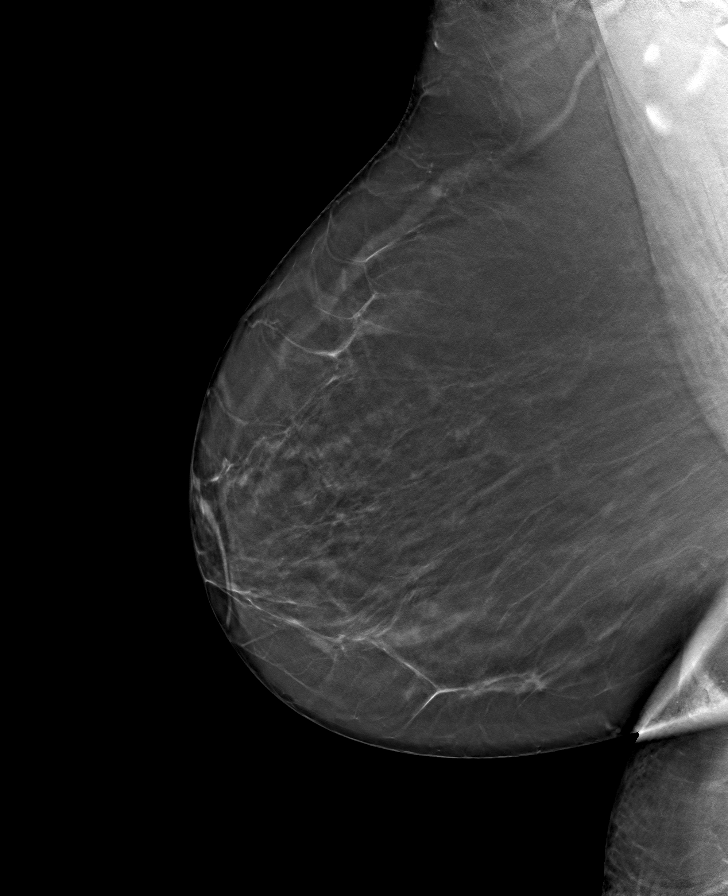

[R CC tomo · tomo slice 38/75.0]
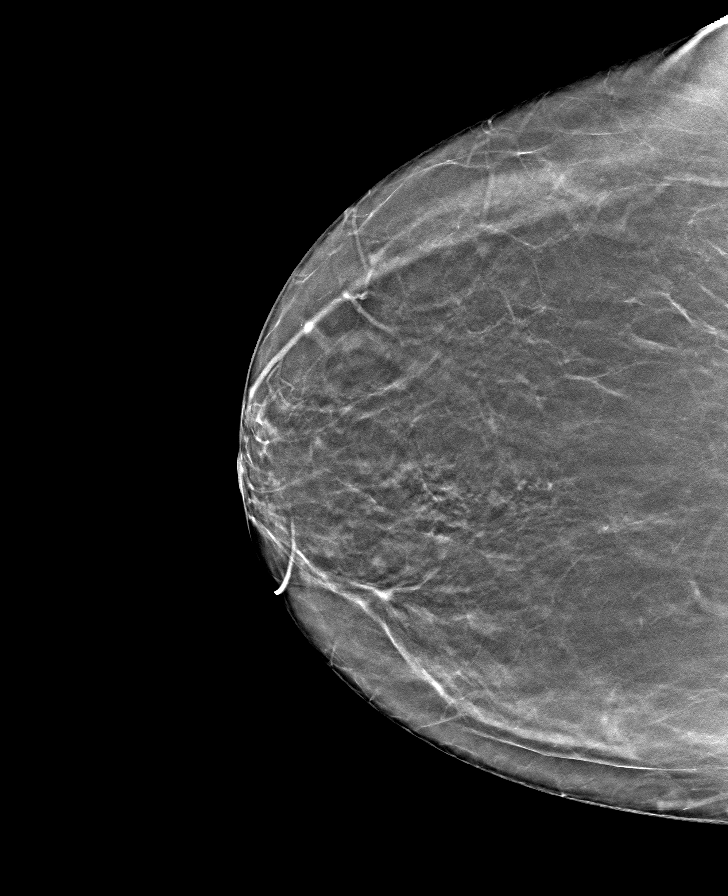

[L MLO tomo · tomo slice 46/91.0]
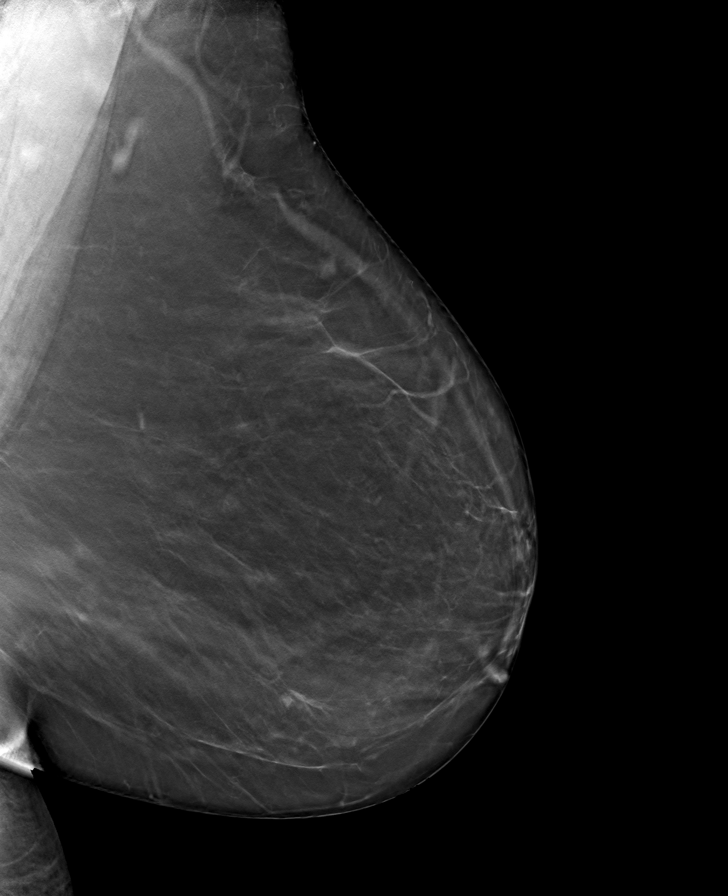

[L CC tomo · tomo slice 40/79.0]
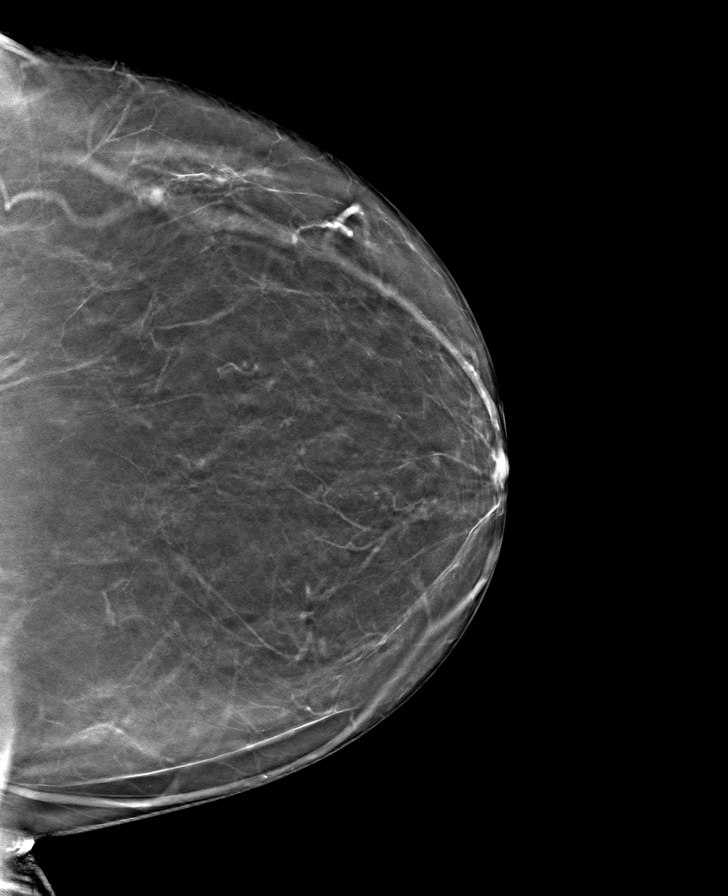

[8 of 24 positions shown; findings below may reference images not displayed]

FINDINGS: There are no findings suspicious for malignancy.
IMPRESSION: No mammographic evidence of malignancy. A result letter of this
screening mammogram will be mailed directly to the patient.

RECOMMENDATION:
Screening mammogram in one year. (Code:0E-3-N98)

BI-RADS CATEGORY  1: Negative.

## 2023-01-27 MED ORDER — AMLODIPINE BESYLATE 5 MG PO TABS
5.0000 mg | ORAL_TABLET | Freq: Every day | ORAL | 0 refills | Status: DC
Start: 2023-01-27 — End: 2023-03-01

## 2023-01-27 NOTE — Addendum Note (Signed)
Addended by: Tonny Bollman on: 01/27/2023 11:54 AM   Modules accepted: Orders

## 2023-01-27 NOTE — Telephone Encounter (Signed)
Prescription Request  01/27/2023  LOV: 09/07/22  What is the name of the medication or equipment? amLODipine (NORVASC) 5 MG tablet   Have you contacted your pharmacy to request a refill? Yes   Which pharmacy would you like this sent to?  [facility] #[id number] - [place], [place] - [address] AT [facility] [address] [place] [address] Phone: [phone] Fax: [phone]    Patient notified that their request is being sent to the clinical staff for review and that they should receive a response within 2 business days.   Please advise at Mobile [phone] (mobile)

## 2023-01-27 NOTE — Telephone Encounter (Signed)
Refill sent.

## 2023-01-31 ENCOUNTER — Encounter: Payer: Self-pay | Admitting: Family Medicine

## 2023-01-31 ENCOUNTER — Ambulatory Visit: Payer: Federal, State, Local not specified - PPO | Admitting: Family Medicine

## 2023-01-31 VITALS — BP 130/74 | HR 69 | Temp 97.9°F | Resp 18 | Ht 65.0 in | Wt 215.0 lb

## 2023-01-31 DIAGNOSIS — J069 Acute upper respiratory infection, unspecified: Secondary | ICD-10-CM

## 2023-01-31 DIAGNOSIS — E7800 Pure hypercholesterolemia, unspecified: Secondary | ICD-10-CM

## 2023-01-31 DIAGNOSIS — R52 Pain, unspecified: Secondary | ICD-10-CM | POA: Diagnosis not present

## 2023-01-31 DIAGNOSIS — M329 Systemic lupus erythematosus, unspecified: Secondary | ICD-10-CM

## 2023-01-31 DIAGNOSIS — E1122 Type 2 diabetes mellitus with diabetic chronic kidney disease: Secondary | ICD-10-CM

## 2023-01-31 DIAGNOSIS — R6883 Chills (without fever): Secondary | ICD-10-CM

## 2023-01-31 DIAGNOSIS — N1831 Chronic kidney disease, stage 3a: Secondary | ICD-10-CM

## 2023-01-31 LAB — POC COVID19/FLU A&B COMBO
Covid Antigen, POC: NEGATIVE
Influenza A Antigen, POC: NEGATIVE
Influenza B Antigen, POC: NEGATIVE

## 2023-01-31 NOTE — Patient Instructions (Signed)
You may take Tylenol 500 mg every 4 hours to reduce your pain and inflammation. Please call your lupus doctor to let them know what is going on.  It may be a lupus flare that requires adjustment of your current medicine.  Acetaminophen (Tylenol) - Immediate-release: 500 mg orally every 4  - Minimum Dosing Interval: every 4 hours - Maximum Dose: 4000 mg per 24 hours  -DO NOT TAKE IF DRINKING ALCOHOL

## 2023-01-31 NOTE — Progress Notes (Signed)
Acute Office Visit  Subjective:     Patient ID: Dawn Cox, female    DOB: 11-06-1945, 77 y.o.   MRN: [id number]  Chief Complaint  Patient presents with   "overall not feeling well"    HPI Patient is in today for pain, chills, feeling hot. Of note, she does have a history of systemic lupus erythematosus and was previously prescribed hydroxychloroquine 200 mg twice daily which is being managed by rheumatology.  For approximately 2 weeks, she has had widespread pain throughout her body, her back today is 10/10 pain.  She also endorses alternating hot flashes and chills for several months. Pain started when she first got home from about 4 months in [place], [place] taking care of her brother after he was diagnosed with cancer.  She is still grieving his loss.  Last week, she took a COVID test at home and the results were inconclusive.   Her lumbar back is the most painful and bothersome, but she does note some discomfort in knees bilaterally and her left tricep as well.  She endorses increased fatigue, rhinorrhea, sneezing, and feeling like her ears are clogged.  Her pain typically improves about 1 hour after taking her routine medication each day but then worsens approximately 2 hours later.  She has not identified any particular triggers, it is painful when she first lays down to go to bed but then gradually improves.  Denies GI upset.  Review of Systems  Constitutional:  Positive for chills and malaise/fatigue. Negative for fever.  HENT:  Positive for congestion. Negative for ear discharge, ear pain and sore throat.   Eyes:  Negative for blurred vision and double vision.  Respiratory:  Negative for cough and shortness of breath.   Cardiovascular:  Negative for chest pain and palpitations.  Gastrointestinal:  Negative for constipation, diarrhea, nausea and vomiting.  Musculoskeletal:  Positive for back pain.  Neurological:  Negative for dizziness and headaches.      Objective:    BP  130/74 (BP Location: Left Arm, Patient Position: Sitting, Cuff Size: Large)   Pulse 69   Temp 97.9 F (36.6 C) (Oral)   Resp 18   Ht 5\' 5"  (1.651 m)   Wt 215 lb (97.5 kg)   SpO2 100%   BMI 35.78 kg/m   Physical Exam Constitutional:      General: She is not in acute distress.    Appearance: Normal appearance. She is not ill-appearing.  HENT:     Head: Normocephalic and atraumatic.     Right Ear: External ear normal. There is impacted cerumen.     Left Ear: External ear normal. There is impacted cerumen.     Nose: Nose normal. No congestion or rhinorrhea.     Mouth/Throat:     Mouth: Mucous membranes are moist.     Pharynx: Oropharynx is clear. No oropharyngeal exudate or posterior oropharyngeal erythema.  Eyes:     General:        Right eye: No discharge.        Left eye: No discharge.     Conjunctiva/sclera: Conjunctivae normal.     Pupils: Pupils are equal, round, and reactive to light.  Cardiovascular:     Rate and Rhythm: Normal rate and regular rhythm.     Heart sounds: Murmur heard.     No friction rub. No gallop.  Pulmonary:     Effort: Pulmonary effort is normal. No respiratory distress.     Breath sounds: Normal breath sounds.  No wheezing, rhonchi or rales.  Musculoskeletal:     Right shoulder: No tenderness or bony tenderness.     Left shoulder: No tenderness or bony tenderness.     Right upper arm: No swelling or tenderness.     Left upper arm: Tenderness (left triceps TTP) present. No swelling or deformity.     Right elbow: No tenderness.     Left elbow: No tenderness.     Right forearm: No tenderness.     Left forearm: No tenderness.     Right wrist: No tenderness or bony tenderness.     Left wrist: No tenderness or bony tenderness.     Right hand: No tenderness or bony tenderness.     Left hand: No tenderness or bony tenderness.     Cervical back: No tenderness or bony tenderness.     Thoracic back: No tenderness or bony tenderness.     Lumbar back:  Tenderness and bony tenderness present. Negative right straight leg raise test and negative left straight leg raise test.     Right knee: No swelling or bony tenderness. No tenderness.     Left knee: No swelling, effusion or bony tenderness. No tenderness.     Right lower leg: No tenderness. No edema.     Left lower leg: No tenderness. No edema.  Skin:    General: Skin is warm and dry.  Neurological:     Mental Status: She is alert and oriented to person, place, and time.    Results for orders placed or performed in visit on 01/31/23  POC Covid19/Flu A&B Antigen  Result Value Ref Range   Influenza A Antigen, POC Negative Negative   Influenza B Antigen, POC Negative Negative   Covid Antigen, POC Negative Negative     Assessment & Plan:  Polyarticular arthralgias, acute lumbar back pain, rhinorrhea, sneezing, hot flashes, chills Given presence of URI symptoms, point-of-care COVID-19 and influenza tests performed, both negative.  Discussed the possibilities of viral arthritis, osteoarthritis, hypothyroidism, or lupus flare.  Recommend Tylenol as supportive care to cover for viral arthritis, checking thyroid labs given recent hot flashes and chills.  Afebrile, negative for headache and SLR.  No saddle anesthesia or incontinence, doubt cauda equina syndrome.  No recent diagnosis or treatment for cancer, up-to-date on cancer screenings.  Given lack of erythema or warmth of joints, presentation not consistent with infectious arthritis at this time.  No recent epidural or spinal, low suspicion for osteomyelitis or spinal abscess.  Most recent DEXA scan 10/28/2020 with T-score -0.6, within normal range, low suspicion for compression vertebral fracture.  Recommend follow-up with rheumatologist as soon as possible to discuss possibility of lupus flare.  Suspicious for lupus flare given improvement of symptoms after taking routine medication including hydroxychloroquine in the morning.  Patient verbalized  understanding, called rheumatologist and was able to schedule an appointment for the morning of 02/02/2023.  If symptoms have not improved with conservative therapy with primary care and possible adjustment of medication with rheumatology, may consider x-ray to evaluate for compression vertebral fracture and possible progression to MRI for evaluation.  Return in about 4 weeks (around 02/28/2023) for follow-up for HTN, HLD, DM.  Melida Quitter, PA

## 2023-02-01 ENCOUNTER — Other Ambulatory Visit: Payer: Federal, State, Local not specified - PPO

## 2023-02-01 DIAGNOSIS — R6883 Chills (without fever): Secondary | ICD-10-CM

## 2023-02-01 DIAGNOSIS — N1831 Chronic kidney disease, stage 3a: Secondary | ICD-10-CM

## 2023-02-01 DIAGNOSIS — R52 Pain, unspecified: Secondary | ICD-10-CM

## 2023-02-01 DIAGNOSIS — E7800 Pure hypercholesterolemia, unspecified: Secondary | ICD-10-CM

## 2023-02-02 LAB — CBC WITH DIFFERENTIAL/PLATELET
Basophils Absolute: 0.1 10*3/uL (ref 0.0–0.2)
Basos: 1 %
EOS (ABSOLUTE): 0.2 10*3/uL (ref 0.0–0.4)
Eos: 4 %
Hematocrit: 38.7 % (ref 34.0–46.6)
Hemoglobin: 12.3 g/dL (ref 11.1–15.9)
Immature Grans (Abs): 0 10*3/uL (ref 0.0–0.1)
Immature Granulocytes: 0 %
Lymphocytes Absolute: 2.8 10*3/uL (ref 0.7–3.1)
Lymphs: 55 %
MCH: 28.9 pg (ref 26.6–33.0)
MCHC: 31.8 g/dL (ref 31.5–35.7)
MCV: 91 fL (ref 79–97)
Monocytes Absolute: 0.4 10*3/uL (ref 0.1–0.9)
Monocytes: 8 %
Neutrophils Absolute: 1.6 10*3/uL (ref 1.4–7.0)
Neutrophils: 32 %
Platelets: 198 10*3/uL (ref 150–450)
RBC: 4.25 x10E6/uL (ref 3.77–5.28)
RDW: 13.5 % (ref 11.7–15.4)
WBC: 5.1 10*3/uL (ref 3.4–10.8)

## 2023-02-02 LAB — HEMOGLOBIN A1C
Est. average glucose Bld gHb Est-mCnc: 123 mg/dL
Hgb A1c MFr Bld: 5.9 % — ABNORMAL HIGH (ref 4.8–5.6)

## 2023-02-02 LAB — C-REACTIVE PROTEIN: CRP: <1 mg/L (ref 0–10)

## 2023-02-02 LAB — COMPREHENSIVE METABOLIC PANEL
ALT: 15 [IU]/L (ref 0–32)
AST: 26 [IU]/L (ref 0–40)
Albumin: 4.1 g/dL (ref 3.8–4.8)
Alkaline Phosphatase: 67 [IU]/L (ref 44–121)
BUN/Creatinine Ratio: 13 (ref 12–28)
BUN: 15 mg/dL (ref 8–27)
Bilirubin Total: 0.4 mg/dL (ref 0.0–1.2)
CO2: 23 mmol/L (ref 20–29)
Calcium: 9.3 mg/dL (ref 8.7–10.3)
Chloride: 104 mmol/L (ref 96–106)
Creatinine, Ser: 1.18 mg/dL — ABNORMAL HIGH (ref 0.57–1.00)
Globulin, Total: 2.5 g/dL (ref 1.5–4.5)
Glucose: 116 mg/dL — ABNORMAL HIGH (ref 70–99)
Potassium: 4.3 mmol/L (ref 3.5–5.2)
Sodium: 140 mmol/L (ref 134–144)
Total Protein: 6.6 g/dL (ref 6.0–8.5)
eGFR: 48 mL/min/{1.73_m2} — ABNORMAL LOW (ref >59)

## 2023-02-02 LAB — LIPID PANEL
Chol/HDL Ratio: 2.5 ratio (ref 0.0–4.4)
Cholesterol, Total: 132 mg/dL (ref 100–199)
HDL: 53 mg/dL (ref >39)
LDL Chol Calc (NIH): 58 mg/dL (ref 0–99)
Triglycerides: 117 mg/dL (ref 0–149)
VLDL Cholesterol Cal: 21 mg/dL (ref 5–40)

## 2023-02-02 LAB — SEDIMENTATION RATE: Sed Rate: 18 mm/h (ref 0–40)

## 2023-02-02 LAB — T4, FREE: Free T4: 0.87 ng/dL (ref 0.82–1.77)

## 2023-02-02 LAB — TSH: TSH: 2.98 u[IU]/mL (ref 0.450–4.500)

## 2023-02-04 ENCOUNTER — Other Ambulatory Visit: Payer: Self-pay | Admitting: Family Medicine

## 2023-02-04 DIAGNOSIS — E785 Hyperlipidemia, unspecified: Secondary | ICD-10-CM

## 2023-02-16 ENCOUNTER — Other Ambulatory Visit: Payer: Self-pay | Admitting: Family Medicine

## 2023-02-16 DIAGNOSIS — N1831 Chronic kidney disease, stage 3a: Secondary | ICD-10-CM

## 2023-02-16 DIAGNOSIS — N183 Chronic kidney disease, stage 3 unspecified: Secondary | ICD-10-CM

## 2023-03-01 ENCOUNTER — Encounter: Payer: Self-pay | Admitting: Family Medicine

## 2023-03-01 ENCOUNTER — Ambulatory Visit: Payer: Federal, State, Local not specified - PPO | Admitting: Family Medicine

## 2023-03-01 VITALS — BP 112/66 | HR 70 | Resp 18 | Ht 65.0 in | Wt 211.0 lb

## 2023-03-01 DIAGNOSIS — E1169 Type 2 diabetes mellitus with other specified complication: Secondary | ICD-10-CM

## 2023-03-01 DIAGNOSIS — I152 Hypertension secondary to endocrine disorders: Secondary | ICD-10-CM

## 2023-03-01 DIAGNOSIS — E1159 Type 2 diabetes mellitus with other circulatory complications: Secondary | ICD-10-CM

## 2023-03-01 DIAGNOSIS — G250 Essential tremor: Secondary | ICD-10-CM

## 2023-03-01 DIAGNOSIS — N1831 Chronic kidney disease, stage 3a: Secondary | ICD-10-CM

## 2023-03-01 DIAGNOSIS — E1122 Type 2 diabetes mellitus with diabetic chronic kidney disease: Secondary | ICD-10-CM

## 2023-03-01 DIAGNOSIS — F32A Depression, unspecified: Secondary | ICD-10-CM

## 2023-03-01 DIAGNOSIS — E785 Hyperlipidemia, unspecified: Secondary | ICD-10-CM

## 2023-03-01 MED ORDER — DAPAGLIFLOZIN PROPANEDIOL 5 MG PO TABS: 5.0000 mg | ORAL_TABLET | Freq: Every day | ORAL | 1 refills | Status: DC

## 2023-03-01 MED ORDER — AMLODIPINE BESYLATE 5 MG PO TABS: 5.0000 mg | ORAL_TABLET | Freq: Every day | ORAL | 1 refills | Status: DC

## 2023-03-01 MED ORDER — PROPRANOLOL HCL 20 MG PO TABS: ORAL_TABLET | ORAL | 1 refills | Status: DC

## 2023-03-01 MED ORDER — ROSUVASTATIN CALCIUM 5 MG PO TABS: ORAL_TABLET | ORAL | 1 refills | Status: DC

## 2023-03-01 MED ORDER — FLUOXETINE HCL 40 MG PO CAPS: ORAL_CAPSULE | ORAL | 1 refills | Status: DC

## 2023-03-01 NOTE — Assessment & Plan Note (Signed)
BP goal <130/80. Stable, at goal.  Continue amlodipine 5 mg daily.  Most recent electrolytes within normal limits, creatinine improved to 1.18.  Will continue to monitor.

## 2023-03-01 NOTE — Patient Instructions (Signed)
Keep up the fantastic work!

## 2023-03-01 NOTE — Assessment & Plan Note (Signed)
Last lipid panel: LDL 58, HDL 53, triglycerides 117.  Hepatic function within normal limits.  Continue rosuvastatin 5 mg daily.  Will continue to monitor.

## 2023-03-01 NOTE — Assessment & Plan Note (Addendum)
Most recent A1c 5.9, well-controlled.  Creatinine also improved to 1.18, most recent GFR 48.  Continue Farxiga 5 mg daily.  Will continue to monitor.

## 2023-03-01 NOTE — Progress Notes (Signed)
Established Patient Office Visit  Subjective   Patient ID: Dawn Cox, female    DOB: 08/22/1945  Age: 77 y.o. MRN: [id number]  Chief Complaint  Patient presents with   Diabetes   Hyperlipidemia   Hypertension    HPI Dawn Cox is a 77 y.o. female presenting today for follow up of hypertension, hyperlipidemia, diabetes.  She is feeling much better since her previous visit.  She was able to see her rheumatologist and got a steroid injection which improved symptoms significantly. Hypertension:  Pt denies chest pain, SOB, dizziness, edema, syncope, fatigue or heart palpitations. Taking amlodipine, reports excellent compliance with treatment. Denies side effects. Hyperlipidemia: tolerating rosuvastatin well with no myalgias or significant side effects.  The 10-year ASCVD risk score (Arnett DK, et al., 2019) is: 21.7% Diabetes: denies hypoglycemic events, wounds or sores that are not healing well, increased thirst or urination. Denies vision problems, eye exam up-to-date.  Taking Marcelline Deist as prescribed without any side effects.   Outpatient Medications Prior to Visit  Medication Sig   albuterol (VENTOLIN HFA) 108 (90 Base) MCG/ACT inhaler Inhale 1-2 puffs into the lungs every 6 (six) hours as needed for wheezing or shortness of breath.   aspirin 325 MG tablet Take 325 mg by mouth once.    glycopyrrolate (ROBINUL) 2 MG tablet TAKE 1 TABLET(2 MG) BY MOUTH TWICE DAILY   hydroxychloroquine (PLAQUENIL) 200 MG tablet Take 200 mg by mouth 2 (two) times daily.   Multiple Vitamins-Minerals (HAIR SKIN AND NAILS FORMULA PO) Take 1 tablet by mouth daily.   traZODone (DESYREL) 50 MG tablet TAKE 1/2 TO 1 TABLET(25 TO 50 MG) BY MOUTH AT BEDTIME AS NEEDED FOR SLEEP   Turmeric 500 MG CAPS Take 1,000 mg by mouth daily.   [DISCONTINUED] amLODipine (NORVASC) 5 MG tablet Take 1 tablet (5 mg total) by mouth daily.   [DISCONTINUED] dapagliflozin propanediol (FARXIGA) 5 MG TABS tablet TAKE 1 TABLET(5 MG) BY  MOUTH DAILY BEFORE BREAKFAST   [DISCONTINUED] FLUoxetine (PROZAC) 40 MG capsule TAKE 1 CAPSULE(40 MG) BY MOUTH DAILY   [DISCONTINUED] propranolol (INDERAL) 20 MG tablet Take 1 tablet (20 mg total) by mouth up to 2 (two) times daily.   [DISCONTINUED] rosuvastatin (CRESTOR) 5 MG tablet TAKE 1 TABLET(5 MG) BY MOUTH DAILY   No facility-administered medications prior to visit.    ROS Negative unless otherwise noted in HPI   Objective:     BP 112/66 (BP Location: Left Arm, Patient Position: Sitting, Cuff Size: Large)   Pulse 70   Resp 18   Ht 5\' 5"  (1.651 m)   Wt 211 lb (95.7 kg)   SpO2 98%   BMI 35.11 kg/m   Physical Exam Constitutional:      General: She is not in acute distress.    Appearance: Normal appearance.  HENT:     Head: Normocephalic and atraumatic.  Cardiovascular:     Rate and Rhythm: Normal rate and regular rhythm.     Heart sounds: No murmur heard.    No friction rub. No gallop.  Pulmonary:     Effort: Pulmonary effort is normal. No respiratory distress.     Breath sounds: No wheezing, rhonchi or rales.  Skin:    General: Skin is warm and dry.  Neurological:     Mental Status: She is alert and oriented to person, place, and time.     Assessment & Plan:  Hypertension associated with diabetes (HCC) Assessment & Plan: BP goal <130/80. Stable, at goal.  Continue amlodipine 5 mg daily.  Most recent electrolytes within normal limits, creatinine improved to 1.18.  Will continue to monitor.  Orders: -     amLODIPine Besylate; Take 1 tablet (5 mg total) by mouth daily.  Dispense: 90 tablet; Refill: 1  Hyperlipidemia associated with type 2 diabetes mellitus (HCC) Assessment & Plan: Last lipid panel: LDL 58, HDL 53, triglycerides 117.  Hepatic function within normal limits.  Continue rosuvastatin 5 mg daily.  Will continue to monitor.  Orders: -     Rosuvastatin Calcium; TAKE 1 TABLET(5 MG) BY MOUTH DAILY  Dispense: 90 tablet; Refill: 1  Type 2 diabetes  mellitus with stage 3a chronic kidney disease, without long-term current use of insulin (HCC) Assessment & Plan: Most recent A1c 5.9, well-controlled.  Creatinine also improved to 1.18, most recent GFR 48.  Continue Farxiga 5 mg daily.  Will continue to monitor.  Orders: -     Dapagliflozin Propanediol; Take 1 tablet (5 mg total) by mouth daily.  Dispense: 90 tablet; Refill: 1  Essential tremor -     Propranolol HCl; Take 1 tablet (20 mg total) by mouth up to 2 (two) times daily.  Dispense: 180 tablet; Refill: 1  Depression, unspecified depression type -     FLUoxetine HCl; TAKE 1 CAPSULE(40 MG) BY MOUTH DAILY  Dispense: 90 capsule; Refill: 1    Return in about 4 months (around 06/30/2023) for follow-up for HTN, HLD, DM, fasting blood work 1 week before.    Melida Quitter, PA

## 2023-04-28 ENCOUNTER — Encounter: Payer: Self-pay | Admitting: Family Medicine

## 2023-05-03 ENCOUNTER — Ambulatory Visit (INDEPENDENT_AMBULATORY_CARE_PROVIDER_SITE_OTHER): Payer: Self-pay | Admitting: Family Medicine

## 2023-05-03 ENCOUNTER — Encounter: Payer: Self-pay | Admitting: Family Medicine

## 2023-05-03 VITALS — BP 126/73 | HR 76 | Ht 65.0 in | Wt 204.2 lb

## 2023-05-03 DIAGNOSIS — M329 Systemic lupus erythematosus, unspecified: Secondary | ICD-10-CM

## 2023-05-03 DIAGNOSIS — R052 Subacute cough: Secondary | ICD-10-CM

## 2023-05-03 MED ORDER — PREDNISONE 5 MG PO TABS: 7.5000 mg | ORAL_TABLET | Freq: Every day | ORAL | 0 refills | Status: AC

## 2023-05-03 MED ORDER — BENZONATATE 200 MG PO CAPS: 200.0000 mg | ORAL_CAPSULE | Freq: Two times a day (BID) | ORAL | 0 refills | Status: DC | PRN

## 2023-05-03 NOTE — Patient Instructions (Signed)
Continue taking Mucinex to help break up any congestion. Take Tylenol 500 mg every 4-6 hours as needed for headache, body aches. Use the cough medicine I sent called benzonatate to break the cough reflex cycle. We will do 4 days of a low-dose steroid to treat the rash that I believe is associated with a lupus flare.  Please also get in touch with your rheumatologist if your symptoms have not improved by Monday.

## 2023-05-03 NOTE — Progress Notes (Signed)
Acute Office Visit  Subjective:     Patient ID: Dawn Cox, female    DOB: 1945-07-19, 78 y.o.   MRN: [id number]  Chief Complaint  Patient presents with   Cough   Fatigue    HPI Patient is in today for URI symptoms. Symptoms include congestion, cough, diarrhea, and fever. Onset of symptoms was 3 weeks ago, gradually improving since that time. She also c/o rash located on her upper lip and chin and weakness/fatigue  for the past 2 weeks .  She is drinking plenty of fluids. Evaluation to date: none. Treatment to date: cough suppressants and decongestants. She tried to go to urgent care on 04/08/2023 but was told that her insurance was an active, so she was never evaluated.  She wonders if this is an illness or a flare of her lupus.  Her fatigue, fever, diarrhea, weakness have completely resolved.  The only lingering symptoms are her cough and the rash.    ROS See HPI    Objective:    BP 126/73   Pulse 76   Ht 5\' 5"  (1.651 m)   Wt 204 lb 4 oz (92.6 kg)   SpO2 94%   BMI 33.99 kg/m   Physical Exam Constitutional:      General: She is not in acute distress.    Appearance: Normal appearance. She is not ill-appearing.  HENT:     Head: Normocephalic and atraumatic.     Right Ear: Tympanic membrane, ear canal and external ear normal.     Left Ear: Tympanic membrane, ear canal and external ear normal.     Nose: Rhinorrhea present. No congestion.     Comments: Mild erythema bilaterally along inferior border of nose and along chin    Mouth/Throat:     Mouth: Mucous membranes are moist.     Pharynx: Oropharynx is clear. No oropharyngeal exudate or posterior oropharyngeal erythema.  Eyes:     General:        Right eye: No discharge.        Left eye: No discharge.     Conjunctiva/sclera: Conjunctivae normal.     Pupils: Pupils are equal, round, and reactive to light.  Cardiovascular:     Rate and Rhythm: Normal rate and regular rhythm.     Heart sounds: No murmur heard.    No  friction rub. No gallop.  Pulmonary:     Effort: Pulmonary effort is normal. No respiratory distress.     Breath sounds: Normal breath sounds. No wheezing, rhonchi or rales.  Skin:    General: Skin is warm and dry.  Neurological:     Mental Status: She is alert and oriented to person, place, and time.       Assessment & Plan:  Subacute cough -     Benzonatate; Take 1 capsule (200 mg total) by mouth 2 (two) times daily as needed for cough.  Dispense: 20 capsule; Refill: 0  Hx of systemic lupus erythematosus (SLE) (HCC) -     predniSONE; Take 1.5 tablets (7.5 mg total) by mouth daily with breakfast for 4 days.  Dispense: 6 tablet; Refill: 0  Low suspicion for acute otitis media, acute bacterial rhinosinusitis, pneumonia based on current presentation.  Continue symptomatic management with increased hydration, Mucinex, Tylenol for any headache/bodyaches, and start benzonatate for cough.  Start low-dose prednisone 7.5 mg daily for 4 days for suspected lupus flare.  Return if symptoms worsen or fail to improve.  Melida Quitter, PA

## 2023-05-21 ENCOUNTER — Other Ambulatory Visit: Payer: Self-pay | Admitting: Family Medicine

## 2023-05-21 DIAGNOSIS — I152 Hypertension secondary to endocrine disorders: Secondary | ICD-10-CM

## 2023-05-23 NOTE — Telephone Encounter (Signed)
 Message sent to wrong office.  KP  Copied from CRM [id number]. Topic: Clinical - Medical Advice >> May 23, 2023  2:28 PM Dawn Cox wrote: Reason for CRM: Patient has a rash that is now spreading, patient is needing to know what she can use and what she can take and does she need to come in and see the doctor.

## 2023-05-24 NOTE — Telephone Encounter (Signed)
 Pt has appt tomorrow for this.  She stated that she finished the steroid that you had given to her and it hasn't helped.

## 2023-05-24 NOTE — Telephone Encounter (Signed)
 She will need to be evaluated for the rash in order to determine the most appropriate treatment

## 2023-05-25 ENCOUNTER — Ambulatory Visit: Payer: Self-pay | Admitting: Family Medicine

## 2023-05-25 NOTE — Telephone Encounter (Signed)
 If you want to call her and see if she can get in with her rheumatologist, that might be a better place for her to spend her time/money given that she does have a history of lupus.

## 2023-05-26 ENCOUNTER — Ambulatory Visit (INDEPENDENT_AMBULATORY_CARE_PROVIDER_SITE_OTHER): Payer: Self-pay | Admitting: Family Medicine

## 2023-05-26 ENCOUNTER — Encounter: Payer: Self-pay | Admitting: Family Medicine

## 2023-05-26 VITALS — BP 124/75 | HR 70 | Ht 65.0 in | Wt 207.8 lb

## 2023-05-26 DIAGNOSIS — R21 Rash and other nonspecific skin eruption: Secondary | ICD-10-CM

## 2023-05-26 MED ORDER — METRONIDAZOLE 0.75 % EX GEL: CUTANEOUS | 1 refills | Status: DC

## 2023-05-26 NOTE — Progress Notes (Signed)
   Acute Office Visit  Subjective:     Patient ID: Dawn Cox, female    DOB: 05-21-1945, 78 y.o.   MRN: [id number]  Chief Complaint  Patient presents with   Rash    HPI Patient is in today for rash.  Initially presented to primary care on 05/03/2023 complaining of rash located on upper lip and chin as well as weakness/fatigue at the same time as URI symptoms.  Treated for lupus flare given history of SLE with prednisone taper.  Prednisone did not improve the rash at all.  It continues to be red and itchy.  It is only painful when she scratches off the whiteheads.  She has been using Neosporin and facial cleansers at home without any improvement.  ROS See HPI    Objective:    BP 124/75   Pulse 70   Ht 5\' 5"  (1.651 m)   Wt 207 lb 12 oz (94.2 kg)   SpO2 95%   BMI 34.57 kg/m   Physical Exam Constitutional:      General: She is not in acute distress.    Appearance: Normal appearance.  HENT:     Head: Normocephalic and atraumatic.  Cardiovascular:     Rate and Rhythm: Normal rate and regular rhythm.     Heart sounds: No murmur heard.    No friction rub. No gallop.  Pulmonary:     Effort: Pulmonary effort is normal. No respiratory distress.     Breath sounds: No wheezing, rhonchi or rales.  Skin:    General: Skin is warm and dry.     Comments: Pustular slightly erythematous rash surrounding lips  Neurological:     Mental Status: She is alert and oriented to person, place, and time.       Assessment & Plan:  Rash -     metroNIDAZOLE; Apply and rub a thin film twice daily, morning and evening, to entire affected areas after washing.  Dispense: 45 g; Refill: 1 -     Ambulatory referral to Dermatology  Given failure of response to prednisone, doubt lupus flare but encourage patient to contact rheumatologist for their opinion.  Will treat as perioral dermatitis with metronidazole gel for 4-8 weeks.  Also referring to dermatology for further evaluation.  Patient verbalized  understanding and is agreeable to this plan.  Return if symptoms worsen or fail to improve.  Melida Quitter, PA

## 2023-05-26 NOTE — Patient Instructions (Signed)
-  Use the metronidazole gel twice a day on the affected area as instructed. You will need to use it for 4-8 weeks. -Stop using all other facial cleansers, lotions, moisturizers, topical steroids, etc. -I have sent a referral to dermatology.  -Call your rheumatologist to see what their thoughts are.

## 2023-05-31 ENCOUNTER — Encounter: Payer: Self-pay | Admitting: Family Medicine

## 2023-06-15 ENCOUNTER — Other Ambulatory Visit: Payer: Self-pay | Admitting: Family Medicine

## 2023-06-15 DIAGNOSIS — F32A Depression, unspecified: Secondary | ICD-10-CM

## 2023-06-20 ENCOUNTER — Other Ambulatory Visit: Payer: Self-pay | Admitting: *Deleted

## 2023-06-20 DIAGNOSIS — E1159 Type 2 diabetes mellitus with other circulatory complications: Secondary | ICD-10-CM

## 2023-06-20 DIAGNOSIS — N1831 Chronic kidney disease, stage 3a: Secondary | ICD-10-CM

## 2023-06-20 DIAGNOSIS — E1169 Type 2 diabetes mellitus with other specified complication: Secondary | ICD-10-CM

## 2023-06-22 ENCOUNTER — Other Ambulatory Visit: Payer: Self-pay | Admitting: *Deleted

## 2023-06-22 DIAGNOSIS — E1122 Type 2 diabetes mellitus with diabetic chronic kidney disease: Secondary | ICD-10-CM

## 2023-06-23 ENCOUNTER — Other Ambulatory Visit: Payer: Federal, State, Local not specified - PPO

## 2023-06-23 ENCOUNTER — Other Ambulatory Visit: Payer: Self-pay | Admitting: Family Medicine

## 2023-06-23 DIAGNOSIS — I152 Hypertension secondary to endocrine disorders: Secondary | ICD-10-CM

## 2023-06-23 DIAGNOSIS — E1169 Type 2 diabetes mellitus with other specified complication: Secondary | ICD-10-CM

## 2023-06-23 DIAGNOSIS — N1831 Chronic kidney disease, stage 3a: Secondary | ICD-10-CM

## 2023-06-23 DIAGNOSIS — E1122 Type 2 diabetes mellitus with diabetic chronic kidney disease: Secondary | ICD-10-CM

## 2023-06-24 LAB — LIPID PANEL
Chol/HDL Ratio: 2.9 ratio (ref 0.0–4.4)
Cholesterol, Total: 158 mg/dL (ref 100–199)
HDL: 54 mg/dL (ref >39)
LDL Chol Calc (NIH): 83 mg/dL (ref 0–99)
Triglycerides: 115 mg/dL (ref 0–149)
VLDL Cholesterol Cal: 21 mg/dL (ref 5–40)

## 2023-06-24 LAB — COMPREHENSIVE METABOLIC PANEL WITH GFR
ALT: 16 IU/L (ref 0–32)
AST: 26 IU/L (ref 0–40)
Albumin: 3.9 g/dL (ref 3.8–4.8)
Alkaline Phosphatase: 72 IU/L (ref 44–121)
BUN/Creatinine Ratio: 16 (ref 12–28)
BUN: 19 mg/dL (ref 8–27)
Bilirubin Total: 0.3 mg/dL (ref 0.0–1.2)
CO2: 24 mmol/L (ref 20–29)
Calcium: 9.4 mg/dL (ref 8.7–10.3)
Chloride: 104 mmol/L (ref 96–106)
Creatinine, Ser: 1.22 mg/dL — ABNORMAL HIGH (ref 0.57–1.00)
Globulin, Total: 2.7 g/dL (ref 1.5–4.5)
Glucose: 132 mg/dL — ABNORMAL HIGH (ref 70–99)
Potassium: 4.3 mmol/L (ref 3.5–5.2)
Sodium: 142 mmol/L (ref 134–144)
Total Protein: 6.6 g/dL (ref 6.0–8.5)
eGFR: 46 mL/min/{1.73_m2} — ABNORMAL LOW (ref >59)

## 2023-06-24 LAB — MICROALBUMIN / CREATININE URINE RATIO
Creatinine, Urine: 74 mg/dL
Microalb/Creat Ratio: <4 mg/g{creat} (ref 0–29)
Microalbumin, Urine: <3 ug/mL

## 2023-06-24 LAB — HEMOGLOBIN A1C
Est. average glucose Bld gHb Est-mCnc: 126 mg/dL
Hgb A1c MFr Bld: 6 % — ABNORMAL HIGH (ref 4.8–5.6)

## 2023-06-30 ENCOUNTER — Ambulatory Visit (INDEPENDENT_AMBULATORY_CARE_PROVIDER_SITE_OTHER): Payer: Self-pay | Admitting: Family Medicine

## 2023-06-30 ENCOUNTER — Ambulatory Visit: Payer: Federal, State, Local not specified - PPO | Admitting: Family Medicine

## 2023-06-30 ENCOUNTER — Encounter: Payer: Self-pay | Admitting: Family Medicine

## 2023-06-30 VITALS — BP 120/72 | HR 73 | Ht 65.0 in | Wt 210.0 lb

## 2023-06-30 DIAGNOSIS — L710 Perioral dermatitis: Secondary | ICD-10-CM

## 2023-06-30 DIAGNOSIS — Z7984 Long term (current) use of oral hypoglycemic drugs: Secondary | ICD-10-CM

## 2023-06-30 DIAGNOSIS — N1831 Chronic kidney disease, stage 3a: Secondary | ICD-10-CM

## 2023-06-30 DIAGNOSIS — E785 Hyperlipidemia, unspecified: Secondary | ICD-10-CM

## 2023-06-30 DIAGNOSIS — E1122 Type 2 diabetes mellitus with diabetic chronic kidney disease: Secondary | ICD-10-CM

## 2023-06-30 DIAGNOSIS — I152 Hypertension secondary to endocrine disorders: Secondary | ICD-10-CM

## 2023-06-30 DIAGNOSIS — E1169 Type 2 diabetes mellitus with other specified complication: Secondary | ICD-10-CM

## 2023-06-30 DIAGNOSIS — M329 Systemic lupus erythematosus, unspecified: Secondary | ICD-10-CM

## 2023-06-30 DIAGNOSIS — E1159 Type 2 diabetes mellitus with other circulatory complications: Secondary | ICD-10-CM

## 2023-06-30 MED ORDER — METRONIDAZOLE 0.75 % EX GEL: CUTANEOUS | 1 refills | Status: AC

## 2023-06-30 MED ORDER — ALBUTEROL SULFATE HFA 108 (90 BASE) MCG/ACT IN AERS
1.0000 | INHALATION_SPRAY | Freq: Four times a day (QID) | RESPIRATORY_TRACT | 11 refills | Status: AC | PRN
Start: 2023-06-30 — End: ?

## 2023-06-30 MED ORDER — DAPAGLIFLOZIN PROPANEDIOL 5 MG PO TABS: 5.0000 mg | ORAL_TABLET | Freq: Every day | ORAL | 1 refills | Status: DC

## 2023-06-30 NOTE — Assessment & Plan Note (Signed)
 Continue amlodipine.  Blood pressure at goal.

## 2023-06-30 NOTE — Assessment & Plan Note (Signed)
 Follows with [facility] nephrology at [facility].  Kidney function stable.

## 2023-06-30 NOTE — Assessment & Plan Note (Signed)
 Continue Plaquenil.  Management per rheumatology

## 2023-06-30 NOTE — Assessment & Plan Note (Signed)
 Metronidazole gel prescribed again.  Did not pick up the last time.

## 2023-06-30 NOTE — Progress Notes (Signed)
   Established Patient Office Visit  Subjective   Patient ID: Dawn Cox, female    DOB: 26-Dec-1945  Age: 78 y.o. MRN: [id number]  Chief Complaint  Patient presents with   Medical Management of Chronic Issues    HPI  DM 2-patient takes [place] only.  Her A1c is 6.0.  Has no concerns with this medication.  She does have neuropathy in her feet but does not want to take any new medications for it at this time.  Patient also sees the ophthalmologist at [facility] eye care, Dr. Massie Kluver  CKD-we discussed her creatinine and GFR labs.  She sees a nephrologist at [facility].    Essential tremors-patient takes propranolol for essential tremors.  Takes it as needed, not every day.  She feels as if the medication works when she takes it.  SLE-patient recently had a refill of Plaquenil that was only 10 tablets.   She states she has appointment with the rheumatologist later this month.  Insomnia-patient takes trazodone as needed for sleep.  Does not take it very often.  Rash-patient has small papular rash on the perioral region that has been there for several months.  She never picked up the metronidazole gel to use it.  Requesting a new prescription for this.   The 10-year ASCVD risk score (Arnett DK, et al., 2019) is: 27.4%  Health Maintenance Due  Topic Date Due   COVID-19 Vaccine (8 - 2024-25 season) 11/21/2022   OPHTHALMOLOGY EXAM  05/04/2023      Objective:     BP 120/72   Pulse 73   Ht 5\' 5"  (1.651 m)   Wt 210 lb (95.3 kg)   SpO2 100%   BMI 34.95 kg/m    Physical Exam General: Alert, oriented CV: Regular rhythm Pulmonary: Lungs clear bilaterally Extremities: No pedal edema.  Sensation intact to monofilament testing bilaterally.  DP pulses intact bilaterally.  No ulcerations or other deformities.   No results found for any visits on 06/30/23.      Assessment & Plan:   Stage 3a chronic kidney disease [facility]) Assessment & Plan: Follows with [facility]  nephrology at [facility].  Kidney function stable.   Type 2 diabetes mellitus with stage 3a chronic kidney disease, without long-term current use of insulin (HCC) Assessment & Plan: A1c at goal.  Continue Farxiga.  Foot exam normal today.  UACR normal.  Orders: -     Dapagliflozin Propanediol; Take 1 tablet (5 mg total) by mouth daily.  Dispense: 90 tablet; Refill: 1  Perioral dermatitis Assessment & Plan: Metronidazole gel prescribed again.  Did not pick up the last time.  Orders: -     metroNIDAZOLE; Apply and rub a thin film twice daily, morning and evening, to entire affected areas after washing.  Dispense: 45 g; Refill: 1  Hx of systemic lupus erythematosus (SLE) (HCC) Assessment & Plan: Continue Plaquenil.  Management per rheumatology   Hyperlipidemia associated with type 2 diabetes mellitus (HCC) Assessment & Plan: Continue rosuvastatin.  LDL at goal.   Hypertension associated with diabetes [facility]) Assessment & Plan: Continue amlodipine.  Blood pressure at goal.   Other orders -     Albuterol Sulfate HFA; Inhale 1-2 puffs into the lungs every 6 (six) hours as needed for wheezing or shortness of breath.  Dispense: 1 each; Refill: 11     Return in about 6 months (around 12/30/2023) for DM, hld, HTN.    Sandre Kitty, MD

## 2023-06-30 NOTE — Assessment & Plan Note (Signed)
Continue rosuvastatin; LDL at goal.

## 2023-06-30 NOTE — Assessment & Plan Note (Signed)
 A1c at goal.  Continue Farxiga.  Foot exam normal today.  UACR normal.

## 2023-06-30 NOTE — Patient Instructions (Signed)
 It was nice to see you today,  We addressed the following topics today: -I would see you back in 6 months for follow-up of your cholesterol and blood pressure and diabetes. - If you need to see [place] sooner for anything make an appointment or let us know. - If your rash is not improving after 2 weeks of using the metronidazole gel then please let us know - Please talk to your rheumatologist about Plaquenil refills if necessary.  Have a great day,  Frederic Jericho, MD

## 2023-07-25 ENCOUNTER — Other Ambulatory Visit: Payer: Self-pay

## 2023-07-25 DIAGNOSIS — E1169 Type 2 diabetes mellitus with other specified complication: Secondary | ICD-10-CM

## 2023-07-25 DIAGNOSIS — I152 Hypertension secondary to endocrine disorders: Secondary | ICD-10-CM

## 2023-07-25 MED ORDER — AMLODIPINE BESYLATE 5 MG PO TABS
5.0000 mg | ORAL_TABLET | Freq: Every day | ORAL | 1 refills | Status: DC
Start: 2023-07-25 — End: 2023-11-30

## 2023-07-25 MED ORDER — ROSUVASTATIN CALCIUM 5 MG PO TABS: ORAL_TABLET | ORAL | 1 refills | Status: DC

## 2023-08-03 ENCOUNTER — Ambulatory Visit: Payer: Self-pay | Admitting: Dermatology

## 2023-08-03 ENCOUNTER — Encounter: Payer: Self-pay | Admitting: Dermatology

## 2023-08-03 VITALS — BP 101/60 | HR 61

## 2023-08-03 DIAGNOSIS — L710 Perioral dermatitis: Secondary | ICD-10-CM

## 2023-08-03 NOTE — Progress Notes (Signed)
   New Patient Visit   Subjective  Dawn Cox is a 78 y.o. female who presents for the following: Rash  Patient states she has rash located at the face around mouth that she would like to have examined. Patient reports the areas have been there for 2 months. She reports the areas are bothersome. She reports the area was very itchy and red. The redness has now subsided. Patient rates irritation 5 out of 10 coming and going.  Patient reports she has previously been treated for these areas by pcp. She was prescribed Metro Gel 0.75 gel to apply 2 times daily.  She felt that helped relieve the redness but the area is still itchy. She states her PCP stated it could be related to her lupus. She is currently taking her Plaquenil daily with no issues. She also reports she washes her face daily with Ivory Soap.  The following portions of the chart were reviewed this encounter and updated as appropriate: medications, allergies, medical history  Review of Systems:  No other skin or systemic complaints except as noted in HPI or Assessment and Plan.  Objective  Well appearing patient in no apparent distress; mood and affect are within normal limits.  A focused examination was performed of the following areas: Peri-oral  Relevant exam findings are noted in the Assessment and Plan.      Assessment & Plan   1. Periorificial granulomatous dermatitis  - Assessment: Pink clusters and papules around the mouth and a few on the lower rim of the nose, consistent with periorificial dermatitis. Symptoms developed within the first four days of a flu-like illness (possibly COVID) one month ago. Patient reports occasional itching.   - Plan:    Initiate topical treatment regimen:     - Morning: Apply metronidazole  and clindamycin     - Evening: Apply metronidazole , clindamycin, and adapalene    Provide samples of prescribed topical medications    Recommend use of moisturizer if skin becomes dry     - If  excessive dryness occurs, adjust adapalene to every other night    Provide samples of La Roche-Posay Gentle Cleanser for facial use    Advise against using Ivory soap on face (suitable for body only)    Take close-up photos of each side of face to assess improvement    Follow-up in 6 weeks     - If no improvement, consider adding oral erythromycin      Return in about 6 weeks (around 09/14/2023) for Perioral Dermatitist F/U.  I, Jetta Ager, am acting as Neurosurgeon for [employer], DO.  Documentation: I have reviewed the above documentation for accuracy and completeness, and I agree with the above.  [name], DO

## 2023-08-03 NOTE — Patient Instructions (Addendum)
 Date: Wed Aug 03 2023  Hello Koneta,  Thank you for visiting today. Here is a summary of the key instructions:  - Morning Medications:   - Apply metronidazole  and erythromycin  topical creams   - If skin becomes very dry, apply moisturizer before and after creams  - Evening Medications:   - Apply metronidazole , erythromycin , and adapalene (sample provided) topical creams   - If skin becomes very dry from adapalene, use it every other night instead of nightly  - Skin Care:   - Use provided samples of gentle face wash   - Do not use Ivory soap on face  - Follow-up: Return for follow-up appointment in 6 weeks  We look forward to seeing you at your next visit. If you have any questions or concerns before then, please do not hesitate to contact our office.  Warm regards,  Dr. [name], Dermatology     Important Information   Due to recent changes in healthcare laws, you may see results of your pathology and/or laboratory studies on MyChart before the doctors have had a chance to review them. We understand that in some cases there may be results that are confusing or concerning to you. Please understand that not all results are received at the same time and often the doctors may need to interpret multiple results in order to provide you with the best plan of care or course of treatment. Therefore, we ask that you please give us  2 business days to thoroughly review all your results before contacting the office for clarification. Should we see a critical lab result, you will be contacted sooner.     If You Need Anything After Your Visit   If you have any questions or concerns for your doctor, please call our main line at [phone]. If no one answers, please leave a voicemail as directed and we will return your call as soon as possible. Messages left after 4 pm will be answered the following business day.    You may also send us  a message via MyChart. We typically respond to  MyChart messages within 1-2 business days.  For prescription refills, please ask your pharmacy to contact our office. Our fax number is [phone].  If you have an urgent issue when the clinic is closed that cannot wait until the next business day, you can page your doctor at the number below.     Please note that while we do our best to be available for urgent issues outside of office hours, we are not available 24/7.    If you have an urgent issue and are unable to reach us , you may choose to seek medical care at your doctor's office, retail clinic, urgent care center, or emergency room.   If you have a medical emergency, please immediately call 911 or go to the emergency department. In the event of inclement weather, please call our main line at [phone] for an update on the status of any delays or closures.  Dermatology Medication Tips: Please keep the boxes that topical medications come in in order to help keep track of the instructions about where and how to use these. Pharmacies typically print the medication instructions only on the boxes and not directly on the medication tubes.   If your medication is too expensive, please contact our office at [phone] or send us  a message through MyChart.    We are unable to tell what your co-pay for medications will be in advance as this is  different depending on your insurance coverage. However, we may be able to find a substitute medication at lower cost or fill out paperwork to get insurance to cover a needed medication.    If a prior authorization is required to get your medication covered by your insurance company, please allow us  1-2 business days to complete this process.   Drug prices often vary depending on where the prescription is filled and some pharmacies may offer cheaper prices.   The website www.goodrx.com contains coupons for medications through different pharmacies. The prices here do not account for what the cost may  be with help from insurance (it may be cheaper with your insurance), but the website can give you the price if you did not use any insurance.  - You can print the associated coupon and take it with your prescription to the pharmacy.  - You may also stop by our office during regular business hours and pick up a GoodRx coupon card.  - If you need your prescription sent electronically to a different pharmacy, notify our office through [facility] or by phone at [phone]

## 2023-08-22 MED ORDER — CLINDAMYCIN PHOSPHATE 1 % EX LOTN
TOPICAL_LOTION | Freq: Every day | CUTANEOUS | 0 refills | Status: AC
Start: 2023-08-22 — End: 2024-08-21

## 2023-09-14 ENCOUNTER — Ambulatory Visit: Admitting: Dermatology

## 2023-09-27 ENCOUNTER — Other Ambulatory Visit: Payer: Self-pay | Admitting: Family Medicine

## 2023-09-27 DIAGNOSIS — E1169 Type 2 diabetes mellitus with other specified complication: Secondary | ICD-10-CM

## 2023-11-26 ENCOUNTER — Other Ambulatory Visit: Payer: Self-pay | Admitting: Family Medicine

## 2023-11-26 ENCOUNTER — Other Ambulatory Visit: Payer: Self-pay | Admitting: Gastroenterology

## 2023-11-26 DIAGNOSIS — E1159 Type 2 diabetes mellitus with other circulatory complications: Secondary | ICD-10-CM

## 2023-12-05 ENCOUNTER — Telehealth: Payer: Self-pay | Admitting: *Deleted

## 2023-12-05 NOTE — Telephone Encounter (Signed)
 Confirmed with pharmacy that the pt would NOT need a Rx for a covid vaccine and they said that was correct and I called pt and informed her of this

## 2023-12-05 NOTE — Telephone Encounter (Signed)
 Copied from CRM #[id number]. Topic: Clinical - Medication Question >> Dec 02, 2023 12:31 PM Debby BROCKS wrote: Reason for CRM: Patient would like the covid prescription so that she may take the vaccine at her pharmacy

## 2023-12-20 ENCOUNTER — Other Ambulatory Visit: Payer: Self-pay | Admitting: Family Medicine

## 2023-12-20 DIAGNOSIS — F32A Depression, unspecified: Secondary | ICD-10-CM

## 2023-12-30 ENCOUNTER — Ambulatory Visit: Payer: Self-pay | Admitting: Family Medicine

## 2023-12-30 ENCOUNTER — Ambulatory Visit: Payer: Self-pay

## 2023-12-30 VITALS — BP 126/72 | HR 58 | Temp 97.5°F | Ht 65.0 in | Wt 216.1 lb

## 2023-12-30 DIAGNOSIS — F32A Depression, unspecified: Secondary | ICD-10-CM

## 2023-12-30 DIAGNOSIS — E1169 Type 2 diabetes mellitus with other specified complication: Secondary | ICD-10-CM | POA: Diagnosis not present

## 2023-12-30 DIAGNOSIS — R42 Dizziness and giddiness: Secondary | ICD-10-CM

## 2023-12-30 DIAGNOSIS — M329 Systemic lupus erythematosus, unspecified: Secondary | ICD-10-CM | POA: Diagnosis not present

## 2023-12-30 DIAGNOSIS — E1122 Type 2 diabetes mellitus with diabetic chronic kidney disease: Secondary | ICD-10-CM | POA: Diagnosis not present

## 2023-12-30 DIAGNOSIS — Z7984 Long term (current) use of oral hypoglycemic drugs: Secondary | ICD-10-CM

## 2023-12-30 DIAGNOSIS — N1831 Chronic kidney disease, stage 3a: Secondary | ICD-10-CM

## 2023-12-30 DIAGNOSIS — E785 Hyperlipidemia, unspecified: Secondary | ICD-10-CM

## 2023-12-30 DIAGNOSIS — G250 Essential tremor: Secondary | ICD-10-CM

## 2023-12-30 MED ORDER — FREESTYLE LIBRE 3 PLUS SENSOR MISC: 3 refills | Status: AC

## 2023-12-30 MED ORDER — PROPRANOLOL HCL 20 MG PO TABS: ORAL_TABLET | ORAL | 1 refills | Status: AC

## 2023-12-30 NOTE — Assessment & Plan Note (Signed)
 Intermittent dizziness possibly linked to hypoglycemia. No home glucose monitoring. - Order [facility] for continuous glucose monitoring with prior authorization.

## 2023-12-30 NOTE — Assessment & Plan Note (Signed)
 Follows with [facility] nephrology at [facility].  Kidney function stable. Rechecking CMP today

## 2023-12-30 NOTE — Assessment & Plan Note (Signed)
 A1c at goal at 6.3 Possible hypoglycemic episodes suggested by dizziness alleviated by eating. No recent eye exam for diabetic retinopathy. - Order Freestyle Libre for continuous glucose monitoring with prior authorization. - Advise to schedule an eye exam before the end of the year. - Foot exam at [facility]

## 2023-12-30 NOTE — Patient Instructions (Signed)
 VISIT SUMMARY: Today, we reviewed your medications and checked your blood pressure. We discussed your dizziness, diabetes management, blood pressure, tremor, sleep issues, and lupus care.  YOUR PLAN: TYPE 2 DIABETES MELLITUS: Your diabetes is well-controlled with an A1c of 6.3, but you may be experiencing low blood sugar, which could be causing your dizziness. -We will order a Freestyle Libre for continuous glucose monitoring, pending prior authorization. -Please schedule an eye exam before the end of the year to check for diabetic retinopathy.  DIZZINESS: Your dizziness may be related to low blood sugar levels. -We will order a Freestyle Libre for continuous glucose monitoring, pending prior authorization.  ESSENTIAL HYPERTENSION: Your blood pressure is well-controlled with your current medications. -We will refill your propranolol  prescription. -Continue taking your current blood pressure medications as prescribed.  TREMOR: Your tremor is partially managed with propranolol . -We will refill your propranolol  prescription.  SYSTEMIC LUPUS ERYTHEMATOSUS: Your lupus is being managed with hydroxychloroquine. -Continue taking hydroxychloroquine as prescribed.  HYPERLIPIDEMIA: Your cholesterol levels need to be rechecked. -We will order blood work to recheck your cholesterol levels.  DEPRESSION: Your depression is being managed with Prozac . -Continue taking Prozac  40 mg as prescribed.  GENERAL HEALTH MAINTENANCE: You are up to date with your flu and COVID vaccinations, but you need an eye exam. -Please schedule an eye exam before the end of the year.  If you have any problems before your next visit feel free to message me via MyChart (minor issues or questions) or call the office, otherwise you may reach out to schedule an office visit.  Thank you! Saddie Sacks, PA-C

## 2023-12-30 NOTE — Progress Notes (Signed)
 Established Patient Office Visit  Subjective   Patient ID: Dawn Cox, female    DOB: [birth year]  Age: 78 y.o. MRN: [id number]  Chief Complaint  Patient presents with   Medical Management of Chronic Issues    HPI   History of Present Illness   Dawn Cox is a 78 year old female with hypertension, diabetes, and lupus who presents for medication review and blood pressure check.  Dizziness and falls - Dizziness present, intermittent - Does not check blood sugar or blood pressure when episodes occur -Dizziness improved when she eats something  Glycemic monitoring and diabetes management - Diabetes mellitus present - Does not routinely check blood glucose at home - Lacks supplies for home blood glucose monitoring - Currently taking Farxiga  for diabetes management  Blood pressure monitoring and hypertension - Hypertension present - Home blood pressure readings typically around 125/70 mmHg - Currently taking amlodipine  5 mg daily for blood pressure control  Tremor - Tremor present - Propranolol  20 mg PRN provides partial relief of tremor symptoms  Lupus management - Systemic lupus erythematosus present - Under care of rheumatologist at [facility] - Currently taking hydroxychloroquine, recently refilled   Preventive care and health maintenance - No eye exam performed this year; plans to schedule before year end - Received influenza and COVID vaccinations last month          ROS Per HPI.    Objective:     BP 126/72   Pulse (!) 58   Temp (!) 97.5 F (36.4 C) (Oral)   Ht 5' 5 (1.651 m)   Wt 216 lb 1.3 oz (98 kg)   SpO2 100%   BMI 35.96 kg/m    Physical Exam Constitutional:      General: She is not in acute distress.    Appearance: Normal appearance.  Cardiovascular:     Rate and Rhythm: Normal rate and regular rhythm.     Heart sounds: Normal heart sounds. No murmur heard.    No friction rub. No gallop.  Pulmonary:     Effort:  Pulmonary effort is normal. No respiratory distress.     Breath sounds: Normal breath sounds.  Musculoskeletal:        General: No swelling.  Skin:    General: Skin is warm and dry.  Neurological:     General: No focal deficit present.     Mental Status: She is alert.  Psychiatric:        Mood and Affect: Mood normal.        Behavior: Behavior normal.        Thought Content: Thought content normal.     No results found for any visits on 12/30/23.  Last CBC Lab Results  Component Value Date   WBC 5.1 02/01/2023   HGB 12.3 02/01/2023   HCT 38.7 02/01/2023   MCV 91 02/01/2023   MCH 28.9 02/01/2023   RDW 13.5 02/01/2023   PLT 198 02/01/2023   Last metabolic panel Lab Results  Component Value Date   GLUCOSE 132 (H) 06/23/2023   NA 142 06/23/2023   K 4.3 06/23/2023   CL 104 06/23/2023   CO2 24 06/23/2023   BUN 19 06/23/2023   CREATININE 1.22 (H) 06/23/2023   EGFR 46 (L) 06/23/2023   CALCIUM  9.4 06/23/2023   PHOS 3.9 02/25/2021   PROT 6.6 06/23/2023   ALBUMIN 3.9 06/23/2023   LABGLOB 2.7 06/23/2023   AGRATIO 1.4 06/07/2022   BILITOT 0.3 06/23/2023   ALKPHOS  72 06/23/2023   AST 26 06/23/2023   ALT 16 06/23/2023   ANIONGAP 9 05/04/2017   Last lipids Lab Results  Component Value Date   CHOL 158 06/23/2023   HDL 54 06/23/2023   LDLCALC 83 06/23/2023   TRIG 115 06/23/2023   CHOLHDL 2.9 06/23/2023   Last hemoglobin A1c Lab Results  Component Value Date   HGBA1C 6.0 (H) 06/23/2023   Last thyroid functions Lab Results  Component Value Date   TSH 2.980 02/01/2023      The 10-year ASCVD risk score (Arnett DK, et al., 2019) is: 29%    Assessment & Plan:   Type 2 diabetes mellitus with stage 3a chronic kidney disease, without long-term current use of insulin (HCC) Assessment & Plan: A1c at goal at 6.3 Possible hypoglycemic episodes suggested by dizziness alleviated by eating. No recent eye exam for diabetic retinopathy. - Order Freestyle Libre for  continuous glucose monitoring with prior authorization. - Advise to schedule an eye exam before the end of the year. - Foot exam at [facility]  Orders: -     FreeStyle Libre 3 Plus Sensor; Change sensor every 15 days.  Dispense: 1 each; Refill: 3 -     Comprehensive metabolic panel with GFR; Future -     Lipid panel; Future -     CBC with Differential/Platelet; Future  Essential tremor Assessment & Plan: Patient continuing with trial of propranolol  20 mg up to twice daily which is effective for her  Orders: -     Propranolol  HCl; Take 1 tablet (20 mg total) by mouth up to 2 (two) times daily.  Dispense: 180 tablet; Refill: 1  Dizziness Assessment & Plan: Intermittent dizziness possibly linked to hypoglycemia. No home glucose monitoring. - Order [facility] for continuous glucose monitoring with prior authorization.    Stage 3a chronic kidney disease [facility]) Assessment & Plan: Follows with [facility] nephrology at [facility].  Kidney function stable. Rechecking CMP today   Hx of systemic lupus erythematosus (SLE) (HCC) Assessment & Plan: Continue Plaquenil.  Management per rheumatology   Hyperlipidemia associated with type 2 diabetes mellitus (HCC) Assessment & Plan: Last LDL at goal. Continue rosuvastatin  5 mg daily. Rechecking lipid panel and CMP today. The 10-year ASCVD risk score (Arnett DK, et al., 2019) is: 29%    Depression, unspecified depression type Assessment & Plan: Managed with Prozac  40 mg daily and patient is doing well.    Obesity, morbid (HCC) Assessment & Plan: Chronic issues managed well including HTN, HLD, DM.  Continue with low fat diet and exercise as tolerated.    Return in about 6 months (around 06/29/2024) for HTN, HLD, DM.    Dawn JULIANNA Sacks, PA-C

## 2023-12-30 NOTE — Assessment & Plan Note (Signed)
 Last LDL at goal. Continue rosuvastatin  5 mg daily. Rechecking lipid panel and CMP today. The 10-year ASCVD risk score (Arnett DK, et al., 2019) is: 29%

## 2023-12-30 NOTE — Assessment & Plan Note (Signed)
 Continue Plaquenil.  Management per rheumatology

## 2023-12-30 NOTE — Assessment & Plan Note (Signed)
 Managed with Prozac  40 mg daily and patient is doing well.

## 2023-12-30 NOTE — Assessment & Plan Note (Signed)
 Chronic issues managed well including HTN, HLD, DM.  Continue with low fat diet and exercise as tolerated.

## 2023-12-30 NOTE — Assessment & Plan Note (Signed)
 Patient continuing with trial of propranolol  20 mg up to twice daily which is effective for her

## 2023-12-31 ENCOUNTER — Ambulatory Visit: Payer: Self-pay

## 2023-12-31 LAB — LIPID PANEL
Chol/HDL Ratio: 2.9 ratio (ref 0.0–4.4)
Cholesterol, Total: 147 mg/dL (ref 100–199)
HDL: 50 mg/dL (ref >39)
LDL Chol Calc (NIH): 78 mg/dL (ref 0–99)
Triglycerides: 103 mg/dL (ref 0–149)
VLDL Cholesterol Cal: 19 mg/dL (ref 5–40)

## 2023-12-31 LAB — CBC WITH DIFFERENTIAL/PLATELET
Basophils Absolute: 0 x10E3/uL (ref 0.0–0.2)
Basos: 1 %
EOS (ABSOLUTE): 0.2 x10E3/uL (ref 0.0–0.4)
Eos: 3 %
Hematocrit: 38.7 % (ref 34.0–46.6)
Hemoglobin: 12.7 g/dL (ref 11.1–15.9)
Immature Grans (Abs): 0 x10E3/uL (ref 0.0–0.1)
Immature Granulocytes: 0 %
Lymphocytes Absolute: 2.7 x10E3/uL (ref 0.7–3.1)
Lymphs: 48 %
MCH: 29.7 pg (ref 26.6–33.0)
MCHC: 32.8 g/dL (ref 31.5–35.7)
MCV: 91 fL (ref 79–97)
Monocytes Absolute: 0.5 x10E3/uL (ref 0.1–0.9)
Monocytes: 8 %
Neutrophils Absolute: 2.2 x10E3/uL (ref 1.4–7.0)
Neutrophils: 40 %
Platelets: 226 x10E3/uL (ref 150–450)
RBC: 4.27 x10E6/uL (ref 3.77–5.28)
RDW: 12.3 % (ref 11.7–15.4)
WBC: 5.6 x10E3/uL (ref 3.4–10.8)

## 2023-12-31 LAB — COMPREHENSIVE METABOLIC PANEL WITH GFR
ALT: 20 IU/L (ref 0–32)
AST: 30 IU/L (ref 0–40)
Albumin: 4.2 g/dL (ref 3.8–4.8)
Alkaline Phosphatase: 87 IU/L (ref 49–135)
BUN/Creatinine Ratio: 19 (ref 12–28)
BUN: 26 mg/dL (ref 8–27)
Bilirubin Total: 0.3 mg/dL (ref 0.0–1.2)
CO2: 23 mmol/L (ref 20–29)
Calcium: 9.5 mg/dL (ref 8.7–10.3)
Chloride: 103 mmol/L (ref 96–106)
Creatinine, Ser: 1.35 mg/dL — ABNORMAL HIGH (ref 0.57–1.00)
Globulin, Total: 2.8 g/dL (ref 1.5–4.5)
Glucose: 133 mg/dL — ABNORMAL HIGH (ref 70–99)
Potassium: 4.3 mmol/L (ref 3.5–5.2)
Sodium: 142 mmol/L (ref 134–144)
Total Protein: 7 g/dL (ref 6.0–8.5)
eGFR: 40 mL/min/1.73 — ABNORMAL LOW (ref >59)

## 2024-01-27 ENCOUNTER — Other Ambulatory Visit: Payer: Self-pay | Admitting: Family Medicine

## 2024-01-27 DIAGNOSIS — E1122 Type 2 diabetes mellitus with diabetic chronic kidney disease: Secondary | ICD-10-CM

## 2024-01-30 LAB — OPHTHALMOLOGY REPORT-SCANNED

## 2024-02-01 ENCOUNTER — Ambulatory Visit: Admitting: Dermatology

## 2024-02-01 ENCOUNTER — Telehealth: Payer: Self-pay | Admitting: Pharmacy Technician

## 2024-02-01 ENCOUNTER — Other Ambulatory Visit (HOSPITAL_COMMUNITY): Payer: Self-pay

## 2024-02-01 NOTE — Telephone Encounter (Signed)
 Pharmacy Patient Advocate Encounter   Received notification from Onbase that prior authorization for Farxiga  5mg  tablets is required/requested.   Insurance verification completed.   The patient is insured through [employer].   Per test claim: Refill too soon. PA is not needed at this time. Medication was filled 01/27/2024. Next eligible fill date is 04/04/2024.

## 2024-06-29 ENCOUNTER — Ambulatory Visit
